# Patient Record
Sex: Male | Born: 1935 | Race: White | Hispanic: No | Smoking: Former smoker
Health system: Southern US, Community
[De-identification: ages and names within clinical notes are randomized; demographics above are authoritative.]

## PROBLEM LIST (undated history)

## (undated) DIAGNOSIS — E785 Hyperlipidemia, unspecified: Secondary | ICD-10-CM

## (undated) DIAGNOSIS — I10 Essential (primary) hypertension: Secondary | ICD-10-CM

## (undated) DIAGNOSIS — R5383 Other fatigue: Secondary | ICD-10-CM

## (undated) DIAGNOSIS — H409 Unspecified glaucoma: Secondary | ICD-10-CM

## (undated) DIAGNOSIS — IMO0001 Reserved for inherently not codable concepts without codable children: Secondary | ICD-10-CM

## (undated) DIAGNOSIS — E1165 Type 2 diabetes mellitus with hyperglycemia: Secondary | ICD-10-CM

## (undated) DIAGNOSIS — N4340 Spermatocele of epididymis, unspecified: Secondary | ICD-10-CM

## (undated) DIAGNOSIS — R5381 Other malaise: Secondary | ICD-10-CM

## (undated) DIAGNOSIS — J301 Allergic rhinitis due to pollen: Secondary | ICD-10-CM

## (undated) DIAGNOSIS — R209 Unspecified disturbances of skin sensation: Secondary | ICD-10-CM

## (undated) DIAGNOSIS — R319 Hematuria, unspecified: Secondary | ICD-10-CM

## (undated) DIAGNOSIS — H8110 Benign paroxysmal vertigo, unspecified ear: Secondary | ICD-10-CM

## (undated) DIAGNOSIS — R109 Unspecified abdominal pain: Secondary | ICD-10-CM

## (undated) DIAGNOSIS — R972 Elevated prostate specific antigen [PSA]: Secondary | ICD-10-CM

## (undated) DIAGNOSIS — M25562 Pain in left knee: Secondary | ICD-10-CM

## (undated) DIAGNOSIS — D7589 Other specified diseases of blood and blood-forming organs: Secondary | ICD-10-CM

## (undated) DIAGNOSIS — N529 Male erectile dysfunction, unspecified: Secondary | ICD-10-CM

## (undated) DIAGNOSIS — N400 Enlarged prostate without lower urinary tract symptoms: Secondary | ICD-10-CM

## (undated) HISTORY — DX: Male erectile dysfunction, unspecified: N52.9

## (undated) HISTORY — DX: Essential (primary) hypertension: I10

## (undated) HISTORY — DX: Pain in left knee: M25.562

## (undated) HISTORY — DX: Unspecified disturbances of skin sensation: R20.9

## (undated) HISTORY — DX: Elevated prostate specific antigen (PSA): R97.20

## (undated) HISTORY — DX: Benign paroxysmal vertigo, unspecified ear: H81.10

## (undated) HISTORY — DX: Other malaise: R53.81

## (undated) HISTORY — DX: Reserved for inherently not codable concepts without codable children: IMO0001

## (undated) HISTORY — PX: TONSILLECTOMY: SHX5217

## (undated) HISTORY — DX: Allergic rhinitis due to pollen: J30.1

## (undated) HISTORY — DX: Other specified diseases of blood and blood-forming organs: D75.89

## (undated) HISTORY — DX: Unspecified glaucoma: H40.9

## (undated) HISTORY — DX: Unspecified abdominal pain: R10.9

## (undated) HISTORY — DX: Hematuria, unspecified: R31.9

## (undated) HISTORY — DX: Other malaise: R53.83

## (undated) HISTORY — DX: Spermatocele of epididymis, unspecified: N43.40

## (undated) HISTORY — DX: Benign prostatic hyperplasia without lower urinary tract symptoms: N40.0

## (undated) HISTORY — DX: Hyperlipidemia, unspecified: E78.5

## (undated) HISTORY — DX: Type 2 diabetes mellitus with hyperglycemia: E11.65

---

## 1993-12-20 HISTORY — PX: PROSTATE BIOPSY: SHX241

## 2002-01-03 ENCOUNTER — Ambulatory Visit (HOSPITAL_COMMUNITY): Admission: RE | Admit: 2002-01-03 | Discharge: 2002-01-03 | Payer: Self-pay | Admitting: Gastroenterology

## 2002-01-03 HISTORY — PX: COLONOSCOPY: SHX174

## 2002-01-03 LAB — HM COLONOSCOPY: HM COLON: NORMAL

## 2006-12-20 HISTORY — PX: CATARACT EXTRACTION EXTRACAPSULAR: SHX1305

## 2012-01-03 DIAGNOSIS — I10 Essential (primary) hypertension: Secondary | ICD-10-CM | POA: Diagnosis not present

## 2012-01-03 DIAGNOSIS — E785 Hyperlipidemia, unspecified: Secondary | ICD-10-CM | POA: Diagnosis not present

## 2012-01-03 DIAGNOSIS — R5381 Other malaise: Secondary | ICD-10-CM | POA: Diagnosis not present

## 2012-01-11 DIAGNOSIS — H409 Unspecified glaucoma: Secondary | ICD-10-CM | POA: Diagnosis not present

## 2012-01-11 DIAGNOSIS — H401290 Low-tension glaucoma, unspecified eye, stage unspecified: Secondary | ICD-10-CM | POA: Diagnosis not present

## 2012-01-11 DIAGNOSIS — H01009 Unspecified blepharitis unspecified eye, unspecified eyelid: Secondary | ICD-10-CM | POA: Diagnosis not present

## 2012-07-10 DIAGNOSIS — E785 Hyperlipidemia, unspecified: Secondary | ICD-10-CM | POA: Diagnosis not present

## 2012-07-18 DIAGNOSIS — Z961 Presence of intraocular lens: Secondary | ICD-10-CM | POA: Diagnosis not present

## 2012-07-18 DIAGNOSIS — H40019 Open angle with borderline findings, low risk, unspecified eye: Secondary | ICD-10-CM | POA: Diagnosis not present

## 2012-07-18 DIAGNOSIS — E785 Hyperlipidemia, unspecified: Secondary | ICD-10-CM | POA: Diagnosis not present

## 2012-07-18 DIAGNOSIS — I10 Essential (primary) hypertension: Secondary | ICD-10-CM | POA: Diagnosis not present

## 2012-07-18 DIAGNOSIS — E119 Type 2 diabetes mellitus without complications: Secondary | ICD-10-CM | POA: Diagnosis not present

## 2012-08-15 DIAGNOSIS — H60399 Other infective otitis externa, unspecified ear: Secondary | ICD-10-CM | POA: Diagnosis not present

## 2012-08-15 DIAGNOSIS — H6120 Impacted cerumen, unspecified ear: Secondary | ICD-10-CM | POA: Diagnosis not present

## 2012-10-13 DIAGNOSIS — Z23 Encounter for immunization: Secondary | ICD-10-CM | POA: Diagnosis not present

## 2013-01-01 DIAGNOSIS — I10 Essential (primary) hypertension: Secondary | ICD-10-CM | POA: Diagnosis not present

## 2013-01-01 DIAGNOSIS — E785 Hyperlipidemia, unspecified: Secondary | ICD-10-CM | POA: Diagnosis not present

## 2013-01-09 DIAGNOSIS — E785 Hyperlipidemia, unspecified: Secondary | ICD-10-CM | POA: Diagnosis not present

## 2013-01-09 DIAGNOSIS — N400 Enlarged prostate without lower urinary tract symptoms: Secondary | ICD-10-CM | POA: Diagnosis not present

## 2013-01-09 DIAGNOSIS — I10 Essential (primary) hypertension: Secondary | ICD-10-CM | POA: Diagnosis not present

## 2013-01-30 DIAGNOSIS — Z961 Presence of intraocular lens: Secondary | ICD-10-CM | POA: Diagnosis not present

## 2013-01-30 DIAGNOSIS — H40019 Open angle with borderline findings, low risk, unspecified eye: Secondary | ICD-10-CM | POA: Diagnosis not present

## 2013-01-30 DIAGNOSIS — H4011X0 Primary open-angle glaucoma, stage unspecified: Secondary | ICD-10-CM | POA: Diagnosis not present

## 2013-01-30 DIAGNOSIS — E119 Type 2 diabetes mellitus without complications: Secondary | ICD-10-CM | POA: Diagnosis not present

## 2013-03-30 DIAGNOSIS — H4011X0 Primary open-angle glaucoma, stage unspecified: Secondary | ICD-10-CM | POA: Diagnosis not present

## 2013-03-30 DIAGNOSIS — H40019 Open angle with borderline findings, low risk, unspecified eye: Secondary | ICD-10-CM | POA: Diagnosis not present

## 2013-04-02 DIAGNOSIS — I10 Essential (primary) hypertension: Secondary | ICD-10-CM | POA: Diagnosis not present

## 2013-04-10 ENCOUNTER — Encounter: Payer: Self-pay | Admitting: Internal Medicine

## 2013-04-10 ENCOUNTER — Other Ambulatory Visit: Payer: Self-pay

## 2013-04-10 ENCOUNTER — Non-Acute Institutional Stay: Payer: Medicare Other | Admitting: Internal Medicine

## 2013-04-10 VITALS — BP 124/62 | HR 80 | Ht 66.0 in | Wt 164.0 lb

## 2013-04-10 DIAGNOSIS — H8110 Benign paroxysmal vertigo, unspecified ear: Secondary | ICD-10-CM | POA: Diagnosis not present

## 2013-04-10 DIAGNOSIS — N400 Enlarged prostate without lower urinary tract symptoms: Secondary | ICD-10-CM

## 2013-04-10 DIAGNOSIS — E785 Hyperlipidemia, unspecified: Secondary | ICD-10-CM

## 2013-04-10 DIAGNOSIS — I10 Essential (primary) hypertension: Secondary | ICD-10-CM | POA: Diagnosis not present

## 2013-04-10 DIAGNOSIS — N529 Male erectile dysfunction, unspecified: Secondary | ICD-10-CM

## 2013-04-10 DIAGNOSIS — E119 Type 2 diabetes mellitus without complications: Secondary | ICD-10-CM | POA: Insufficient documentation

## 2013-04-10 MED ORDER — SAXAGLIPTIN HCL 5 MG PO TABS: ORAL_TABLET | ORAL | Status: DC

## 2013-04-10 NOTE — Progress Notes (Signed)
  Subjective:    Patient ID: George [name], male    DOB: [birth year], 79 y.o.   MRN: [id number]  HPI Patient says he forgot to add the Onglyza to his Actos. He is feeling well. He denies any polydipsia or polyuria.  Type II or unspecified type diabetes mellitus without mention of complication, uncontrolled  Control is worse than in the past. There've been no significant dietary changes according to the patient. Other and unspecified hyperlipidemia  Under good control on present medication Benign paroxysmal positional vertigo  Resolved Unspecified essential hypertension  Under good control Hypertrophy of prostate without urinary obstruction and other lower urinary tract symptoms (LUTS)  Denies polyps with starting or stopping urination. No significant nocturia. No dysuria Impotence of organic origin  Unchanged    Review of Systems  Constitutional: Negative for fever, chills, diaphoresis, activity change, appetite change, fatigue and unexpected weight change.  HENT: Positive for congestion and rhinorrhea. Negative for hearing loss, ear pain, nosebleeds, facial swelling, sneezing, drooling, mouth sores, neck pain, neck stiffness, dental problem, sinus pressure, tinnitus and ear discharge.   Eyes:       Prescription lenses, otherwise normal.  Respiratory: Positive for apnea. Negative for cough, choking, chest tightness, shortness of breath, wheezing and stridor.   Cardiovascular: Negative for chest pain, palpitations and leg swelling.  Gastrointestinal: Negative for nausea, abdominal pain, diarrhea, constipation, blood in stool and abdominal distention.  Endocrine:       History diabetes mellitus on oral medications.  Genitourinary: Negative.   Musculoskeletal: Negative.   Skin: Negative.   Neurological: Negative for dizziness, tremors, seizures, speech difficulty, weakness, light-headedness, numbness and headaches.  Hematological: Negative.   Psychiatric/Behavioral: Negative.        Objective:   Physical Exam   Laboratory reports 09/17/2010 HA1C 6.4 PSA 5.31  BMP Glucose 135 Bun 21 Creatinine 0.9   01/22/2011 CBC: WBC 6.2, RBC 4.19, Hemoglobin 13.7 BMP: GLucose 120, BUN 22, Creatinine 0.74 Lipid: Cholesterol 124, Triglycerides 90, HDL 45, VLDL 18,LDL 61 TSH: 2.773 HA1C: 7.0 PSA: 7.62 06/07/2011  CMP: glucose 123, BUN 21, Creatinine 0.71 Lipid: Cholesterol 135, Triglycerides 91, HDL 46, LDL 71 TSH: 1.999 N8G: 7.3 PSA: 3.74 U/A: normal  09/27/2011 BMP: glucose 151, BUN 22, Creatinine 0.78 HgbA1c 7.1 01/03/2012 CMP: glucose 142, BUN 21, Creatinine 0.75 Lipid: cholesterol 121, triglycerides 82, HDL 48, LDL 57 TSH 2.355 HgbA1c 7.1 Microalbumin-urine 1.04 01/01/2013 CMP: Sodium 142, Potassium 4.4, glucose 136, BUN 0.22, Creatinine 0.75 Lipid: cholesterol 144, triglyceride 95, HDL 46, LDL 79  HgbA1c 7.5 Microalbumin 1.20  01/09/13 EKG: rate 71. NSR. 04/02/13 BMP: Normal except glucose 125  Hemoglobin A1c 8.1    Assessment & Plan:  Type II or unspecified type diabetes mellitus without mention of complication, uncontrolled  Resume Onglyza. Continue Actos. Other and unspecified hyperlipidemia  No change in medication Benign paroxysmal positional vertigo  Resolved Unspecified essential hypertension  Well-controlled on current medication Hypertrophy of prostate without urinary obstruction and other lower urinary tract symptoms (LUTS)  No significant symptoms at this time Impotence of organic origin  Chronic issue. No new medications.  Return in 3 months: Office visit, BMP, A1c

## 2013-04-24 DIAGNOSIS — H6120 Impacted cerumen, unspecified ear: Secondary | ICD-10-CM | POA: Diagnosis not present

## 2013-07-10 ENCOUNTER — Encounter: Payer: Self-pay | Admitting: Internal Medicine

## 2013-07-10 ENCOUNTER — Non-Acute Institutional Stay: Payer: Medicare Other | Admitting: Internal Medicine

## 2013-07-10 VITALS — BP 114/58 | HR 72 | Ht 66.0 in | Wt 161.0 lb

## 2013-07-10 DIAGNOSIS — I10 Essential (primary) hypertension: Secondary | ICD-10-CM

## 2013-07-10 DIAGNOSIS — H8110 Benign paroxysmal vertigo, unspecified ear: Secondary | ICD-10-CM | POA: Diagnosis not present

## 2013-07-10 DIAGNOSIS — E785 Hyperlipidemia, unspecified: Secondary | ICD-10-CM

## 2013-07-10 NOTE — Progress Notes (Signed)
Subjective:    Patient ID: George [name], male    DOB: 1924-08-14, 79 y.o.   MRN: [id number]  HPI  Unspecified essential hypertension: controlled  Type II or unspecified type diabetes mellitus without mention of complication, uncontrolled: improved control. A1c fell from 8.1 to 7.1. Toleeratiing meds.  Benign paroxysmal positional vertigo: Last attack about march 2014  Other and unspecified hyperlipidemia: needs recheck prior to next visit    Current Outpatient Prescriptions on File Prior to Visit  Medication Sig Dispense Refill  . aspirin 81 MG tablet Take 81 mg by mouth daily.      . pioglitazone (ACTOS) 45 MG tablet Take 45 mg by mouth daily. To control blood sugar      . saxagliptin HCl (ONGLYZA) 5 MG TABS tablet Take one tablet daily to control diabetes  30 tablet  5  . tadalafil (CIALIS) 20 MG tablet Take 20 mg by mouth. Take one prior to intercouse            Review of Systems  Constitutional: Negative for fever, chills, diaphoresis, activity change, appetite change, fatigue and unexpected weight change.  HENT: Positive for congestion and rhinorrhea. Negative for hearing loss, ear pain, nosebleeds, facial swelling, sneezing, drooling, mouth sores, neck pain, neck stiffness, dental problem, sinus pressure, tinnitus and ear discharge.   Eyes:       Prescription lenses, otherwise normal.  Respiratory: Positive for apnea. Negative for cough, choking, chest tightness, shortness of breath, wheezing and stridor.   Cardiovascular: Negative for chest pain, palpitations and leg swelling.  Gastrointestinal: Negative for nausea, abdominal pain, diarrhea, constipation, blood in stool and abdominal distention.  Endocrine:       History diabetes mellitus on oral medications.  Genitourinary: Negative.   Musculoskeletal: Negative.   Skin: Negative.   Neurological: Negative for dizziness, tremors, seizures, speech difficulty, weakness, light-headedness, numbness and headaches.   Hematological: Negative.   Psychiatric/Behavioral: Negative.        Objective:BP 114/58  Pulse 72  Ht 5\' 6"  (1.676 m)  Wt 161 lb (73.029 kg)  BMI 26 kg/m2    Physical Exam  Constitutional: He is oriented to person, place, and time. He appears well-developed and well-nourished.  HENT:  Head: Normocephalic and atraumatic.  Right Ear: External ear normal.  Left Ear: External ear normal.  Nose: Nose normal.  Eyes:  Corrective lenses. Bilateral lens implants.   Neck: Normal range of motion. Neck supple. No JVD present. No tracheal deviation present. No thyromegaly present.  Cardiovascular: Normal rate, regular rhythm, normal heart sounds and intact distal pulses.  Exam reveals no gallop and no friction rub.   No murmur heard. Pulmonary/Chest: Effort normal and breath sounds normal. No respiratory distress. He has no wheezes. He has no rales. He exhibits no tenderness.  Abdominal: Soft. Bowel sounds are normal. He exhibits no distension and no mass. There is no tenderness.  Musculoskeletal: Normal range of motion. He exhibits no edema and no tenderness.  Lymphadenopathy:    He has no cervical adenopathy.  Neurological: He is alert and oriented to person, place, and time. No cranial nerve deficit. Coordination normal.  Skin: No rash noted. No erythema. No pallor.  Psychiatric: He has a normal mood and affect. His behavior is normal. Judgment and thought content normal.     LAB REVIEW 01/01/2013 CMP: Sodium 142, Potassium 4.4, glucose 136, BUN 0.22, Creatinine 0.75 Lipid: cholesterol 144, triglyceride 95, HDL 46, LDL 79   HgbA1c 7.5 Microalbumin 1.20   01/09/13 EKG: rate  71. NSR. 04/02/13 BMP: Normal except glucose 125             Hemoglobin A1c 8.1 07/02/13 BMP: Glu 127,   A1c 7.1     Assessment & Plan:  Unspecified essential hypertension: controlled  Type II or unspecified type diabetes mellitus without mention of complication, uncontrolled; imporoved  Benign paroxysmal  positional vertigo: no recent attacks  Other and unspecified hyperlipidemia: recheck

## 2013-07-10 NOTE — Patient Instructions (Signed)
Continue current medications. 

## 2013-07-20 ENCOUNTER — Encounter: Payer: Self-pay | Admitting: Internal Medicine

## 2013-08-27 DIAGNOSIS — H6120 Impacted cerumen, unspecified ear: Secondary | ICD-10-CM | POA: Diagnosis not present

## 2013-09-20 DIAGNOSIS — Z23 Encounter for immunization: Secondary | ICD-10-CM | POA: Diagnosis not present

## 2013-10-03 ENCOUNTER — Other Ambulatory Visit: Payer: Self-pay | Admitting: Internal Medicine

## 2013-11-20 DIAGNOSIS — H6120 Impacted cerumen, unspecified ear: Secondary | ICD-10-CM | POA: Diagnosis not present

## 2013-12-26 DIAGNOSIS — H608X9 Other otitis externa, unspecified ear: Secondary | ICD-10-CM | POA: Diagnosis not present

## 2013-12-26 DIAGNOSIS — H6120 Impacted cerumen, unspecified ear: Secondary | ICD-10-CM | POA: Diagnosis not present

## 2014-01-14 DIAGNOSIS — E785 Hyperlipidemia, unspecified: Secondary | ICD-10-CM | POA: Diagnosis not present

## 2014-01-14 DIAGNOSIS — I10 Essential (primary) hypertension: Secondary | ICD-10-CM | POA: Diagnosis not present

## 2014-01-14 DIAGNOSIS — E119 Type 2 diabetes mellitus without complications: Secondary | ICD-10-CM | POA: Diagnosis not present

## 2014-01-14 LAB — HEPATIC FUNCTION PANEL
ALK PHOS: 75 U/L (ref 25–125)
ALT: 10 U/L (ref 10–40)
AST: 15 U/L (ref 14–40)
BILIRUBIN, TOTAL: 1 mg/dL

## 2014-01-14 LAB — BASIC METABOLIC PANEL
BUN: 27 mg/dL — AB (ref 4–21)
CREATININE: 0.7 mg/dL (ref 0.6–1.3)
GLUCOSE: 153 mg/dL
Potassium: 3.9 mmol/L (ref 3.4–5.3)
Sodium: 139 mmol/L (ref 137–147)

## 2014-01-14 LAB — LIPID PANEL
Cholesterol: 128 mg/dL (ref 0–200)
HDL: 49 mg/dL (ref 35–70)
LDL CALC: 58 mg/dL
Triglycerides: 194 mg/dL — AB (ref 40–160)

## 2014-01-22 ENCOUNTER — Encounter: Payer: Self-pay | Admitting: Internal Medicine

## 2014-01-22 ENCOUNTER — Non-Acute Institutional Stay: Payer: Medicare Other | Admitting: Internal Medicine

## 2014-01-22 VITALS — BP 118/66 | HR 80 | Ht 64.0 in | Wt 160.0 lb

## 2014-01-22 DIAGNOSIS — E785 Hyperlipidemia, unspecified: Secondary | ICD-10-CM

## 2014-01-22 DIAGNOSIS — E1165 Type 2 diabetes mellitus with hyperglycemia: Secondary | ICD-10-CM

## 2014-01-22 DIAGNOSIS — I10 Essential (primary) hypertension: Secondary | ICD-10-CM

## 2014-01-22 DIAGNOSIS — N529 Male erectile dysfunction, unspecified: Secondary | ICD-10-CM

## 2014-01-22 DIAGNOSIS — H9190 Unspecified hearing loss, unspecified ear: Secondary | ICD-10-CM | POA: Diagnosis not present

## 2014-01-22 DIAGNOSIS — IMO0001 Reserved for inherently not codable concepts without codable children: Secondary | ICD-10-CM | POA: Diagnosis not present

## 2014-01-22 DIAGNOSIS — N400 Enlarged prostate without lower urinary tract symptoms: Secondary | ICD-10-CM

## 2014-01-22 MED ORDER — SAXAGLIPTIN HCL 5 MG PO TABS: ORAL_TABLET | ORAL | Status: DC

## 2014-01-22 NOTE — Progress Notes (Signed)
Patient ID: George [name], male   DOB: June 14, 1924, 80 y.o.   MRN: [id number]    Nursing Home Location:  [place] of Service: Clinic (12)  PCP: Estill Dooms, MD  Code Status: LIVING WILL  Allergies  Allergen Reactions  . Metformin And Related   . Sulfa Antibiotics   . Tetanus Toxoids   . Typhoid Vaccines     Chief Complaint  Patient presents with  . Medical Managment of Chronic Issues    Comprehensive Exam: blood pressure, blood sugar, cholesterol     HPI:  Unspecified essential hypertension: controlled  Type II or unspecified type diabetes mellitus without mention of complication, uncontrolled: poor control  Deaf: bilateral loss of hearing  Other and unspecified hyperlipidemia:controlled  Impotence of organic origin: improved. No longer needs to use Viagra.  Hypertrophy of prostate without urinary obstruction and other lower urinary tract symptoms (LUTS): asymptomatic      Past Medical History  Diagnosis Date  . Type II or unspecified type diabetes mellitus without mention of complication, uncontrolled   . Other and unspecified hyperlipidemia   . Unspecified glaucoma   . Benign paroxysmal positional vertigo   . Unspecified essential hypertension   . Allergic rhinitis due to pollen   . Hematuria, unspecified   . Hypertrophy of prostate without urinary obstruction and other lower urinary tract symptoms (LUTS)   . Impotence of organic origin   . Spermatocele     bilateral  . Other malaise and fatigue   . Disturbance of skin sensation     left great toe  . Abdominal pain, other specified site   . Elevated prostate specific antigen (PSA)     Past Surgical History  Procedure Laterality Date  . Tonsillectomy    . Cataract extraction extracapsular  2008    bilateraly Dr. Katy Fitch  . Colonoscopy  01/03/2002    normal Dr. Earlean Shawl  . Prostate biopsy  1995    due to elevated PSA normal    CONSULTANTS Ophth: [facility] GI: [facility] Urol:  [facility] Derm: [place] ENT: [place]  PAST PROCEDURES 1995 Prostate biopsy due to elevated PSA: normal result 1996 Flex sigmoidoscopy: nl 01/03/02 Colonoscopy ([facility]): normal  Social History: History   Social History  . Marital Status: Single    Spouse Name: N/A    Number of Children: N/A  . Years of Education: N/A   Occupational History  . retired, self employed    Social History Main Topics  . Smoking status: Former Smoker    Quit date: 04/09/1948  . Smokeless tobacco: Never Used  . Alcohol Use: No  . Drug Use: No  . Sexual Activity: Yes   Other Topics Concern  . None   Social History Narrative   [facility] at [facility]    Married    Living Will    Family History Family Status  Relation Status Death Age  . Mother Deceased 67    unknown  . Father Deceased 43  . Brother Deceased   . Daughter Alive   . Son Deceased 48-   Family History  Problem Relation Age of Onset  . Cancer Father     lung  . Cancer Brother     adrenal gland  . Cancer Son     liver     Medications: Patient's Medications  New Prescriptions   No medications on file  Previous Medications   ASPIRIN 81 MG TABLET    Take 81 mg by mouth  daily.   PIOGLITAZONE (ACTOS) 45 MG TABLET    TAKE 1 TABLET BY MOUTH EVERY DAY TO CONTROL BLOOD SUGAR   SAXAGLIPTIN HCL (ONGLYZA) 5 MG TABS TABLET    Take one tablet daily to control diabetes   TADALAFIL (CIALIS) 20 MG TABLET    Take 20 mg by mouth. Take one prior to intercouse  Modified Medications   No medications on file  Discontinued Medications   No medications on file    Immunization History  Administered Date(s) Administered  . Influenza Whole 09/19/2012, 09/20/2013  . Pneumococcal Polysaccharide-23 10/24/2001     Review of Systems  Constitutional: Negative for fever, chills, diaphoresis, activity change, appetite change, fatigue and unexpected weight change.  HENT: Positive for congestion and rhinorrhea. Negative for dental problem,  drooling, ear discharge, ear pain, facial swelling, hearing loss, mouth sores, nosebleeds, sinus pressure, sneezing and tinnitus.   Eyes:       Prescription lenses, otherwise normal.  Respiratory: Positive for apnea. Negative for cough, choking, chest tightness, shortness of breath, wheezing and stridor.   Cardiovascular: Negative for chest pain, palpitations and leg swelling.  Gastrointestinal: Negative for nausea, abdominal pain, diarrhea, constipation, blood in stool and abdominal distention.  Endocrine:       History diabetes mellitus on oral medications.  Genitourinary: Negative.   Musculoskeletal: Negative.  Negative for neck pain and neck stiffness.  Skin: Negative.   Neurological: Negative for dizziness, tremors, seizures, speech difficulty, weakness, light-headedness, numbness and headaches.  Hematological: Negative.   Psychiatric/Behavioral: Negative.       Filed Vitals:   01/22/14 1044  BP: 118/66  Pulse: 80  Height: 5\' 4"  (1.626 m)  Weight: 160 lb (72.576 kg)   Physical Exam  Constitutional: He is oriented to person, place, and time. He appears well-developed and well-nourished. No distress.  HENT:  Right Ear: External ear normal.  Left Ear: External ear normal.  Nose: Nose normal.  Eyes: Conjunctivae and EOM are normal. Pupils are equal, round, and reactive to light.  Corrective lens and bilateral implants.  Neck: No JVD present. No tracheal deviation present. No thyromegaly present.  Cardiovascular: Normal rate, regular rhythm, normal heart sounds and intact distal pulses.  Exam reveals no gallop and no friction rub.   No murmur heard. Respiratory: No respiratory distress. He has no wheezes. He has no rales. He exhibits no tenderness.  GI: He exhibits no distension and no mass. There is no tenderness.  Genitourinary: Rectum normal, prostate normal and penis normal. Guaiac negative stool.  Musculoskeletal: Normal range of motion. He exhibits no edema and no  tenderness.  Lymphadenopathy:    He has no cervical adenopathy.  Neurological: He is alert and oriented to person, place, and time. He has normal reflexes. No cranial nerve deficit.  Skin: Skin is warm and dry. No rash noted. No erythema. No pallor.  Psychiatric: He has a normal mood and affect. His behavior is normal. Judgment and thought content normal.       Labs reviewed: Nursing Home on 01/22/2014  Component Date Value Range Status  . HM Colonoscopy 01/03/2002 normal Dr. Earlean Shawl   Final  . Glucose 01/14/2014 153   Final  . BUN 01/14/2014 27* 4 - 21 mg/dL Final  . Creatinine 01/14/2014 0.7  0.6 - 1.3 mg/dL Final  . Potassium 01/14/2014 3.9  3.4 - 5.3 mmol/L Final  . Sodium 01/14/2014 139  137 - 147 mmol/L Final  . Triglycerides 01/14/2014 194* 40 - 160 mg/dL Final  . Cholesterol  01/14/2014 128  0 - 200 mg/dL Final  . HDL 01/14/2014 49  35 - 70 mg/dL Final  . LDL Cholesterol 01/14/2014 58   Final  . Alkaline Phosphatase 01/14/2014 75  25 - 125 U/L Final  . ALT 01/14/2014 10  10 - 40 U/L Final  . AST 01/14/2014 15  14 - 40 U/L Final  . Bilirubin, Total 01/14/2014 1.0   Final      Assessment/Plan 1. Unspecified essential hypertension controlled  2. Type II or unspecified type diabetes mellitus without mention of complication, uncontrolled Poor control. Resume Onglyza. Continue Actos. - saxagliptin HCl (ONGLYZA) 5 MG TABS tablet; Take one tablet daily to control diabetes  Dispense: 90 tablet; Refill: 3  3. Deaf Continue hearing aids  4. Other and unspecified hyperlipidemia controlled  5. Impotence of organic origin improved  6. Hypertrophy of prostate without urinary obstruction and other lower urinary tract symptoms (LUTS) asymptomatic

## 2014-02-13 ENCOUNTER — Encounter: Payer: Self-pay | Admitting: Internal Medicine

## 2014-05-20 DIAGNOSIS — IMO0001 Reserved for inherently not codable concepts without codable children: Secondary | ICD-10-CM | POA: Diagnosis not present

## 2014-05-20 LAB — BASIC METABOLIC PANEL
BUN: 19 mg/dL (ref 4–21)
Creatinine: 0.7 mg/dL (ref 0.6–1.3)
Glucose: 136 mg/dL
Potassium: 4.2 mmol/L (ref 3.4–5.3)
SODIUM: 138 mmol/L (ref 137–147)

## 2014-05-20 LAB — HEMOGLOBIN A1C: Hgb A1c MFr Bld: 7.4 % — AB (ref 4.0–6.0)

## 2014-05-28 ENCOUNTER — Encounter: Payer: Self-pay | Admitting: Internal Medicine

## 2014-05-28 ENCOUNTER — Non-Acute Institutional Stay: Payer: Medicare Other | Admitting: Internal Medicine

## 2014-05-28 VITALS — BP 124/64 | HR 76 | Wt 159.0 lb

## 2014-05-28 DIAGNOSIS — E1165 Type 2 diabetes mellitus with hyperglycemia: Principal | ICD-10-CM

## 2014-05-28 DIAGNOSIS — H8110 Benign paroxysmal vertigo, unspecified ear: Secondary | ICD-10-CM | POA: Diagnosis not present

## 2014-05-28 DIAGNOSIS — I10 Essential (primary) hypertension: Secondary | ICD-10-CM

## 2014-05-28 DIAGNOSIS — IMO0001 Reserved for inherently not codable concepts without codable children: Secondary | ICD-10-CM | POA: Diagnosis not present

## 2014-05-28 DIAGNOSIS — M25569 Pain in unspecified knee: Secondary | ICD-10-CM

## 2014-05-28 DIAGNOSIS — M25562 Pain in left knee: Secondary | ICD-10-CM

## 2014-05-28 DIAGNOSIS — E785 Hyperlipidemia, unspecified: Secondary | ICD-10-CM

## 2014-05-28 DIAGNOSIS — N529 Male erectile dysfunction, unspecified: Secondary | ICD-10-CM

## 2014-05-28 HISTORY — DX: Pain in left knee: M25.562

## 2014-05-28 MED ORDER — NAPROXEN SODIUM 220 MG PO TABS: ORAL_TABLET | ORAL | Status: DC

## 2014-05-28 MED ORDER — SITAGLIPTIN PHOSPHATE 50 MG PO TABS: ORAL_TABLET | ORAL | Status: DC

## 2014-05-28 MED ORDER — TADALAFIL 20 MG PO TABS: ORAL_TABLET | ORAL | Status: DC

## 2014-05-28 NOTE — Progress Notes (Signed)
Patient ID: George [name], male   DOB: 01-12-1924, 80 y.o.   MRN: [id number]    Location:  [facility]   Place of Service: Clinic (12)    Allergies  Allergen Reactions  . Metformin And Related   . Sulfa Antibiotics   . Tetanus Toxoids   . Typhoid Vaccines     Chief Complaint  Patient presents with  . Medical Management of Chronic Issues    blood pressure, blood sugar. c/o feels light headed, feels blood sugar unstable, doesn't check BS at home afraid to. Been out of Onglyza for a month.  . Medication Refill    would like Rx for Cialis 20mg  again.    HPI:  Type II or unspecified type diabetes mellitus without mention of complication, uncontrolled: not using Onglyza as directed. Possible trouble getting approval from insurance complay  Other and unspecified hyperlipidemia: controlled  Unspecified essential hypertension: controlled  Benign paroxysmal positional vertigo: some lightheaded dizziness. No true vertigo. No nausea, change in vision , or other neurologic disturbance.  Left knee pain noted about 3 months ago. Sometimes hurts at night. When the right knee rests on it there is discomfort that wakes him up. Has not tried any OTC pain relievers.  Medications: Patient's Medications  New Prescriptions   No medications on file  Previous Medications   ASPIRIN 81 MG TABLET    Take 81 mg by mouth daily.   PIOGLITAZONE (ACTOS) 45 MG TABLET    TAKE 1 TABLET BY MOUTH EVERY DAY TO CONTROL BLOOD SUGAR   SAXAGLIPTIN HCL (ONGLYZA) 5 MG TABS TABLET    Take one tablet daily to control diabetes  Modified Medications   No medications on file  Discontinued Medications   No medications on file     Review of Systems  Constitutional: Negative for fever, chills, diaphoresis, activity change, appetite change, fatigue and unexpected weight change.  HENT: Negative for congestion, dental problem, drooling, ear discharge, ear pain, facial swelling, hearing loss, mouth sores,  nosebleeds, rhinorrhea, sinus pressure, sneezing and tinnitus.   Eyes:       Prescription lenses, otherwise normal.  Respiratory: Negative for apnea, cough, choking, chest tightness, shortness of breath, wheezing and stridor.   Cardiovascular: Negative for chest pain, palpitations and leg swelling.  Gastrointestinal: Negative for nausea, abdominal pain, diarrhea, constipation, blood in stool and abdominal distention.  Endocrine:       History diabetes mellitus on oral medications.  Genitourinary: Negative.   Musculoskeletal: Negative.  Negative for neck pain and neck stiffness.  Skin: Negative.   Neurological: Negative for dizziness, tremors, seizures, speech difficulty, weakness, light-headedness, numbness and headaches.  Hematological: Negative.   Psychiatric/Behavioral: Negative.     Filed Vitals:   05/28/14 0845  BP: 124/64  Pulse: 76  Weight: 159 lb (72.122 kg)   Body mass index is 27.28 kg/(m^2).  Physical Exam  Constitutional: He is oriented to person, place, and time. He appears well-developed and well-nourished.  HENT:  Head: Normocephalic and atraumatic.  Right Ear: External ear normal.  Left Ear: External ear normal.  Nose: Nose normal.  Eyes:  Corrective lenses. Bilateral lens implants.   Neck: Normal range of motion. Neck supple. No JVD present. No tracheal deviation present. No thyromegaly present.  Cardiovascular: Normal rate, regular rhythm, normal heart sounds and intact distal pulses.  Exam reveals no gallop and no friction rub.   No murmur heard. Pulmonary/Chest: Effort normal and breath sounds normal. No respiratory distress. He has no wheezes. He has  no rales. He exhibits no tenderness.  Abdominal: Soft. Bowel sounds are normal. He exhibits no distension and no mass. There is no tenderness.  Musculoskeletal: Normal range of motion. He exhibits no edema and no tenderness.  Lymphadenopathy:    He has no cervical adenopathy.  Neurological: He is alert and  oriented to person, place, and time. No cranial nerve deficit. Coordination normal.  Skin: No rash noted. No erythema. No pallor.  Psychiatric: He has a normal mood and affect. His behavior is normal. Judgment and thought content normal.     Labs reviewed: Nursing Home on 05/28/2014  Component Date Value Ref Range Status  . Glucose 05/20/2014 136   Final  . BUN 05/20/2014 19  4 - 21 mg/dL Final  . Creatinine 05/20/2014 0.7  0.6 - 1.3 mg/dL Final  . Potassium 05/20/2014 4.2  3.4 - 5.3 mmol/L Final  . Sodium 05/20/2014 138  137 - 147 mmol/L Final  . Hemoglobin A1C 05/20/2014 7.4* 4.0 - 6.0 % Final      Assessment/Plan  1. Type II or unspecified type diabetes mellitus without mention of complication, uncontrolled Continue Actos. Add- sitaGLIPtin (JANUVIA) 50 MG tablet; One daily to control diabetes  Dispense: 90 tablet; Refill: 3  2. Other and unspecified hyperlipidemia controlled  3. Unspecified essential hypertension controlled  4. Benign paroxysmal positional vertigo No true vertigo. Lightheaded feeling.  5. Impotence of organic origin - tadalafil (CIALIS) 20 MG tablet; One tablet prior to intercourse  Dispense: 6 tablet; Refill: 5  6. Left knee pain - naproxen sodium (ALEVE) 220 MG tablet; One tablet up to 3 times daily for knee pains  Dispense: 100 tablet; Refill: 0

## 2014-06-04 ENCOUNTER — Encounter: Payer: Self-pay | Admitting: Internal Medicine

## 2014-07-17 ENCOUNTER — Encounter: Payer: Self-pay | Admitting: Internal Medicine

## 2014-07-17 DIAGNOSIS — H4011X0 Primary open-angle glaucoma, stage unspecified: Secondary | ICD-10-CM | POA: Diagnosis not present

## 2014-07-17 DIAGNOSIS — Z961 Presence of intraocular lens: Secondary | ICD-10-CM | POA: Diagnosis not present

## 2014-07-17 DIAGNOSIS — E119 Type 2 diabetes mellitus without complications: Secondary | ICD-10-CM | POA: Diagnosis not present

## 2014-07-17 DIAGNOSIS — H40019 Open angle with borderline findings, low risk, unspecified eye: Secondary | ICD-10-CM | POA: Diagnosis not present

## 2014-07-17 LAB — HM DIABETES EYE EXAM

## 2014-09-30 DIAGNOSIS — I10 Essential (primary) hypertension: Secondary | ICD-10-CM | POA: Diagnosis not present

## 2014-09-30 DIAGNOSIS — E785 Hyperlipidemia, unspecified: Secondary | ICD-10-CM | POA: Diagnosis not present

## 2014-09-30 DIAGNOSIS — E119 Type 2 diabetes mellitus without complications: Secondary | ICD-10-CM | POA: Diagnosis not present

## 2014-09-30 LAB — BASIC METABOLIC PANEL
BUN: 20 mg/dL (ref 4–21)
CREATININE: 0.7 mg/dL (ref 0.6–1.3)
Glucose: 172 mg/dL
Potassium: 4.3 mmol/L (ref 3.4–5.3)
Sodium: 140 mmol/L (ref 137–147)

## 2014-09-30 LAB — LIPID PANEL
Cholesterol: 123 mg/dL (ref 0–200)
HDL: 47 mg/dL (ref 35–70)
LDL CALC: 59 mg/dL
LDL/HDL RATIO: 2.6
TRIGLYCERIDES: 87 mg/dL (ref 40–160)

## 2014-09-30 LAB — HEMOGLOBIN A1C: HEMOGLOBIN A1C: 7.4 % — AB (ref 4.0–6.0)

## 2014-10-05 DIAGNOSIS — Z23 Encounter for immunization: Secondary | ICD-10-CM | POA: Diagnosis not present

## 2014-10-08 ENCOUNTER — Non-Acute Institutional Stay: Payer: Medicare Other | Admitting: Internal Medicine

## 2014-10-08 ENCOUNTER — Encounter: Payer: Self-pay | Admitting: Internal Medicine

## 2014-10-08 VITALS — BP 128/68 | HR 80 | Temp 97.7°F | Wt 161.0 lb

## 2014-10-08 DIAGNOSIS — I10 Essential (primary) hypertension: Secondary | ICD-10-CM

## 2014-10-08 DIAGNOSIS — E119 Type 2 diabetes mellitus without complications: Secondary | ICD-10-CM | POA: Diagnosis not present

## 2014-10-08 MED ORDER — SITAGLIPTIN PHOSPHATE 100 MG PO TABS: ORAL_TABLET | ORAL | Status: DC

## 2014-10-08 NOTE — Progress Notes (Signed)
Patient ID: George [name], male   DOB: 1924/08/06, 80 y.o.   MRN: [id number]    [place] of Service: Clinic (12)    Allergies  Allergen Reactions  . Metformin And Related   . Sulfa Antibiotics   . Tetanus Toxoids   . Typhoid Vaccines     Chief Complaint  Patient presents with  . Medical Management of Chronic Issues    blood sugar, blood pressure, cholesterol.  Does not check blood sugars at home.    HPI:  Type 2 diabetes mellitus without complication: glucose running high. Asymptomatic.  HTN: Controlled  Sometimes has a lightheaded feeling. "Dizzy". Not vertigenous.    Medications: Patient's Medications  New Prescriptions   No medications on file  Previous Medications   ASPIRIN 81 MG TABLET    Take 81 mg by mouth daily.   PIOGLITAZONE (ACTOS) 45 MG TABLET    TAKE 1 TABLET BY MOUTH EVERY DAY TO CONTROL BLOOD SUGAR   SITAGLIPTIN (JANUVIA) 50 MG TABLET    One daily to control diabetes   TADALAFIL (CIALIS) 20 MG TABLET    One tablet prior to intercourse  Modified Medications   No medications on file  Discontinued Medications   NAPROXEN SODIUM (ALEVE) 220 MG TABLET    One tablet up to 3 times daily for knee pains     Review of Systems  Constitutional: Negative for fever, chills, diaphoresis, activity change, appetite change, fatigue and unexpected weight change.  HENT: Negative for congestion, dental problem, drooling, ear discharge, ear pain, facial swelling, hearing loss, mouth sores, nosebleeds, rhinorrhea, sinus pressure, sneezing and tinnitus.   Eyes:       Prescription lenses, otherwise normal.  Respiratory: Negative for apnea, cough, choking, chest tightness, shortness of breath, wheezing and stridor.   Cardiovascular: Negative for chest pain, palpitations and leg swelling.  Gastrointestinal: Negative for nausea, abdominal pain, diarrhea, constipation, blood in stool and abdominal distention.  Endocrine:       History  diabetes mellitus on oral medications.  Genitourinary: Negative.   Musculoskeletal: Negative.  Negative for neck pain and neck stiffness.  Skin: Negative.   Neurological: Negative for dizziness, tremors, seizures, speech difficulty, weakness, light-headedness, numbness and headaches.  Hematological: Negative.   Psychiatric/Behavioral: Negative.     Filed Vitals:   10/08/14 0840  BP: 128/68  Pulse: 80  Temp: 97.7 F (36.5 C)  TempSrc: Oral  Weight: 161 lb (73.029 kg)   Body mass index is 27.62 kg/(m^2).  Physical Exam  Constitutional: He is oriented to person, place, and time. He appears well-developed and well-nourished.  HENT:  Head: Normocephalic and atraumatic.  Right Ear: External ear normal.  Left Ear: External ear normal.  Nose: Nose normal.  Eyes:  Corrective lenses. Bilateral lens implants.   Neck: Normal range of motion. Neck supple. No JVD present. No tracheal deviation present. No thyromegaly present.  Cardiovascular: Normal rate, regular rhythm, normal heart sounds and intact distal pulses.  Exam reveals no gallop and no friction rub.   No murmur heard. Pulmonary/Chest: Effort normal and breath sounds normal. No respiratory distress. He has no wheezes. He has no rales. He exhibits no tenderness.  Abdominal: Soft. Bowel sounds are normal. He exhibits no distension and no mass. There is no tenderness.  Musculoskeletal: Normal range of motion. He exhibits no edema and no tenderness.  Lymphadenopathy:    He has no cervical adenopathy.  Neurological: He is alert and oriented to person, place, and  time. No cranial nerve deficit. Coordination normal.  Skin: No rash noted. No erythema. No pallor.  Psychiatric: He has a normal mood and affect. His behavior is normal. Judgment and thought content normal.     Labs reviewed: Nursing Home on 10/08/2014  Component Date Value Ref Range Status  . Glucose 09/30/2014 172   Final  . BUN 09/30/2014 20  4 - 21 mg/dL Final  .  Creatinine 09/30/2014 0.7  0.6 - 1.3 mg/dL Final  . Potassium 09/30/2014 4.3  3.4 - 5.3 mmol/L Final  . Sodium 09/30/2014 140  137 - 147 mmol/L Final  . LDl/HDL Ratio 09/30/2014 2.6   Final  . Triglycerides 09/30/2014 87  40 - 160 mg/dL Final  . Cholesterol 09/30/2014 123  0 - 200 mg/dL Final  . HDL 09/30/2014 47  35 - 70 mg/dL Final  . LDL Cholesterol 09/30/2014 59   Final  . Hemoglobin A1C 09/30/2014 7.4* 4.0 - 6.0 % Final     Assessment/Plan  1. Type 2 diabetes mellitus without complication - sitaGLIPtin (JANUVIA) 100 MG tablet; One each morning to control diabetes  Dispense: 90 tablet; Refill: 3  2. Essential hypertension controlled

## 2014-10-18 ENCOUNTER — Encounter: Payer: Self-pay | Admitting: Internal Medicine

## 2014-11-07 ENCOUNTER — Other Ambulatory Visit: Payer: Self-pay | Admitting: Nurse Practitioner

## 2014-11-08 DIAGNOSIS — Z85828 Personal history of other malignant neoplasm of skin: Secondary | ICD-10-CM | POA: Diagnosis not present

## 2014-11-08 DIAGNOSIS — D485 Neoplasm of uncertain behavior of skin: Secondary | ICD-10-CM | POA: Diagnosis not present

## 2014-11-08 DIAGNOSIS — L820 Inflamed seborrheic keratosis: Secondary | ICD-10-CM | POA: Diagnosis not present

## 2014-12-30 DIAGNOSIS — H40013 Open angle with borderline findings, low risk, bilateral: Secondary | ICD-10-CM | POA: Diagnosis not present

## 2014-12-30 DIAGNOSIS — E119 Type 2 diabetes mellitus without complications: Secondary | ICD-10-CM | POA: Diagnosis not present

## 2014-12-30 DIAGNOSIS — Z961 Presence of intraocular lens: Secondary | ICD-10-CM | POA: Diagnosis not present

## 2014-12-30 LAB — HM DIABETES EYE EXAM

## 2015-01-20 DIAGNOSIS — E785 Hyperlipidemia, unspecified: Secondary | ICD-10-CM | POA: Diagnosis not present

## 2015-01-20 DIAGNOSIS — E119 Type 2 diabetes mellitus without complications: Secondary | ICD-10-CM | POA: Diagnosis not present

## 2015-01-20 LAB — BASIC METABOLIC PANEL
BUN: 23 mg/dL — AB (ref 4–21)
Creatinine: 0.8 mg/dL (ref 0.6–1.3)
Glucose: 118 mg/dL
POTASSIUM: 4.2 mmol/L (ref 3.4–5.3)
SODIUM: 141 mmol/L (ref 137–147)

## 2015-01-20 LAB — LIPID PANEL
Cholesterol: 137 mg/dL (ref 0–200)
HDL: 50 mg/dL (ref 35–70)
LDL CALC: 71 mg/dL
LDl/HDL Ratio: 2.7
Triglycerides: 80 mg/dL (ref 40–160)

## 2015-01-23 ENCOUNTER — Encounter: Payer: Self-pay | Admitting: *Deleted

## 2015-01-28 ENCOUNTER — Non-Acute Institutional Stay: Payer: Medicare Other | Admitting: Internal Medicine

## 2015-01-28 ENCOUNTER — Encounter: Payer: Self-pay | Admitting: Internal Medicine

## 2015-01-28 VITALS — BP 124/60 | HR 72 | Temp 97.7°F | Wt 160.0 lb

## 2015-01-28 DIAGNOSIS — E785 Hyperlipidemia, unspecified: Secondary | ICD-10-CM

## 2015-01-28 DIAGNOSIS — E119 Type 2 diabetes mellitus without complications: Secondary | ICD-10-CM

## 2015-01-28 DIAGNOSIS — M25562 Pain in left knee: Secondary | ICD-10-CM | POA: Diagnosis not present

## 2015-01-28 DIAGNOSIS — I10 Essential (primary) hypertension: Secondary | ICD-10-CM | POA: Diagnosis not present

## 2015-01-28 DIAGNOSIS — N400 Enlarged prostate without lower urinary tract symptoms: Secondary | ICD-10-CM

## 2015-01-28 NOTE — Progress Notes (Signed)
[facility]    [facility] ([facility] OFFICE   Allergies  Allergen Reactions  . Metformin And Related   . Sulfa Antibiotics   . Tetanus Toxoids   . Typhoid Vaccines     Chief Complaint  Patient presents with  . Medical Management of Chronic Issues    blood pressure, blood sugar    HPI:  Type 2 diabetes mellitus without complication: controlled  Hyperlipidemia: controlled  Essential hypertension: controlled  BPH (benign prostatic hyperplasia): Denies dysuria or frequency  Left knee pain: Mild and not disabling    Medications: Patient's Medications  New Prescriptions   No medications on file  Previous Medications   ASPIRIN 81 MG TABLET    Take 81 mg by mouth daily.   LUTEIN-ZEAXANTHIN 25-5 MG CAPS    Take by mouth. Take one tablet daily, eye  vitamin   PIOGLITAZONE (ACTOS) 45 MG TABLET    TAKE 1 TABLET BY MOUTH EVERY DAY TO CONTROL BLOOD SUGAR   SELENIUM 200 MCG TABS    Take by mouth. Take one tablet daily   SITAGLIPTIN (JANUVIA) 100 MG TABLET    One each morning to control diabetes   TADALAFIL (CIALIS) 20 MG TABLET    One tablet prior to intercourse  Modified Medications   No medications on file  Discontinued Medications   No medications on file     Review of Systems  Constitutional: Negative for fever, chills, diaphoresis, activity change, appetite change, fatigue and unexpected weight change.  HENT: Negative for congestion, dental problem, drooling, ear discharge, ear pain, facial swelling, hearing loss, mouth sores, nosebleeds, rhinorrhea, sinus pressure, sneezing and tinnitus.   Eyes:       Prescription lenses, otherwise normal.  Respiratory: Negative for apnea, cough, choking, chest tightness, shortness of breath, wheezing and stridor.   Cardiovascular: Negative for chest pain, palpitations and leg swelling.  Gastrointestinal: Negative for nausea, abdominal pain, diarrhea, constipation, blood in stool and abdominal  distention.  Endocrine:       History diabetes mellitus on oral medications.  Genitourinary: Negative.   Musculoskeletal: Negative.  Negative for neck pain and neck stiffness.  Skin: Negative.   Neurological: Negative for dizziness, tremors, seizures, speech difficulty, weakness, light-headedness, numbness and headaches.  Hematological: Negative.   Psychiatric/Behavioral: Negative.     Filed Vitals:   01/28/15 0900  BP: 124/60  Pulse: 72  Temp: 97.7 F (36.5 C)  TempSrc: Oral  Weight: 160 lb (72.576 kg)   Body mass index is 27.45 kg/(m^2).  Physical Exam  Constitutional: He is oriented to person, place, and time. He appears well-developed and well-nourished.  HENT:  Head: Normocephalic and atraumatic.  Right Ear: External ear normal.  Left Ear: External ear normal.  Nose: Nose normal.  Eyes:  Corrective lenses. Bilateral lens implants.  Neck: Normal range of motion. Neck supple. No JVD present. No tracheal deviation present. No thyromegaly present.  Cardiovascular: Normal rate, regular rhythm, normal heart sounds and intact distal pulses.  Exam reveals no gallop and no friction rub.   No murmur heard. Pulmonary/Chest: Effort normal and breath sounds normal. No respiratory distress. He has no wheezes. He has no rales. He exhibits no tenderness.  Abdominal: Soft. Bowel sounds are normal. He exhibits no distension and no mass. There is no tenderness.  Musculoskeletal: Normal range of motion. He exhibits no edema or tenderness.  No swelling, focal tenderness, or joint effusion at the left knee. No popliteal cyst.  Lymphadenopathy:  He has no cervical adenopathy.  Neurological: He is alert and oriented to person, place, and time. No cranial nerve deficit. Coordination normal.  Skin: No rash noted. No erythema. No pallor.  Psychiatric: He has a normal mood and affect. His behavior is normal. Judgment and thought content normal.     Labs reviewed: Nursing Home on 01/28/2015    Component Date Value Ref Range Status  . Glucose 01/20/2015 118   Final  . BUN 01/20/2015 23* 4 - 21 mg/dL Final  . Creatinine 01/20/2015 0.8  0.6 - 1.3 mg/dL Final  . Potassium 01/20/2015 4.2  3.4 - 5.3 mmol/L Final  . Sodium 01/20/2015 141  137 - 147 mmol/L Final  . LDl/HDL Ratio 01/20/2015 2.7   Final  . Triglycerides 01/20/2015 80  40 - 160 mg/dL Final  . Cholesterol 01/20/2015 137  0 - 200 mg/dL Final  . HDL 01/20/2015 50  35 - 70 mg/dL Final  . LDL Cholesterol 01/20/2015 71   Final  Abstract on 01/23/2015  Component Date Value Ref Range Status  . HM Diabetic Eye Exam 12/30/2014 No Retinopathy  No Retinopathy Final     Assessment/Plan  1. Type 2 diabetes mellitus without complication -P5W, BMP, URINE MICROALBUMIN  2. Hyperlipidemia -LIPIDS  3. Essential hypertension BMP  4. BPH (benign prostatic hyperplasia) stable  5. Left knee pain Unchanged.

## 2015-03-11 ENCOUNTER — Encounter: Payer: Self-pay | Admitting: Internal Medicine

## 2015-03-28 DIAGNOSIS — L570 Actinic keratosis: Secondary | ICD-10-CM | POA: Diagnosis not present

## 2015-03-28 DIAGNOSIS — Z85828 Personal history of other malignant neoplasm of skin: Secondary | ICD-10-CM | POA: Diagnosis not present

## 2015-03-28 DIAGNOSIS — L821 Other seborrheic keratosis: Secondary | ICD-10-CM | POA: Diagnosis not present

## 2015-07-01 ENCOUNTER — Non-Acute Institutional Stay: Payer: Medicare Other | Admitting: Internal Medicine

## 2015-07-01 ENCOUNTER — Encounter: Payer: Self-pay | Admitting: Internal Medicine

## 2015-07-01 VITALS — BP 120/62 | HR 66 | Temp 97.5°F | Wt 162.0 lb

## 2015-07-01 DIAGNOSIS — M25561 Pain in right knee: Secondary | ICD-10-CM | POA: Diagnosis not present

## 2015-07-01 MED ORDER — CELECOXIB 200 MG PO CAPS: ORAL_CAPSULE | ORAL | Status: DC

## 2015-07-01 NOTE — Progress Notes (Signed)
Patient ID: George [name], male   DOB: 1924/05/17, 81 y.o.   MRN: [id number]    [facility]     Place of Service: Clinic (12)     Allergies  Allergen Reactions  . Metformin And Related   . Sulfa Antibiotics   . Tetanus Toxoids   . Typhoid Vaccines     Chief Complaint  Patient presents with  . Knee Pain    right knee for one week, no injury. Worse at night, wakes him up. Hasn't try anything for the pain    HPI:  Right knee pain. No fall or other injury. No swelling.. Denies crepitance or knee catching or giving way as he walks. Hurts when in bed. Slightly limping as he walks.  Medications: Patient's Medications  New Prescriptions   No medications on file  Previous Medications   ASPIRIN 81 MG TABLET    Take 81 mg by mouth daily.   LUTEIN-ZEAXANTHIN 25-5 MG CAPS    Take by mouth. Take one tablet daily, eye  vitamin   PIOGLITAZONE (ACTOS) 45 MG TABLET    TAKE 1 TABLET BY MOUTH EVERY DAY TO CONTROL BLOOD SUGAR   SELENIUM 200 MCG TABS    Take by mouth. Take one tablet daily   SITAGLIPTIN (JANUVIA) 100 MG TABLET    One each morning to control diabetes   TADALAFIL (CIALIS) 20 MG TABLET    One tablet prior to intercourse  Modified Medications   No medications on file  Discontinued Medications   No medications on file     Review of Systems  Constitutional: Negative for fever, chills, diaphoresis, activity change, appetite change, fatigue and unexpected weight change.  HENT: Negative for congestion, dental problem, drooling, ear discharge, ear pain, facial swelling, hearing loss, mouth sores, nosebleeds, rhinorrhea, sinus pressure, sneezing and tinnitus.   Eyes:       Prescription lenses, otherwise normal.  Respiratory: Negative for apnea, cough, choking, chest tightness, shortness of breath, wheezing and stridor.   Cardiovascular: Negative for chest pain, palpitations and leg swelling.  Gastrointestinal: Negative for nausea, abdominal pain, diarrhea,  constipation, blood in stool and abdominal distention.  Endocrine:       History diabetes mellitus on oral medications.  Genitourinary: Negative.   Musculoskeletal: Negative for neck pain and neck stiffness.       Pain right knee medially. No swelling of the joint.  Skin: Negative.   Neurological: Negative for dizziness, tremors, seizures, speech difficulty, weakness, light-headedness, numbness and headaches.  Hematological: Negative.   Psychiatric/Behavioral: Negative.     Filed Vitals:   07/01/15 0850  BP: 120/62  Pulse: 66  Temp: 97.5 F (36.4 C)  TempSrc: Oral  Weight: 162 lb (73.483 kg)  SpO2: 97%   Body mass index is 27.79 kg/(m^2).  Physical Exam  Constitutional: He is oriented to person, place, and time. He appears well-developed and well-nourished.  HENT:  Head: Normocephalic and atraumatic.  Right Ear: External ear normal.  Left Ear: External ear normal.  Nose: Nose normal.  Eyes:  Corrective lenses. Bilateral lens implants.  Neck: Normal range of motion. Neck supple. No JVD present. No tracheal deviation present. No thyromegaly present.  Cardiovascular: Normal rate, regular rhythm, normal heart sounds and intact distal pulses.  Exam reveals no gallop and no friction rub.   No murmur heard. Pulmonary/Chest: Effort normal and breath sounds normal. No respiratory distress. He has no wheezes. He has no rales. He exhibits no tenderness.  Abdominal: Soft. Bowel sounds are  normal. He exhibits no distension and no mass. There is no tenderness.  Musculoskeletal: Normal range of motion. He exhibits no edema or tenderness.  Tender medially on the right knee. No swelling, popliteal cyst, or laxity when I stress the joint.  Lymphadenopathy:    He has no cervical adenopathy.  Neurological: He is alert and oriented to person, place, and time. No cranial nerve deficit. Coordination normal.  Skin: No rash noted. No erythema. No pallor.  Psychiatric: He has a normal mood and  affect. His behavior is normal. Judgment and thought content normal.     Labs reviewed: No visits with results within 3 Month(s) from this visit. Latest known visit with results is:  Nursing Home on 01/28/2015  Component Date Value Ref Range Status  . Glucose 01/20/2015 118   Final  . BUN 01/20/2015 23* 4 - 21 mg/dL Final  . Creatinine 01/20/2015 0.8  0.6 - 1.3 mg/dL Final  . Potassium 01/20/2015 4.2  3.4 - 5.3 mmol/L Final  . Sodium 01/20/2015 141  137 - 147 mmol/L Final  . LDl/HDL Ratio 01/20/2015 2.7   Final  . Triglycerides 01/20/2015 80  40 - 160 mg/dL Final  . Cholesterol 01/20/2015 137  0 - 200 mg/dL Final  . HDL 01/20/2015 50  35 - 70 mg/dL Final  . LDL Cholesterol 01/20/2015 71   Final     Assessment/Plan  1. Right knee pain - celecoxib (CELEBREX) 200 MG capsule; One daily to help knee pain  Dispense: 30 capsule; Refill: 3

## 2015-07-21 DIAGNOSIS — E785 Hyperlipidemia, unspecified: Secondary | ICD-10-CM | POA: Diagnosis not present

## 2015-07-21 DIAGNOSIS — I10 Essential (primary) hypertension: Secondary | ICD-10-CM | POA: Diagnosis not present

## 2015-07-21 DIAGNOSIS — E119 Type 2 diabetes mellitus without complications: Secondary | ICD-10-CM | POA: Diagnosis not present

## 2015-07-21 LAB — BASIC METABOLIC PANEL
BUN: 18 mg/dL (ref 4–21)
Creatinine: 0.8 mg/dL (ref <=1.3)
GLUCOSE: 133 mg/dL
POTASSIUM: 4.1 mmol/L (ref 3.4–5.3)
Sodium: 141 mmol/L (ref 137–147)

## 2015-07-21 LAB — HEMOGLOBIN A1C: Hgb A1c MFr Bld: 7.2 % — AB (ref 4.0–6.0)

## 2015-07-21 LAB — LIPID PANEL
Cholesterol: 131 mg/dL (ref 0–200)
HDL: 49 mg/dL (ref 35–70)
LDL Cholesterol: 62 mg/dL
LDl/HDL Ratio: 2.7
TRIGLYCERIDES: 98 mg/dL (ref 40–160)

## 2015-07-22 ENCOUNTER — Non-Acute Institutional Stay: Payer: Medicare Other | Admitting: Internal Medicine

## 2015-07-22 ENCOUNTER — Encounter: Payer: Self-pay | Admitting: Internal Medicine

## 2015-07-22 VITALS — BP 110/62 | HR 76 | Temp 97.7°F | Resp 20 | Ht 64.0 in | Wt 162.0 lb

## 2015-07-22 DIAGNOSIS — M25561 Pain in right knee: Secondary | ICD-10-CM

## 2015-07-22 DIAGNOSIS — E119 Type 2 diabetes mellitus without complications: Secondary | ICD-10-CM | POA: Diagnosis not present

## 2015-07-22 DIAGNOSIS — E785 Hyperlipidemia, unspecified: Secondary | ICD-10-CM | POA: Diagnosis not present

## 2015-07-22 DIAGNOSIS — I10 Essential (primary) hypertension: Secondary | ICD-10-CM

## 2015-07-22 NOTE — Progress Notes (Signed)
Patient ID: George [name], male   DOB: [birth year], 81 y.o.   MRN: [id number]    [facility]     Place of Service: Clinic (12)     Allergies  Allergen Reactions  . Metformin And Related   . Sulfa Antibiotics   . Tetanus Toxoids   . Typhoid Vaccines     Chief Complaint  Patient presents with  . Medical Management of Chronic Issues    HPI:  Type 2 diabetes mellitus without complication: Hemoglobin A1c stable at 7.2. No episodes of hypoglycemia or significant hyperglycemia.  Hyperlipidemia - adequately controlled  Right knee pain - improved  Essential hypertension - controlled    Medications: Patient's Medications  New Prescriptions   No medications on file  Previous Medications   ASPIRIN 81 MG TABLET    Take 81 mg by mouth daily.   CELECOXIB (CELEBREX) 200 MG CAPSULE    One daily to help knee pain   LUTEIN-ZEAXANTHIN 25-5 MG CAPS    Take by mouth. Take one tablet daily, eye  vitamin   PIOGLITAZONE (ACTOS) 45 MG TABLET    TAKE 1 TABLET BY MOUTH EVERY DAY TO CONTROL BLOOD SUGAR   SELENIUM 200 MCG TABS    Take by mouth. Take one tablet daily   SITAGLIPTIN (JANUVIA) 100 MG TABLET    One each morning to control diabetes   TADALAFIL (CIALIS) 20 MG TABLET    One tablet prior to intercourse  Modified Medications   No medications on file  Discontinued Medications   No medications on file     Review of Systems  Constitutional: Negative for fever, chills, diaphoresis, activity change, appetite change, fatigue and unexpected weight change.  HENT: Negative for congestion, dental problem, drooling, ear discharge, ear pain, facial swelling, hearing loss, mouth sores, nosebleeds, rhinorrhea, sinus pressure, sneezing and tinnitus.   Eyes:       Prescription lenses, otherwise normal.  Respiratory: Negative for apnea, cough, choking, chest tightness, shortness of breath, wheezing and stridor.   Cardiovascular: Negative for chest pain, palpitations and leg swelling.    Gastrointestinal: Negative for nausea, abdominal pain, diarrhea, constipation, blood in stool and abdominal distention.  Endocrine:       History diabetes mellitus on oral medications.  Genitourinary: Negative.   Musculoskeletal: Negative for neck pain and neck stiffness.  Skin: Negative.   Neurological: Negative for dizziness, tremors, seizures, speech difficulty, weakness, light-headedness, numbness and headaches.  Hematological: Negative.   Psychiatric/Behavioral: The patient is hyperactive.     Filed Vitals:   07/22/15 0845  BP: 110/62  Pulse: 76  Temp: 97.7 F (36.5 C)  TempSrc: Oral  Resp: 20  Height: 5\' 4"  (1.626 m)  Weight: 162 lb (73.483 kg)  SpO2: 95%   Body mass index is 27.79 kg/(m^2).  Physical Exam  Constitutional: He is oriented to person, place, and time. He appears well-developed and well-nourished.  HENT:  Head: Normocephalic and atraumatic.  Right Ear: External ear normal.  Left Ear: External ear normal.  Nose: Nose normal.  Eyes:  Corrective lenses. Bilateral lens implants.  Neck: Normal range of motion. Neck supple. No JVD present. No tracheal deviation present. No thyromegaly present.  Cardiovascular: Normal rate, regular rhythm, normal heart sounds and intact distal pulses.  Exam reveals no gallop and no friction rub.   No murmur heard. Pulmonary/Chest: Effort normal and breath sounds normal. No respiratory distress. He has no wheezes. He has no rales. He exhibits no tenderness.  Abdominal: Soft. Bowel sounds  are normal. He exhibits no distension and no mass. There is no tenderness.  Musculoskeletal: Normal range of motion. He exhibits no edema or tenderness.  Lymphadenopathy:    He has no cervical adenopathy.  Neurological: He is alert and oriented to person, place, and time. No cranial nerve deficit. Coordination normal.  Skin: No rash noted. No erythema. No pallor.  Psychiatric: He has a normal mood and affect. His behavior is normal. Judgment  and thought content normal.     Labs reviewed: Nursing Home on 07/22/2015  Component Date Value Ref Range Status  . Glucose 07/21/2015 133   Final  . BUN 07/21/2015 18  4 - 21 mg/dL Final  . Creatinine 07/21/2015 0.8  .6 - 1.3 mg/dL Final  . Potassium 07/21/2015 4.1  3.4 - 5.3 mmol/L Final  . Sodium 07/21/2015 141  137 - 147 mmol/L Final  . LDl/HDL Ratio 07/21/2015 2.7   Final  . Triglycerides 07/21/2015 98  40 - 160 mg/dL Final  . Cholesterol 07/21/2015 131  0 - 200 mg/dL Final  . HDL 07/21/2015 49  35 - 70 mg/dL Final  . LDL Cholesterol 07/21/2015 62   Final  . Hgb A1c MFr Bld 07/21/2015 7.2* 4.0 - 6.0 % Final     Assessment/Plan  1. Type 2 diabetes mellitus without complication Continue current medication  2. Hyperlipidemia Continue to monitor  3. Right knee pain Improved, pain-free today  4. Essential hypertension Controlled. Continue current medication.

## 2015-07-29 ENCOUNTER — Encounter: Payer: Self-pay | Admitting: Internal Medicine

## 2015-08-08 NOTE — Progress Notes (Signed)
This encounter was created in error - please disregard.

## 2015-09-18 DIAGNOSIS — Z23 Encounter for immunization: Secondary | ICD-10-CM | POA: Diagnosis not present

## 2015-11-19 ENCOUNTER — Other Ambulatory Visit: Payer: Self-pay | Admitting: Internal Medicine

## 2016-01-01 ENCOUNTER — Encounter: Payer: Self-pay | Admitting: *Deleted

## 2016-01-01 DIAGNOSIS — H02831 Dermatochalasis of right upper eyelid: Secondary | ICD-10-CM | POA: Diagnosis not present

## 2016-01-01 DIAGNOSIS — E119 Type 2 diabetes mellitus without complications: Secondary | ICD-10-CM | POA: Diagnosis not present

## 2016-01-01 DIAGNOSIS — H02834 Dermatochalasis of left upper eyelid: Secondary | ICD-10-CM | POA: Diagnosis not present

## 2016-01-01 DIAGNOSIS — H40012 Open angle with borderline findings, low risk, left eye: Secondary | ICD-10-CM | POA: Diagnosis not present

## 2016-01-01 DIAGNOSIS — H401111 Primary open-angle glaucoma, right eye, mild stage: Secondary | ICD-10-CM | POA: Diagnosis not present

## 2016-01-01 DIAGNOSIS — Z961 Presence of intraocular lens: Secondary | ICD-10-CM | POA: Diagnosis not present

## 2016-01-01 DIAGNOSIS — H26493 Other secondary cataract, bilateral: Secondary | ICD-10-CM | POA: Diagnosis not present

## 2016-01-01 LAB — HM DIABETES EYE EXAM

## 2016-01-22 DIAGNOSIS — E785 Hyperlipidemia, unspecified: Secondary | ICD-10-CM | POA: Diagnosis not present

## 2016-01-22 DIAGNOSIS — I10 Essential (primary) hypertension: Secondary | ICD-10-CM | POA: Diagnosis not present

## 2016-01-22 LAB — LIPID PANEL
Cholesterol: 128 mg/dL (ref 0–200)
HDL: 49 mg/dL (ref 35–70)
LDL Cholesterol: 62 mg/dL
TRIGLYCERIDES: 86 mg/dL (ref 40–160)

## 2016-01-22 LAB — HEPATIC FUNCTION PANEL
ALK PHOS: 63 U/L (ref 25–125)
ALT: 14 U/L (ref 10–40)
AST: 20 U/L (ref 14–40)
BILIRUBIN, TOTAL: 0.7 mg/dL

## 2016-01-22 LAB — BASIC METABOLIC PANEL
BUN: 22 mg/dL — AB (ref 4–21)
Creatinine: 0.8 mg/dL (ref 0.6–1.3)
Glucose: 130 mg/dL
Potassium: 3.8 mmol/L (ref 3.4–5.3)
Sodium: 143 mmol/L (ref 137–147)

## 2016-01-23 ENCOUNTER — Other Ambulatory Visit: Payer: Self-pay

## 2016-01-27 ENCOUNTER — Non-Acute Institutional Stay: Payer: Medicare Other | Admitting: Internal Medicine

## 2016-01-27 VITALS — BP 122/60 | HR 70 | Temp 98.1°F | Resp 20 | Ht 64.0 in | Wt 165.0 lb

## 2016-01-27 DIAGNOSIS — Z794 Long term (current) use of insulin: Secondary | ICD-10-CM

## 2016-01-27 DIAGNOSIS — I10 Essential (primary) hypertension: Secondary | ICD-10-CM

## 2016-01-27 DIAGNOSIS — E785 Hyperlipidemia, unspecified: Secondary | ICD-10-CM

## 2016-01-27 DIAGNOSIS — E119 Type 2 diabetes mellitus without complications: Secondary | ICD-10-CM | POA: Diagnosis not present

## 2016-01-27 MED ORDER — SITAGLIPTIN PHOSPHATE 100 MG PO TABS: ORAL_TABLET | ORAL | Status: DC

## 2016-01-27 NOTE — Progress Notes (Signed)
Patient ID: George [name], male   DOB: 02/17/1924, 82 y.o.   MRN: [id number]    [facility]     Place of Service: Clinic (12)     Allergies  Allergen Reactions  . Metformin And Related   . Sulfa Antibiotics   . Tetanus Toxoids   . Typhoid Vaccines     Chief Complaint  Patient presents with  . Medical Management of Chronic Issues    6 mos f/u     HPI:  Type 2 diabetes mellitus without complication, with long-term current use of insulin ([place]) - Plan: sitaGLIPtin (JANUVIA) 100 MG tablet  Essential hypertension  Hyperlipidemia    Medications: Patient's Medications  New Prescriptions   No medications on file  Previous Medications   ASPIRIN 81 MG TABLET    Take 81 mg by mouth daily.   LUTEIN-ZEAXANTHIN 25-5 MG CAPS    Take by mouth. Take one tablet daily, eye  vitamin   PIOGLITAZONE (ACTOS) 45 MG TABLET    TAKE 1 TABLET BY MOUTH EVERY DAY TO CONTROL BLOOD SUGAR   SELENIUM 200 MCG TABS    Take by mouth. Take one tablet daily   SITAGLIPTIN (JANUVIA) 100 MG TABLET    One each morning to control diabetes  Modified Medications   No medications on file  Discontinued Medications   CELECOXIB (CELEBREX) 200 MG CAPSULE    One daily to help knee pain   TADALAFIL (CIALIS) 20 MG TABLET    One tablet prior to intercourse     Review of Systems  Constitutional: Negative for fever, chills, diaphoresis, activity change, appetite change, fatigue and unexpected weight change.  HENT: Negative for congestion, dental problem, drooling, ear discharge, ear pain, facial swelling, hearing loss, mouth sores, nosebleeds, rhinorrhea, sinus pressure, sneezing and tinnitus.   Eyes:       Prescription lenses, otherwise normal.  Respiratory: Negative for apnea, cough, choking, chest tightness, shortness of breath, wheezing and stridor.   Cardiovascular: Negative for chest pain, palpitations and leg swelling.  Gastrointestinal: Negative for nausea, abdominal pain, diarrhea,  constipation, blood in stool and abdominal distention.  Endocrine:       History diabetes mellitus on oral medications.  Genitourinary: Negative.   Musculoskeletal: Negative for neck pain and neck stiffness.  Skin: Negative.   Neurological: Negative for dizziness, tremors, seizures, speech difficulty, weakness, light-headedness, numbness and headaches.  Hematological: Negative.   Psychiatric/Behavioral: The patient is hyperactive.     Filed Vitals:   01/27/16 0847  BP: 122/60  Pulse: 70  Temp: 98.1 F (36.7 C)  TempSrc: Oral  Resp: 20  Height: '5\' 4"'  (1.626 m)  Weight: 165 lb (74.844 kg)  SpO2: 97%   Wt Readings from Last 3 Encounters:  01/27/16 165 lb (74.844 kg)  07/22/15 162 lb (73.483 kg)  07/01/15 162 lb (73.483 kg)    Body mass index is 28.31 kg/(m^2).  Physical Exam  Constitutional: He is oriented to person, place, and time. He appears well-developed and well-nourished.  HENT:  Head: Normocephalic and atraumatic.  Right Ear: External ear normal.  Left Ear: External ear normal.  Nose: Nose normal.  Eyes:  Corrective lenses. Bilateral lens implants.  Neck: Normal range of motion. Neck supple. No JVD present. No tracheal deviation present. No thyromegaly present.  Cardiovascular: Normal rate, regular rhythm, normal heart sounds and intact distal pulses.  Exam reveals no gallop and no friction rub.   No murmur heard. Pulmonary/Chest: Effort normal and breath sounds normal. No respiratory  distress. He has no wheezes. He has no rales. He exhibits no tenderness.  Abdominal: Soft. Bowel sounds are normal. He exhibits no distension and no mass. There is no tenderness.  Musculoskeletal: Normal range of motion. He exhibits no edema or tenderness.  Lymphadenopathy:    He has no cervical adenopathy.  Neurological: He is alert and oriented to person, place, and time. No cranial nerve deficit. Coordination normal.  Intact vibratory and monofilament testing  Skin: No rash  noted. No erythema. No pallor.  Psychiatric: He has a normal mood and affect. His behavior is normal. Judgment and thought content normal.     Labs reviewed: Lab Summary Latest Ref Rng 01/22/2016 07/21/2015 01/20/2015 09/30/2014  Hemoglobin - (None) (None) (None) (None)  Hematocrit - (None) (None) (None) (None)  White count - (None) (None) (None) (None)  Platelet count - (None) (None) (None) (None)  Sodium 137 - 147 mmol/L 143 141 141 140  Potassium 3.4 - 5.3 mmol/L 3.8 4.1 4.2 4.3  Calcium - (None) (None) (None) (None)  Phosphorus - (None) (None) (None) (None)  Creatinine 0.6 - 1.3 mg/dL 0.8 0.8 0.8 0.7  AST 14 - 40 U/L 20 (None) (None) (None)  Alk Phos 25 - 125 U/L 63 (None) (None) (None)  Bilirubin - (None) (None) (None) (None)  Glucose - 130 133 118 172  Cholesterol 0 - 200 mg/dL 128 131 137 123  HDL cholesterol 35 - 70 mg/dL 49 49 50 47  Triglycerides 40 - 160 mg/dL 86 98 80 87  LDL Direct - (None) (None) (None) (None)  LDL Calc - 62 62 71 59  Total protein - (None) (None) (None) (None)  Albumin - (None) (None) (None) (None)   No results found for: TSH Lab Results  Component Value Date   BUN 22* 01/22/2016   BUN 18 07/21/2015   BUN 23* 01/20/2015   Lab Results  Component Value Date   CREATININE 0.8 01/22/2016   CREATININE 0.8 07/21/2015   CREATININE 0.8 01/20/2015   Lab Results  Component Value Date   HGBA1C 7.2* 07/21/2015   HGBA1C 7.4* 09/30/2014   HGBA1C 7.4* 05/20/2014    Assessment/Plan  1. Type 2 diabetes mellitus without complication, with long-term current use of insulin ([facility]CC) - sitaGLIPtin (JANUVIA) 100 MG tablet; One each morning to control diabetes  Dispense: 90 tablet; Refill: 3 -A1c, CMP  2. Essential hypertension -CMP  3. Hyperlipidemia -lipids

## 2016-07-22 ENCOUNTER — Encounter: Payer: Self-pay | Admitting: Internal Medicine

## 2016-07-22 DIAGNOSIS — E785 Hyperlipidemia, unspecified: Secondary | ICD-10-CM | POA: Diagnosis not present

## 2016-07-22 DIAGNOSIS — I10 Essential (primary) hypertension: Secondary | ICD-10-CM | POA: Diagnosis not present

## 2016-07-22 DIAGNOSIS — E119 Type 2 diabetes mellitus without complications: Secondary | ICD-10-CM | POA: Diagnosis not present

## 2016-07-22 LAB — MICROALBUMIN, URINE: Microalb, Ur: 0.6

## 2016-07-22 LAB — HEPATIC FUNCTION PANEL
ALT: 9 U/L — AB (ref 10–40)
AST: 15 U/L (ref 14–40)
Alkaline Phosphatase: 72 U/L (ref 25–125)
BILIRUBIN, TOTAL: 0.8 mg/dL

## 2016-07-22 LAB — LIPID PANEL
Cholesterol: 127 mg/dL (ref 0–200)
HDL: 55 mg/dL (ref 35–70)
LDL Cholesterol: 53 mg/dL
Triglycerides: 93 mg/dL (ref 40–160)

## 2016-07-22 LAB — BASIC METABOLIC PANEL
BUN: 15 mg/dL (ref 4–21)
CREATININE: 0.8 mg/dL (ref 0.6–1.3)
GLUCOSE: 143 mg/dL
POTASSIUM: 4.2 mmol/L (ref 3.4–5.3)
SODIUM: 142 mmol/L (ref 137–147)

## 2016-07-22 LAB — HEMOGLOBIN A1C: HEMOGLOBIN A1C: 7.5

## 2016-07-23 ENCOUNTER — Other Ambulatory Visit: Payer: Self-pay

## 2016-07-27 ENCOUNTER — Encounter: Payer: Self-pay | Admitting: Internal Medicine

## 2016-07-27 ENCOUNTER — Non-Acute Institutional Stay: Payer: Medicare Other | Admitting: Internal Medicine

## 2016-07-27 VITALS — BP 138/68 | HR 73 | Temp 97.9°F | Ht 64.0 in | Wt 159.0 lb

## 2016-07-27 DIAGNOSIS — E119 Type 2 diabetes mellitus without complications: Secondary | ICD-10-CM

## 2016-07-27 DIAGNOSIS — E785 Hyperlipidemia, unspecified: Secondary | ICD-10-CM

## 2016-07-27 DIAGNOSIS — I10 Essential (primary) hypertension: Secondary | ICD-10-CM | POA: Diagnosis not present

## 2016-07-27 NOTE — Progress Notes (Signed)
Facility      Place of Service: Clinic (12)     Allergies  Allergen Reactions  . Metformin And Related   . Sulfa Antibiotics   . Tetanus Toxoids   . Typhoid Vaccines     Chief Complaint  Patient presents with  . Medical Management of Chronic Issues    6 month medication management blood sugar, blood pressure, cholesterol  . Back Pain    for couple of weeks, right mid-back when he lays down.  . Dizziness    after he eats    HPI:  Nocturnal pain in the right iliac crest area. Not able t feel anything or demonstarte localized tenderness.  Dizzy feelings pass quickly.  Eating more bread with tomato sandwiches. Weight dow 6#.  BP and lipids controlled  Medications: Patient's Medications  New Prescriptions   No medications on file  Previous Medications   ASPIRIN 81 MG TABLET    Take 81 mg by mouth daily.   PIOGLITAZONE (ACTOS) 45 MG TABLET    TAKE 1 TABLET BY MOUTH EVERY DAY TO CONTROL BLOOD SUGAR   SELENIUM 200 MCG TABS    Take by mouth. Take one tablet daily   SITAGLIPTIN (JANUVIA) 100 MG TABLET    One each morning to control diabetes  Modified Medications   No medications on file  Discontinued Medications   LUTEIN-ZEAXANTHIN 25-5 MG CAPS    Take by mouth. Take one tablet daily, eye  vitamin     Review of Systems  Constitutional: Negative for activity change, appetite change, chills, diaphoresis, fatigue, fever and unexpected weight change.  HENT: Negative for congestion, dental problem, drooling, ear discharge, ear pain, facial swelling, hearing loss, mouth sores, nosebleeds, rhinorrhea, sinus pressure, sneezing and tinnitus.   Eyes:       Prescription lenses, otherwise normal.  Respiratory: Negative for apnea, cough, choking, chest tightness, shortness of breath, wheezing and stridor.   Cardiovascular: Negative for chest pain, palpitations and leg swelling.  Gastrointestinal: Negative for abdominal distention, abdominal pain, blood in stool, constipation,  diarrhea and nausea.  Endocrine:       History diabetes mellitus on oral medications.  Genitourinary: Negative.  Negative for enuresis, frequency, hematuria and urgency.  Musculoskeletal: Negative for neck pain and neck stiffness.       Pain in the right iliac crest area at night.  Skin: Negative.   Neurological: Negative for dizziness, tremors, seizures, speech difficulty, weakness, light-headedness, numbness and headaches.  Hematological: Negative.   Psychiatric/Behavioral: The patient is hyperactive.     Vitals:   07/27/16 0840  BP: 138/68  Pulse: 73  Temp: 97.9 F (36.6 C)  TempSrc: Oral  SpO2: 98%  Weight: 159 lb (72.1 kg)  Height: '5\' 4"'$  (1.626 m)   Wt Readings from Last 3 Encounters:  07/27/16 159 lb (72.1 kg)  01/27/16 165 lb (74.8 kg)  07/22/15 162 lb (73.5 kg)    Body mass index is 27.29 kg/m.  Physical Exam  Constitutional: He is oriented to person, place, and time. He appears well-developed and well-nourished.  HENT:  Head: Normocephalic and atraumatic.  Right Ear: External ear normal.  Left Ear: External ear normal.  Nose: Nose normal.  Eyes:  Corrective lenses. Bilateral lens implants.  Neck: Normal range of motion. Neck supple. No JVD present. No tracheal deviation present. No thyromegaly present.  Cardiovascular: Normal rate, regular rhythm, normal heart sounds and intact distal pulses.  Exam reveals no gallop and no friction rub.   No murmur  heard. Pulmonary/Chest: Effort normal and breath sounds normal. No respiratory distress. He has no wheezes. He has no rales. He exhibits no tenderness.  Abdominal: Soft. Bowel sounds are normal. He exhibits no distension and no mass. There is no tenderness.  Musculoskeletal: Normal range of motion. He exhibits no edema or tenderness.  Unable to demonstrate any focal tenderness in the right iliac crest or lower back  Lymphadenopathy:    He has no cervical adenopathy.  Neurological: He is alert and oriented to  person, place, and time. No cranial nerve deficit. Coordination normal.  Intact vibratory and monofilament testing  Skin: No rash noted. No erythema. No pallor.  Psychiatric: He has a normal mood and affect. His behavior is normal. Judgment and thought content normal.     Labs reviewed: Lab Summary Latest Ref Rng & Units 07/22/2016 01/22/2016 07/21/2015 01/20/2015  Hemoglobin - (None) (None) (None) (None)  Hematocrit - (None) (None) (None) (None)  White count - (None) (None) (None) (None)  Platelet count - (None) (None) (None) (None)  Sodium 137 - 147 mmol/L 142 143 141 141  Potassium 3.4 - 5.3 mmol/L 4.2 3.8 4.1 4.2  Calcium - (None) (None) (None) (None)  Phosphorus - (None) (None) (None) (None)  Creatinine 0.6 - 1.3 mg/dL 0.8 0.8 0.8 0.8  AST 14 - 40 U/L 15 20 (None) (None)  Alk Phos 25 - 125 U/L 72 63 (None) (None)  Bilirubin - (None) (None) (None) (None)  Glucose mg/dL 143 130 133 118  Cholesterol 0 - 200 mg/dL 127 128 131 137  HDL cholesterol 35 - 70 mg/dL 55 49 49 50  Triglycerides 40 - 160 mg/dL 93 86 98 80  LDL Direct - (None) (None) (None) (None)  LDL Calc mg/dL 53 62 62 71  Total protein - (None) (None) (None) (None)  Albumin - (None) (None) (None) (None)  Some recent data might be hidden   No results found for: TSH Lab Results  Component Value Date   BUN 15 07/22/2016   BUN 22 (A) 01/22/2016   BUN 18 07/21/2015   Lab Results  Component Value Date   CREATININE 0.8 07/22/2016   CREATININE 0.8 01/22/2016   CREATININE 0.8 07/21/2015   Lab Results  Component Value Date   HGBA1C 7.5 07/22/2016   HGBA1C 7.2 (A) 07/21/2015   HGBA1C 7.4 (A) 09/30/2014       Assessment/Plan  1. Type 2 diabetes mellitus without complication, without long-term current use of insulin ([facility]CC) Emphasized dietary compliance. Continue current meds. A1c, future  2. Essential hypertension Controlled CMP, future  3. Hyperlipidemia controlled

## 2016-08-29 ENCOUNTER — Encounter (HOSPITAL_COMMUNITY): Payer: Self-pay | Admitting: Emergency Medicine

## 2016-08-29 ENCOUNTER — Emergency Department (HOSPITAL_COMMUNITY)
Admission: EM | Admit: 2016-08-29 | Discharge: 2016-08-29 | Disposition: A | Discharge status: Home / Self Care | Payer: Medicare Other | Attending: Emergency Medicine | Admitting: Emergency Medicine

## 2016-08-29 DIAGNOSIS — E119 Type 2 diabetes mellitus without complications: Secondary | ICD-10-CM | POA: Insufficient documentation

## 2016-08-29 DIAGNOSIS — I10 Essential (primary) hypertension: Secondary | ICD-10-CM | POA: Insufficient documentation

## 2016-08-29 DIAGNOSIS — R310 Gross hematuria: Secondary | ICD-10-CM | POA: Diagnosis not present

## 2016-08-29 DIAGNOSIS — Z7982 Long term (current) use of aspirin: Secondary | ICD-10-CM | POA: Insufficient documentation

## 2016-08-29 DIAGNOSIS — R319 Hematuria, unspecified: Secondary | ICD-10-CM

## 2016-08-29 DIAGNOSIS — Z87891 Personal history of nicotine dependence: Secondary | ICD-10-CM | POA: Diagnosis not present

## 2016-08-29 DIAGNOSIS — Z7984 Long term (current) use of oral hypoglycemic drugs: Secondary | ICD-10-CM | POA: Insufficient documentation

## 2016-08-29 LAB — URINE MICROSCOPIC-ADD ON
SQUAMOUS EPITHELIAL / LPF: NONE SEEN
WBC, UA: NONE SEEN WBC/hpf (ref 0–5)

## 2016-08-29 LAB — CBC WITH DIFFERENTIAL/PLATELET
BASOS ABS: 0 10*3/uL (ref 0.0–0.1)
BASOS PCT: 1 %
Eosinophils Absolute: 0.1 10*3/uL (ref 0.0–0.7)
Eosinophils Relative: 1 %
HEMATOCRIT: 39.5 % (ref 39.0–52.0)
HEMOGLOBIN: 13.3 g/dL (ref 13.0–17.0)
Lymphocytes Relative: 22 %
Lymphs Abs: 1.3 10*3/uL (ref 0.7–4.0)
MCH: 33.4 pg (ref 26.0–34.0)
MCHC: 33.7 g/dL (ref 30.0–36.0)
MCV: 99.2 fL (ref 78.0–100.0)
MONOS PCT: 12 %
Monocytes Absolute: 0.7 10*3/uL (ref 0.1–1.0)
NEUTROS ABS: 3.8 10*3/uL (ref 1.7–7.7)
NEUTROS PCT: 64 %
PLATELETS: 281 10*3/uL (ref 150–400)
RBC: 3.98 MIL/uL — ABNORMAL LOW (ref 4.22–5.81)
RDW: 17 % — AB (ref 11.5–15.5)
WBC: 5.8 10*3/uL (ref 4.0–10.5)

## 2016-08-29 LAB — BASIC METABOLIC PANEL
ANION GAP: 6 (ref 5–15)
BUN: 22 mg/dL — ABNORMAL HIGH (ref 6–20)
CALCIUM: 8.7 mg/dL — AB (ref 8.9–10.3)
CHLORIDE: 104 mmol/L (ref 101–111)
CO2: 28 mmol/L (ref 22–32)
Creatinine, Ser: 0.72 mg/dL (ref 0.61–1.24)
GFR calc non Af Amer: >60 mL/min (ref >60)
Glucose, Bld: 208 mg/dL — ABNORMAL HIGH (ref 65–99)
Potassium: 3.6 mmol/L (ref 3.5–5.1)
Sodium: 138 mmol/L (ref 135–145)

## 2016-08-29 LAB — URINALYSIS, ROUTINE W REFLEX MICROSCOPIC
BILIRUBIN URINE: NEGATIVE
GLUCOSE, UA: NEGATIVE mg/dL
Ketones, ur: NEGATIVE mg/dL
Leukocytes, UA: NEGATIVE
Nitrite: NEGATIVE
PH: 6 (ref 5.0–8.0)
Protein, ur: NEGATIVE mg/dL
SPECIFIC GRAVITY, URINE: 1.013 (ref 1.005–1.030)

## 2016-08-29 LAB — POC OCCULT BLOOD, ED: FECAL OCCULT BLD: NEGATIVE

## 2016-08-29 NOTE — ED Notes (Signed)
Pt stated this morning he noticed a small amount of blood in his urine. No pain when he urinates but has been having some pain in his back on the right side

## 2016-08-29 NOTE — ED Triage Notes (Signed)
Patient states that when he urinated this morning he had bright red blood in urine. Patient denies any pain or trouble with voiding.

## 2016-08-29 NOTE — ED Notes (Signed)
RN starting IV 

## 2016-08-29 NOTE — ED Provider Notes (Signed)
[facility] DEPT Provider Note   CSN: [id number] Arrival date & time: 08/29/16  0740     History   Chief Complaint Chief Complaint  Patient presents with  . Hematuria    HPI George [name] is a 82 y.o. male presents with single episode of blood in his urine after having a BM today.  He denies a history of gross hematuria, although it is listed in his chart. He does not think it came from bottom. He may have had a fleeting sensation of minimal r Flank pain. But he denies abdominal pain, nausea, vomiting, fevers, chills malaise. Patient denies a smoking hx and chewed tobacco in HS.  HPI  Past Medical History:  Diagnosis Date  . Abdominal pain, other specified site   . Allergic rhinitis due to pollen   . Benign paroxysmal positional vertigo   . Disturbance of skin sensation    left great toe  . Elevated prostate specific antigen (PSA)   . Hematuria, unspecified   . Hypertrophy of prostate without urinary obstruction and other lower urinary tract symptoms (LUTS)   . Impotence of organic origin   . Left knee pain 05/28/2014  . Other and unspecified hyperlipidemia   . Other malaise and fatigue   . Spermatocele    bilateral  . Type II or unspecified type diabetes mellitus without mention of complication, uncontrolled   . Unspecified essential hypertension   . Unspecified glaucoma     Patient Active Problem List   Diagnosis Date Noted  . Right knee pain 07/01/2015  . Left knee pain 05/28/2014  . Deaf 01/22/2014  . DM type 2 (diabetes mellitus, type 2) ([place])   . Hyperlipidemia   . Benign paroxysmal positional vertigo   . Essential hypertension   . BPH (benign prostatic hyperplasia)   . Impotence of organic origin     Past Surgical History:  Procedure Laterality Date  . CATARACT EXTRACTION EXTRACAPSULAR  2008   bilateraly Dr. Katy Fitch  . COLONOSCOPY  01/03/2002   normal Dr. Earlean Shawl  . PROSTATE BIOPSY  1995   due to elevated PSA normal  . TONSILLECTOMY          Home Medications    Prior to Admission medications   Medication Sig Start Date End Date Taking? Authorizing Provider  aspirin 81 MG tablet Take 81 mg by mouth daily.    Historical Provider, MD  pioglitazone (ACTOS) 45 MG tablet TAKE 1 TABLET BY MOUTH EVERY DAY TO CONTROL BLOOD SUGAR 11/19/15   Estill Dooms, MD  Selenium 200 MCG TABS Take by mouth. Take one tablet daily    Historical Provider, MD  sitaGLIPtin (JANUVIA) 100 MG tablet One each morning to control diabetes 01/27/16   Estill Dooms, MD    Family History Family History  Problem Relation Age of Onset  . Cancer Father     lung  . Cancer Brother     adrenal gland  . Cancer Son     liver    Social History Social History  Substance Use Topics  . Smoking status: Former Smoker    Quit date: 04/09/1948  . Smokeless tobacco: Never Used  . Alcohol use No     Allergies   Metformin and related; Sulfa antibiotics; Tetanus toxoids; and Typhoid vaccines   Review of Systems Review of Systems Ten systems reviewed and are negative for acute change, except as noted in the HPI.    Physical Exam Updated Vital Signs BP 131/60 (BP Location: Left Arm)  Pulse 74   Temp 98 F (36.7 C) (Oral)   Resp 15   Ht 5\' 5"  (1.651 m)   Wt 72.6 kg   SpO2 97%   BMI 26.63 kg/m   Physical Exam  Physical Exam  Nursing note and vitals reviewed. Constitutional: He appears well-developed and well-nourished. No distress.  HENT:  Head: Normocephalic and atraumatic.  Eyes: Conjunctivae normal are normal. No scleral icterus.  Neck: Normal range of motion. Neck supple.  Cardiovascular: Normal rate, regular rhythm and normal heart sounds.   Pulmonary/Chest: Effort normal and breath sounds normal. No respiratory distress.  Abdominal: Soft. There is no tenderness.  Musculoskeletal: He exhibits no edema.  Neurological: He is alert.  Skin: Skin is warm and dry. He is not diaphoretic.  GU: normal male anatomy, circumcised. No blood  at urethral meatus.  Digital Rectal Exam Psychiatric: His behavior is normal.    ED Treatments / Results  Labs (all labs ordered are listed, but only abnormal results are displayed) Labs Reviewed  URINALYSIS, ROUTINE W REFLEX MICROSCOPIC (NOT AT [facility])  BASIC METABOLIC PANEL  CBC WITH DIFFERENTIAL/PLATELET    EKG  EKG Interpretation None       Radiology No results found.  Procedures Procedures (including critical care time)  Medications Ordered in ED Medications - No data to display   Initial Impression / Assessment and Plan / ED Course  I have reviewed the triage vital signs and the nursing notes.  Pertinent labs & imaging results that were available during my care of the patient were reviewed by me and considered in my medical decision making (see chart for details).  Clinical Course   Patient with microscopic hematuria on his urinalysis. No evidence of bleeding from his rectum, negative fecal occult test. He has no urinary symptoms other than gross hematuria this morning. I discussed hematuria, but the patient and his need for follow-up with urology for further testing. Patient expresses his understanding and the need for follow-up. I discussed return precautions with the patient. Patient seen in shared visit with Dr. Gilford Raid.  Patient appears safe for discharge at this time.  Final Clinical Impressions(s) / ED Diagnoses   Final diagnoses:  None    New Prescriptions New Prescriptions   No medications on file     Margarita Mail, PA-C 08/29/16 0955    Isla Pence, MD 08/29/16 1228

## 2016-08-31 DIAGNOSIS — N402 Nodular prostate without lower urinary tract symptoms: Secondary | ICD-10-CM | POA: Diagnosis not present

## 2016-08-31 DIAGNOSIS — R310 Gross hematuria: Secondary | ICD-10-CM | POA: Diagnosis not present

## 2016-09-03 DIAGNOSIS — L570 Actinic keratosis: Secondary | ICD-10-CM | POA: Diagnosis not present

## 2016-09-03 DIAGNOSIS — D485 Neoplasm of uncertain behavior of skin: Secondary | ICD-10-CM | POA: Diagnosis not present

## 2016-09-03 DIAGNOSIS — D2239 Melanocytic nevi of other parts of face: Secondary | ICD-10-CM | POA: Diagnosis not present

## 2016-09-03 DIAGNOSIS — Z85828 Personal history of other malignant neoplasm of skin: Secondary | ICD-10-CM | POA: Diagnosis not present

## 2016-09-06 DIAGNOSIS — R310 Gross hematuria: Secondary | ICD-10-CM | POA: Diagnosis not present

## 2016-09-08 DIAGNOSIS — N401 Enlarged prostate with lower urinary tract symptoms: Secondary | ICD-10-CM | POA: Diagnosis not present

## 2016-09-08 DIAGNOSIS — R310 Gross hematuria: Secondary | ICD-10-CM | POA: Diagnosis not present

## 2016-09-08 DIAGNOSIS — R972 Elevated prostate specific antigen [PSA]: Secondary | ICD-10-CM | POA: Diagnosis not present

## 2016-09-08 DIAGNOSIS — R3912 Poor urinary stream: Secondary | ICD-10-CM | POA: Diagnosis not present

## 2016-09-21 ENCOUNTER — Non-Acute Institutional Stay: Payer: Medicare Other | Admitting: Internal Medicine

## 2016-09-21 ENCOUNTER — Encounter: Payer: Self-pay | Admitting: Internal Medicine

## 2016-09-21 ENCOUNTER — Other Ambulatory Visit: Payer: Self-pay

## 2016-09-21 DIAGNOSIS — R42 Dizziness and giddiness: Secondary | ICD-10-CM | POA: Diagnosis not present

## 2016-09-21 MED ORDER — GLUCOSE BLOOD VI STRP: ORAL_STRIP | 2 refills | Status: DC

## 2016-09-21 MED ORDER — MICROLET LANCETS MISC: 2 refills | Status: DC

## 2016-09-21 NOTE — Progress Notes (Signed)
Facility  [facility]    Place of Service: Clinic (12)     Allergies  Allergen Reactions  . Metformin And Related   . Sulfa Antibiotics   . Tetanus Toxoids   . Typhoid Vaccines     Chief Complaint  Patient presents with  . Acute Visit    09/18/16 became dizzy after carrying items from car to apartment, before lunch. Blood sugar was 177, per nurse.  Been feeling light headed lately.  Doesn't check blood sugar    HPI:  Patient describes a light headed or giddy feeling that is not a true vertigo with spinniing sensations. Denies associated symptoms such as in crease in symptoms with changes in position, headache, visual change, muscular weakness, falls, etc. No palpitations or chest discomfort. Denies SOB.  Symptoms are present throughout the day.   Problems of hematuria have completely resolved No dysuria.  Medications: Patient's Medications  New Prescriptions   No medications on file  Previous Medications   ASPIRIN 81 MG TABLET    Take 81 mg by mouth daily.   PIOGLITAZONE (ACTOS) 45 MG TABLET    TAKE 1 TABLET BY MOUTH EVERY DAY TO CONTROL BLOOD SUGAR   SELENIUM 200 MCG TABS    Take by mouth. Take one tablet daily   SITAGLIPTIN (JANUVIA) 100 MG TABLET    One each morning to control diabetes  Modified Medications   No medications on file  Discontinued Medications   No medications on file    Review of Systems  Constitutional: Negative for activity change, appetite change, chills, diaphoresis, fatigue, fever and unexpected weight change.  HENT: Negative for congestion, dental problem, drooling, ear discharge, ear pain, facial swelling, hearing loss, mouth sores, nosebleeds, rhinorrhea, sinus pressure, sneezing and tinnitus.   Eyes:       Prescription lenses, otherwise normal.  Respiratory: Negative for apnea, cough, choking, chest tightness, shortness of breath, wheezing and stridor.   Cardiovascular: Negative for chest pain, palpitations and leg swelling.  Gastrointestinal:  Negative for abdominal distention, abdominal pain, blood in stool, constipation, diarrhea and nausea.  Endocrine:       History diabetes mellitus on oral medications.  Genitourinary: Negative.  Negative for enuresis, frequency, hematuria and urgency.  Musculoskeletal: Negative for neck pain and neck stiffness.       Pain in the right iliac crest area at night.  Skin: Negative.   Neurological: Negative for dizziness, tremors, seizures, speech difficulty, weakness, light-headedness, numbness and headaches.       Light headed or giddy feelings  Hematological: Negative.   Psychiatric/Behavioral: The patient is hyperactive.     Vitals:   09/21/16 0913  BP: (!) 120/58  Pulse: 68  Temp: 97.7 F (36.5 C)  TempSrc: Oral  SpO2: 97%  Weight: 159 lb (72.1 kg)  Height: _0  (1.651 m)   Body mass index is 26.46 kg/m. Wt Readings from Last 3 Encounters:  09/21/16 159 lb (72.1 kg)  08/29/16 160 lb (72.6 kg)  07/27/16 159 lb (72.1 kg)      Physical Exam  Constitutional: He is oriented to person, place, and time. He appears well-developed and well-nourished.  HENT:  Head: Normocephalic and atraumatic.  Right Ear: External ear normal.  Left Ear: External ear normal.  Nose: Nose normal.  Eyes:  Corrective lenses. Bilateral lens implants.  Neck: Normal range of motion. Neck supple. No JVD present. No tracheal deviation present. No thyromegaly present.  Cardiovascular: Normal rate, regular rhythm, normal heart sounds and intact distal  pulses.  Exam reveals no gallop and no friction rub.   No murmur heard. Pulmonary/Chest: Effort normal and breath sounds normal. No respiratory distress. He has no wheezes. He has no rales. He exhibits no tenderness.  Abdominal: Soft. Bowel sounds are normal. He exhibits no distension and no mass. There is no tenderness.  Musculoskeletal: Normal range of motion. He exhibits no edema or tenderness.  Lymphadenopathy:    He has no cervical adenopathy.    Neurological: He is alert and oriented to person, place, and time. No cranial nerve deficit. Coordination normal.  Intact vibratory and monofilament testing  Skin: No rash noted. No erythema. No pallor.  Psychiatric: He has a normal mood and affect. His behavior is normal. Judgment and thought content normal.    Labs reviewed: Lab Summary Latest Ref Rng & Units 08/29/2016 07/22/2016 01/22/2016 07/21/2015  Hemoglobin 13.0 - 17.0 g/dL 13.3 (None) (None) (None)  Hematocrit 39.0 - 52.0 % 39.5 (None) (None) (None)  White count 4.0 - 10.5 K/uL 5.8 (None) (None) (None)  Platelet count 150 - 400 K/uL 281 (None) (None) (None)  Sodium 135 - 145 mmol/L 138 142 143 141  Potassium 3.5 - 5.1 mmol/L 3.6 4.2 3.8 4.1  Calcium 8.9 - 10.3 mg/dL 8.7(L) (None) (None) (None)  Phosphorus - (None) (None) (None) (None)  Creatinine 0.61 - 1.24 mg/dL 0.72 0.8 0.8 0.8  AST 14 - 40 U/L (None) 15 20 (None)  Alk Phos 25 - 125 U/L (None) 72 63 (None)  Bilirubin - (None) (None) (None) (None)  Glucose 65 - 99 mg/dL 208(H) 143 130 133  Cholesterol 0 - 200 mg/dL (None) 127 128 131  HDL cholesterol 35 - 70 mg/dL (None) 55 49 49  Triglycerides 40 - 160 mg/dL (None) 93 86 98  LDL Direct - (None) (None) (None) (None)  LDL Calc mg/dL (None) 53 62 62  Total protein - (None) (None) (None) (None)  Albumin - (None) (None) (None) (None)  Some recent data might be hidden   No results found for: TSH, T3TOTAL, T4TOTAL, THYROIDAB Lab Results  Component Value Date   BUN 22 (H) 08/29/2016   BUN 15 07/22/2016   BUN 22 (A) 01/22/2016   Lab Results  Component Value Date   HGBA1C 7.5 07/22/2016   HGBA1C 7.2 (A) 07/21/2015   HGBA1C 7.4 (A) 09/30/2014    Assessment/Plan  1. Light-headed feeling -CBC, CMP -check AM glucose daily -return in 2 weeks.

## 2016-09-21 NOTE — Telephone Encounter (Signed)
Patient came back to clinic need Rx for lancets and BS strips, exp 01/2013. To [facility], done.

## 2016-09-21 NOTE — Telephone Encounter (Signed)
Had to re-fax Rx's had ICD 9 code, had to re-fax with correct ICD 10  Code E11.9

## 2016-09-23 ENCOUNTER — Telehealth: Payer: Self-pay

## 2016-09-23 ENCOUNTER — Encounter: Payer: Self-pay | Admitting: Internal Medicine

## 2016-09-23 DIAGNOSIS — R42 Dizziness and giddiness: Secondary | ICD-10-CM | POA: Diagnosis not present

## 2016-09-23 DIAGNOSIS — E119 Type 2 diabetes mellitus without complications: Secondary | ICD-10-CM | POA: Diagnosis not present

## 2016-09-23 DIAGNOSIS — H8110 Benign paroxysmal vertigo, unspecified ear: Secondary | ICD-10-CM | POA: Diagnosis not present

## 2016-09-23 LAB — CBC AND DIFFERENTIAL
HCT: 41 % (ref 41–53)
HEMOGLOBIN: 13.4 g/dL — AB (ref 13.5–17.5)
Platelets: 289 10*3/uL (ref 150–399)
WBC: 5.4 10*3/mL

## 2016-09-23 LAB — BASIC METABOLIC PANEL
BUN: 21 mg/dL (ref 4–21)
CREATININE: 0.8 mg/dL (ref 0.6–1.3)
Glucose: 127 mg/dL
POTASSIUM: 4.1 mmol/L (ref 3.4–5.3)
SODIUM: 141 mmol/L (ref 137–147)

## 2016-09-23 LAB — HEPATIC FUNCTION PANEL
ALT: 7 U/L — AB (ref 10–40)
AST: 14 U/L (ref 14–40)
Alkaline Phosphatase: 66 U/L (ref 25–125)
Bilirubin, Total: 0.9 mg/dL

## 2016-09-23 NOTE — Telephone Encounter (Signed)
Called patient with his 09/23/16 labs, per Dr. Nyoka Cowden they look good, the glucose was down room 08/29/16 ER visit of 210 to 127. Patient was pleased with this. Will keep his appt with Dr. Nyoka Cowden 10/05/16 at 8:30.

## 2016-09-30 DIAGNOSIS — Z23 Encounter for immunization: Secondary | ICD-10-CM | POA: Diagnosis not present

## 2016-10-05 ENCOUNTER — Encounter: Payer: Self-pay | Admitting: Internal Medicine

## 2016-10-12 ENCOUNTER — Non-Acute Institutional Stay: Payer: Medicare Other | Admitting: Internal Medicine

## 2016-10-12 ENCOUNTER — Encounter: Payer: Self-pay | Admitting: Internal Medicine

## 2016-10-12 VITALS — BP 120/62 | HR 80 | Temp 97.8°F | Ht 65.0 in | Wt 156.0 lb

## 2016-10-12 DIAGNOSIS — E785 Hyperlipidemia, unspecified: Secondary | ICD-10-CM

## 2016-10-12 DIAGNOSIS — R202 Paresthesia of skin: Secondary | ICD-10-CM | POA: Diagnosis not present

## 2016-10-12 DIAGNOSIS — I10 Essential (primary) hypertension: Secondary | ICD-10-CM | POA: Diagnosis not present

## 2016-10-12 DIAGNOSIS — E119 Type 2 diabetes mellitus without complications: Secondary | ICD-10-CM | POA: Diagnosis not present

## 2016-10-12 DIAGNOSIS — D7589 Other specified diseases of blood and blood-forming organs: Secondary | ICD-10-CM | POA: Insufficient documentation

## 2016-10-12 NOTE — Progress Notes (Signed)
Facility  [facility]    Place of Service: Clinic (12)     Allergies  Allergen Reactions  . Metformin And Related   . Sulfa Antibiotics   . Tetanus Toxoids   . Typhoid Vaccines     Chief Complaint  Patient presents with  . Medical Management of Chronic Issues    2 week medication management diabetes, light-headed, review labs.  Copy of home BS.    HPI:   Type 2 diabetes mellitus without complication, without long-term current use of insulin ([place]) - continues with mild elevations in fasting glucose. A1c was 7.5 on 07/22/16.  Essential hypertension - controlled  Hyperlipidemia, unspecified hyperlipidemia type  Previous lightheaded feelings have completely resolved.  Patient has noted paresthesias of the right arm. Symptoms included tingling sensation of the entire right arm from the shoulder to the fingertips. It seems to be there most all the time. He has not noticed any particular weakness. He is not dropping glasses or silverware.  Recent lab work disclosed a mild macrocytosis MCV 100.5  Medications: Patient's Medications  New Prescriptions   No medications on file  Previous Medications   ASPIRIN 81 MG TABLET    Take 81 mg by mouth daily.   GLUCOSE BLOOD TEST STRIP    Use one strip to test blood sugar once a day before breakfast . Dx E11.9   MICROLET LANCETS MISC    Use one lancet each morning to test blood sugar. Dx. E11.9   PIOGLITAZONE (ACTOS) 45 MG TABLET    TAKE 1 TABLET BY MOUTH EVERY DAY TO CONTROL BLOOD SUGAR   SELENIUM 200 MCG TABS    Take by mouth. Take one tablet daily   SITAGLIPTIN (JANUVIA) 100 MG TABLET    One each morning to control diabetes  Modified Medications   No medications on file  Discontinued Medications   No medications on file     Review of Systems  Constitutional: Negative for activity change, appetite change, chills, diaphoresis, fatigue, fever and unexpected weight change.  HENT: Negative for congestion, dental problem, drooling, ear  discharge, ear pain, facial swelling, hearing loss, mouth sores, nosebleeds, rhinorrhea, sinus pressure, sneezing and tinnitus.   Eyes:       Prescription lenses, otherwise normal.  Respiratory: Negative for apnea, cough, choking, chest tightness, shortness of breath, wheezing and stridor.   Cardiovascular: Negative for chest pain, palpitations and leg swelling.  Gastrointestinal: Negative for abdominal distention, abdominal pain, blood in stool, constipation, diarrhea and nausea.  Endocrine:       History diabetes mellitus on oral medications.  Genitourinary: Negative.  Negative for enuresis, frequency, hematuria and urgency.  Musculoskeletal: Negative for neck pain and neck stiffness.       Pain in the right iliac crest area at night.  Skin: Negative.   Neurological: Negative for dizziness, tremors, seizures, speech difficulty, weakness, light-headedness, numbness and headaches.       Light headed or giddy feelings have resolved. Tingling paresthesias of the right arm from shoulder to fingers.  Hematological: Negative.   Psychiatric/Behavioral: The patient is hyperactive.     Vitals:   10/12/16 0856  BP: 120/62  Pulse: 80  Temp: 97.8 F (36.6 C)  TempSrc: Oral  SpO2: 96%  Weight: 156 lb (70.8 kg)  Height: '5\' 5"'  (1.651 m)   Wt Readings from Last 3 Encounters:  10/12/16 156 lb (70.8 kg)  09/21/16 159 lb (72.1 kg)  08/29/16 160 lb (72.6 kg)    Body mass index is  25.96 kg/m.  Physical Exam  Constitutional: He is oriented to person, place, and time. He appears well-developed and well-nourished.  HENT:  Head: Normocephalic and atraumatic.  Right Ear: External ear normal.  Left Ear: External ear normal.  Nose: Nose normal.  Eyes:  Corrective lenses. Bilateral lens implants.  Neck: Normal range of motion. Neck supple. No JVD present. No tracheal deviation present. No thyromegaly present.  Cardiovascular: Normal rate, regular rhythm, normal heart sounds and intact distal  pulses.  Exam reveals no gallop and no friction rub.   No murmur heard. Pulmonary/Chest: Effort normal and breath sounds normal. No respiratory distress. He has no wheezes. He has no rales. He exhibits no tenderness.  Abdominal: Soft. Bowel sounds are normal. He exhibits no distension and no mass. There is no tenderness.  Musculoskeletal: Normal range of motion. He exhibits no edema or tenderness.  Lymphadenopathy:    He has no cervical adenopathy.  Neurological: He is alert and oriented to person, place, and time. No cranial nerve deficit. Coordination normal.  Intact vibratory and monofilament testing. Right arm falls slightly when extended and eyes are closed.  Skin: No rash noted. No erythema. No pallor.  Psychiatric: He has a normal mood and affect. His behavior is normal. Judgment and thought content normal.     Labs reviewed: Lab Summary Latest Ref Rng & Units 09/23/2016 08/29/2016 07/22/2016 01/22/2016  Hemoglobin 13.5 - 17.5 g/dL 13.4(A) 13.3 (None) (None)  Hematocrit 41 - 53 % 41 39.5 (None) (None)  White count 10:3/mL 5.4 5.8 (None) (None)  Platelet count 150 - 399 K/L 289 281 (None) (None)  Sodium 137 - 147 mmol/L 141 138 142 143  Potassium 3.4 - 5.3 mmol/L 4.1 3.6 4.2 3.8  Calcium 8.9 - 10.3 mg/dL (None) 8.7(L) (None) (None)  Phosphorus - (None) (None) (None) (None)  Creatinine 0.6 - 1.3 mg/dL 0.8 0.72 0.8 0.8  AST 14 - 40 U/L 14 (None) 15 20  Alk Phos 25 - 125 U/L 66 (None) 72 63  Bilirubin - (None) (None) (None) (None)  Glucose mg/dL 127 208(H) 143 130  Cholesterol 0 - 200 mg/dL (None) (None) 127 128  HDL cholesterol 35 - 70 mg/dL (None) (None) 55 49  Triglycerides 40 - 160 mg/dL (None) (None) 93 86  LDL Direct - (None) (None) (None) (None)  LDL Calc mg/dL (None) (None) 53 62  Total protein - (None) (None) (None) (None)  Albumin - (None) (None) (None) (None)  Some recent data might be hidden   No results found for: TSH Lab Results  Component Value Date   BUN 21  09/23/2016   BUN 22 (H) 08/29/2016   BUN 15 07/22/2016   Lab Results  Component Value Date   CREATININE 0.8 09/23/2016   CREATININE 0.72 08/29/2016   CREATININE 0.8 07/22/2016   Lab Results  Component Value Date   HGBA1C 7.5 07/22/2016   HGBA1C 7.2 (A) 07/21/2015   HGBA1C 7.4 (A) 09/30/2014       Assessment/Plan  1. Type 2 diabetes mellitus without complication, without long-term current use of insulin ([facility]) -fair control for man aged 82. - A1c, CMP, microalbumin  2. Essential hypertension controlled  3. Hyperlipidemia, unspecified hyperlipidemia type -stable  4. Paresthesia of the right arm -Discussed possible etiologies. He does not feel a need to do MRI of the neck or PNCV yet. - B12, CMP  5. Macrocytosis -B12

## 2016-10-13 ENCOUNTER — Other Ambulatory Visit: Payer: Self-pay

## 2016-10-13 DIAGNOSIS — R202 Paresthesia of skin: Secondary | ICD-10-CM

## 2016-10-13 DIAGNOSIS — E1100 Type 2 diabetes mellitus with hyperosmolarity without nonketotic hyperglycemic-hyperosmolar coma (NKHHC): Secondary | ICD-10-CM

## 2016-11-05 DIAGNOSIS — M19211 Secondary osteoarthritis, right shoulder: Secondary | ICD-10-CM | POA: Diagnosis not present

## 2016-12-03 DIAGNOSIS — L720 Epidermal cyst: Secondary | ICD-10-CM | POA: Diagnosis not present

## 2016-12-03 DIAGNOSIS — D1801 Hemangioma of skin and subcutaneous tissue: Secondary | ICD-10-CM | POA: Diagnosis not present

## 2016-12-03 DIAGNOSIS — L821 Other seborrheic keratosis: Secondary | ICD-10-CM | POA: Diagnosis not present

## 2016-12-03 DIAGNOSIS — Z85828 Personal history of other malignant neoplasm of skin: Secondary | ICD-10-CM | POA: Diagnosis not present

## 2016-12-03 DIAGNOSIS — D2262 Melanocytic nevi of left upper limb, including shoulder: Secondary | ICD-10-CM | POA: Diagnosis not present

## 2017-01-28 ENCOUNTER — Other Ambulatory Visit: Payer: Self-pay

## 2017-01-28 DIAGNOSIS — E1100 Type 2 diabetes mellitus with hyperosmolarity without nonketotic hyperglycemic-hyperosmolar coma (NKHHC): Secondary | ICD-10-CM

## 2017-01-28 DIAGNOSIS — R202 Paresthesia of skin: Secondary | ICD-10-CM

## 2017-02-01 ENCOUNTER — Other Ambulatory Visit: Payer: Medicare Other

## 2017-02-03 ENCOUNTER — Other Ambulatory Visit: Payer: Medicare Other

## 2017-02-03 DIAGNOSIS — R202 Paresthesia of skin: Secondary | ICD-10-CM | POA: Diagnosis not present

## 2017-02-03 DIAGNOSIS — E1100 Type 2 diabetes mellitus with hyperosmolarity without nonketotic hyperglycemic-hyperosmolar coma (NKHHC): Secondary | ICD-10-CM | POA: Diagnosis not present

## 2017-02-03 LAB — COMPLETE METABOLIC PANEL WITH GFR
ALBUMIN: 3.9 g/dL (ref 3.6–5.1)
ALK PHOS: 69 U/L (ref 40–115)
ALT: 7 U/L — ABNORMAL LOW (ref 9–46)
AST: 15 U/L (ref 10–35)
BILIRUBIN TOTAL: 0.9 mg/dL (ref 0.2–1.2)
BUN: 19 mg/dL (ref 7–25)
CALCIUM: 8.9 mg/dL (ref 8.6–10.3)
CO2: 26 mmol/L (ref 20–31)
Chloride: 107 mmol/L (ref 98–110)
Creat: 0.73 mg/dL (ref 0.70–1.11)
GFR, EST NON AFRICAN AMERICAN: 82 mL/min (ref >=60)
Glucose, Bld: 125 mg/dL — ABNORMAL HIGH (ref 65–99)
POTASSIUM: 4.1 mmol/L (ref 3.5–5.3)
SODIUM: 142 mmol/L (ref 135–146)
Total Protein: 6.2 g/dL (ref 6.1–8.1)

## 2017-02-03 LAB — VITAMIN B12: Vitamin B-12: 289 pg/mL (ref 200–1100)

## 2017-02-03 LAB — MICROALBUMIN / CREATININE URINE RATIO: CREATININE, URINE: 52 mg/dL (ref 20–370)

## 2017-02-04 LAB — HEMOGLOBIN A1C
HEMOGLOBIN A1C: 7.2 % — AB (ref <5.7)
MEAN PLASMA GLUCOSE: 160 mg/dL

## 2017-02-08 ENCOUNTER — Encounter: Payer: Self-pay | Admitting: Internal Medicine

## 2017-02-08 ENCOUNTER — Non-Acute Institutional Stay: Payer: Medicare Other | Admitting: Internal Medicine

## 2017-02-08 VITALS — BP 112/64 | HR 73 | Temp 97.5°F | Resp 18 | Ht 65.0 in | Wt 156.8 lb

## 2017-02-08 DIAGNOSIS — E1100 Type 2 diabetes mellitus with hyperosmolarity without nonketotic hyperglycemic-hyperosmolar coma (NKHHC): Secondary | ICD-10-CM | POA: Diagnosis not present

## 2017-02-08 DIAGNOSIS — E785 Hyperlipidemia, unspecified: Secondary | ICD-10-CM | POA: Diagnosis not present

## 2017-02-08 DIAGNOSIS — I10 Essential (primary) hypertension: Secondary | ICD-10-CM

## 2017-02-08 NOTE — Progress Notes (Signed)
Facility  [facility]    Place of Service: Clinic (12)     Allergies  Allergen Reactions  . Metformin And Related   . Sulfa Antibiotics   . Tetanus Toxoids   . Typhoid Vaccines     Chief Complaint  Patient presents with  . Medical Management of Chronic Issues    HPI:  Last seen 10/12/16 for routine management of medical issues. Returns for 6 mo follow up.  Type 2 diabetes mellitus with hyperosmolarity without coma, without long-term current use of insulin ([place]) - controlled on current meds.  Essential hypertension  - controlled  Hyperlipidemia, unspecified hyperlipidemia type - controlled    Medications: Patient's Medications  New Prescriptions   No medications on file  Previous Medications   ASPIRIN 81 MG TABLET    Take 81 mg by mouth daily.   GLUCOSE BLOOD TEST STRIP    Use one strip to test blood sugar once a day before breakfast . Dx E11.9   MICROLET LANCETS MISC    Use one lancet each morning to test blood sugar. Dx. E11.9   PIOGLITAZONE (ACTOS) 45 MG TABLET    TAKE 1 TABLET BY MOUTH EVERY DAY TO CONTROL BLOOD SUGAR   SELENIUM 200 MCG TABS    Take by mouth. Take one tablet daily   SITAGLIPTIN (JANUVIA) 100 MG TABLET    One each morning to control diabetes  Modified Medications   No medications on file  Discontinued Medications   No medications on file     Review of Systems  Constitutional: Negative for activity change, appetite change, chills, diaphoresis, fatigue, fever and unexpected weight change.  HENT: Negative for congestion, dental problem, drooling, ear discharge, ear pain, facial swelling, hearing loss, mouth sores, nosebleeds, rhinorrhea, sinus pressure, sneezing and tinnitus.   Eyes:       Prescription lenses, otherwise normal.  Respiratory: Negative for apnea, cough, choking, chest tightness, shortness of breath, wheezing and stridor.   Cardiovascular: Negative for chest pain, palpitations and leg swelling.  Gastrointestinal: Negative for  abdominal distention, abdominal pain, blood in stool, constipation, diarrhea and nausea.  Endocrine:       History diabetes mellitus on oral medications.  Genitourinary: Negative.  Negative for enuresis, frequency, hematuria and urgency.  Musculoskeletal: Negative for neck pain and neck stiffness.       Pain in the right iliac crest area at night.  Skin: Negative.   Neurological: Negative for dizziness, tremors, seizures, speech difficulty, weakness, light-headedness, numbness and headaches.       Light headed or giddy feelings have resolved. Tingling paresthesias of the right arm from shoulder to fingers.  Hematological: Negative.   Psychiatric/Behavioral: The patient is hyperactive.     Vitals:   02/08/17 0828  BP: 112/64  Pulse: 73  Resp: 18  Temp: 97.5 F (36.4 C)  SpO2: 96%  Weight: 156 lb 12.8 oz (71.1 kg)  Height: '5\' 5"'$  (1.651 m)   Wt Readings from Last 3 Encounters:  02/08/17 156 lb 12.8 oz (71.1 kg)  10/12/16 156 lb (70.8 kg)  09/21/16 159 lb (72.1 kg)    Body mass index is 26.09 kg/m.  Physical Exam  Constitutional: He is oriented to person, place, and time. He appears well-developed and well-nourished.  HENT:  Head: Normocephalic and atraumatic.  Right Ear: External ear normal.  Left Ear: External ear normal.  Nose: Nose normal.  Eyes:  Corrective lenses. Bilateral lens implants.  Neck: Normal range of motion. Neck supple. No JVD present.  No tracheal deviation present. No thyromegaly present.  Cardiovascular: Normal rate, regular rhythm, normal heart sounds and intact distal pulses.  Exam reveals no gallop and no friction rub.   No murmur heard. Pulmonary/Chest: Effort normal and breath sounds normal. No respiratory distress. He has no wheezes. He has no rales. He exhibits no tenderness.  Abdominal: Soft. Bowel sounds are normal. He exhibits no distension and no mass. There is no tenderness.  Musculoskeletal: Normal range of motion. He exhibits no edema or  tenderness.  Lymphadenopathy:    He has no cervical adenopathy.  Neurological: He is alert and oriented to person, place, and time. No cranial nerve deficit. Coordination normal.  Intact vibratory and monofilament testing. Right arm falls slightly when extended and eyes are closed.  Skin: No rash noted. No erythema. No pallor.  Psychiatric: He has a normal mood and affect. His behavior is normal. Judgment and thought content normal.     Labs reviewed: Lab Summary Latest Ref Rng & Units 02/03/2017 09/23/2016 08/29/2016 07/22/2016  Hemoglobin 13.5 - 17.5 g/dL (None) 13.4(A) 13.3 (None)  Hematocrit 41 - 53 % (None) 41 39.5 (None)  White count 10:3/mL (None) 5.4 5.8 (None)  Platelet count 150 - 399 K/L (None) 289 281 (None)  Sodium 135 - 146 mmol/L 142 141 138 142  Potassium 3.5 - 5.3 mmol/L 4.1 4.1 3.6 4.2  Calcium 8.6 - 10.3 mg/dL 8.9 (None) 8.7(L) (None)  Phosphorus - (None) (None) (None) (None)  Creatinine 0.70 - 1.11 mg/dL 0.73 0.8 0.72 0.8  AST 10 - 35 U/L 15 14 (None) 15  Alk Phos 40 - 115 U/L 69 66 (None) 72  Bilirubin 0.2 - 1.2 mg/dL 0.9 (None) (None) (None)  Glucose 65 - 99 mg/dL 125(H) 127 208(H) 143  Cholesterol 0 - 200 mg/dL (None) (None) (None) 127  HDL cholesterol 35 - 70 mg/dL (None) (None) (None) 55  Triglycerides 40 - 160 mg/dL (None) (None) (None) 93  LDL Direct - (None) (None) (None) (None)  LDL Calc mg/dL (None) (None) (None) 53  Total protein 6.1 - 8.1 g/dL 6.2 (None) (None) (None)  Albumin 3.6 - 5.1 g/dL 3.9 (None) (None) (None)  Some recent data might be hidden   No results found for: TSH Lab Results  Component Value Date   BUN 19 02/03/2017   BUN 21 09/23/2016   BUN 22 (H) 08/29/2016   Lab Results  Component Value Date   CREATININE 0.73 02/03/2017   CREATININE 0.8 09/23/2016   CREATININE 0.72 08/29/2016   Lab Results  Component Value Date   HGBA1C 7.2 (H) 02/03/2017   HGBA1C 7.5 07/22/2016   HGBA1C 7.2 (A) 07/21/2015        Assessment/Plan  1. Type 2 diabetes mellitus with hyperosmolarity without coma, without long-term current use of insulin ([facility]CC) - Hemoglobin A1c; Future - Comprehensive metabolic panel; Future - Microalbumin, urine; Future  2. Essential hypertension - Comprehensive metabolic panel; Future  3. Hyperlipidemia, unspecified hyperlipidemia type - Lipid panel; Future

## 2017-02-13 ENCOUNTER — Other Ambulatory Visit: Payer: Self-pay | Admitting: Internal Medicine

## 2017-02-25 DIAGNOSIS — E119 Type 2 diabetes mellitus without complications: Secondary | ICD-10-CM | POA: Diagnosis not present

## 2017-02-25 DIAGNOSIS — H401131 Primary open-angle glaucoma, bilateral, mild stage: Secondary | ICD-10-CM | POA: Diagnosis not present

## 2017-02-25 DIAGNOSIS — Z961 Presence of intraocular lens: Secondary | ICD-10-CM | POA: Diagnosis not present

## 2017-02-25 DIAGNOSIS — H26493 Other secondary cataract, bilateral: Secondary | ICD-10-CM | POA: Diagnosis not present

## 2017-03-22 ENCOUNTER — Other Ambulatory Visit: Payer: Self-pay | Admitting: Internal Medicine

## 2017-03-22 DIAGNOSIS — E119 Type 2 diabetes mellitus without complications: Secondary | ICD-10-CM

## 2017-03-22 DIAGNOSIS — Z794 Long term (current) use of insulin: Principal | ICD-10-CM

## 2017-03-23 DIAGNOSIS — C4441 Basal cell carcinoma of skin of scalp and neck: Secondary | ICD-10-CM | POA: Diagnosis not present

## 2017-03-23 DIAGNOSIS — Z85828 Personal history of other malignant neoplasm of skin: Secondary | ICD-10-CM | POA: Diagnosis not present

## 2017-03-23 DIAGNOSIS — C44319 Basal cell carcinoma of skin of other parts of face: Secondary | ICD-10-CM | POA: Diagnosis not present

## 2017-03-23 DIAGNOSIS — L570 Actinic keratosis: Secondary | ICD-10-CM | POA: Diagnosis not present

## 2017-03-23 DIAGNOSIS — D485 Neoplasm of uncertain behavior of skin: Secondary | ICD-10-CM | POA: Diagnosis not present

## 2017-05-03 ENCOUNTER — Encounter: Payer: Self-pay | Admitting: Internal Medicine

## 2017-05-23 ENCOUNTER — Encounter: Payer: Self-pay | Admitting: Internal Medicine

## 2017-06-01 ENCOUNTER — Encounter: Payer: Self-pay | Admitting: Internal Medicine

## 2017-06-08 ENCOUNTER — Encounter: Payer: Self-pay | Admitting: Internal Medicine

## 2017-06-08 ENCOUNTER — Non-Acute Institutional Stay: Payer: Medicare Other | Admitting: Internal Medicine

## 2017-06-08 VITALS — BP 118/60 | HR 70 | Temp 98.0°F | Resp 16 | Ht 65.0 in | Wt 156.2 lb

## 2017-06-08 DIAGNOSIS — J209 Acute bronchitis, unspecified: Secondary | ICD-10-CM

## 2017-06-08 MED ORDER — DEXTROMETHORPHAN-GUAIFENESIN 5-100 MG/5ML PO LIQD: 10.0000 mL | Freq: Two times a day (BID) | ORAL | 0 refills | Status: DC

## 2017-06-08 MED ORDER — AZITHROMYCIN 250 MG PO TABS: ORAL_TABLET | ORAL | 0 refills | Status: AC

## 2017-06-08 NOTE — Progress Notes (Signed)
Goals of Care:  Advanced Directives 02/08/2017  Does Patient Have a Medical Advance Directive? Yes  Type of Paramedic of [place];Living will  Does patient want to make changes to medical advance directive? No - Patient declined  Copy of [place] in Chart? Yes  Would patient like information on creating a medical advance directive? -     Chief Complaint  Patient presents with  . Medical Management of Chronic Issues    chest congestion x 2 weeks between him and his wife, no shortness of breath, wheezing is present when he lays down. Took some mucinex for 2 days with no relief     HPI: Patient is a 83 y.o. male seen today for an acute visit for cough x 2 weeks. It started dry and now he coughs up yellow-green phlegm. Denies blood in it. He feels congested in his chest and has found himself to be wheezing.   Past Medical History:  Diagnosis Date  . Abdominal pain, other specified site   . Allergic rhinitis due to pollen   . Benign paroxysmal positional vertigo   . Disturbance of skin sensation    left great toe  . Elevated prostate specific antigen (PSA)   . Hematuria, unspecified   . Hypertrophy of prostate without urinary obstruction and other lower urinary tract symptoms (LUTS)   . Impotence of organic origin   . Left knee pain 05/28/2014  . Macrocytosis   . Other and unspecified hyperlipidemia   . Other malaise and fatigue   . Spermatocele    bilateral  . Type II or unspecified type diabetes mellitus without mention of complication, uncontrolled   . Unspecified essential hypertension   . Unspecified glaucoma(365.9)     Past Surgical History:  Procedure Laterality Date  . CATARACT EXTRACTION EXTRACAPSULAR  2008   bilateraly Dr. Katy Fitch  . COLONOSCOPY  01/03/2002   normal Dr. Earlean Shawl  . PROSTATE BIOPSY  1995   due to elevated PSA normal  . TONSILLECTOMY      Allergies  Allergen Reactions  . Metformin And Related   .  Sulfa Antibiotics   . Tetanus Toxoids   . Typhoid Vaccines     Allergies as of 06/08/2017      Reactions   Metformin And Related    Sulfa Antibiotics    Tetanus Toxoids    Typhoid Vaccines       Medication List       Accurate as of 06/08/17 11:02 AM. Always use your most recent med list.          aspirin 81 MG tablet Take 81 mg by mouth daily.   glucose blood test strip Use one strip to test blood sugar once a day before breakfast . Dx E11.9   JANUVIA 100 MG tablet Generic drug:  sitaGLIPtin TAKE 1 TABLET BY MOUTH EVERY MORNING TO CONTROL DIABETES   MICROLET LANCETS Misc Use one lancet each morning to test blood sugar. Dx. E11.9   pioglitazone 45 MG tablet Commonly known as:  ACTOS TAKE 1 TABLET BY MOUTH EVERY DAY TO CONTROL BLOOD SUGAR   Selenium 200 MCG Tabs Take by mouth. Take one tablet daily       Review of Systems:  Review of Systems  Constitutional: Negative for appetite change, chills, diaphoresis, fatigue and fever.  HENT: Positive for congestion, sore throat and voice change. Negative for ear pain, mouth sores, postnasal drip, sinus pain, sinus pressure and trouble swallowing.  Positive for chest congestion, denies sinus congestion. Raspy voice.   Respiratory: Positive for cough and wheezing. Negative for chest tightness and shortness of breath.   Cardiovascular: Negative for chest pain and palpitations.  Gastrointestinal: Negative for abdominal pain, nausea and vomiting.  Skin: Negative for rash.  Neurological: Negative for dizziness and weakness.    Health Maintenance  Topic Date Due  . TETANUS/TDAP  10/07/1943  . PNA vac Low Risk Adult (2 of 2 - PCV13) 10/24/2002  . OPHTHALMOLOGY EXAM  12/31/2016  . INFLUENZA VACCINE  07/20/2017  . FOOT EXAM  07/27/2017  . HEMOGLOBIN A1C  08/03/2017  . URINE MICROALBUMIN  02/03/2018    Physical Exam: Vitals:   06/08/17 1022  BP: 118/60  Pulse: 70  Resp: 16  Temp: 98 F (36.7 C)  TempSrc: Oral   SpO2: 98%  Weight: 156 lb 3.2 oz (70.9 kg)  Height: 5\' 5"  (1.651 m)   Body mass index is 25.99 kg/m. Physical Exam  Constitutional: He is oriented to person, place, and time. He appears well-developed and well-nourished. No distress.  HENT:  Head: Normocephalic and atraumatic.  Right Ear: External ear normal.  Left Ear: External ear normal.  Mouth/Throat: Oropharynx is clear and moist.  Minimal cerumen both ears, oropharyngeal erythema present, no sinus tenderness  Eyes: Conjunctivae are normal. Pupils are equal, round, and reactive to light.  Neck: Normal range of motion. Neck supple.  Cardiovascular: Normal rate and regular rhythm.   Pulmonary/Chest: Effort normal. No respiratory distress. He has wheezes. He exhibits no tenderness.  Decreased air entry to lung bases, occasional rhonchi present  Abdominal: Soft. Bowel sounds are normal.  Musculoskeletal: Normal range of motion.  Lymphadenopathy:    He has no cervical adenopathy.  Neurological: He is alert and oriented to person, place, and time.  Skin: Skin is warm and dry. He is not diaphoretic.  Psychiatric: He has a normal mood and affect.    Labs reviewed: Basic Metabolic Panel:  Recent Labs  08/29/16 0840 09/23/16 02/03/17 0745  NA 138 141 142  K 3.6 4.1 4.1  CL 104  --  107  CO2 28  --  26  GLUCOSE 208*  --  125*  BUN 22* 21 19  CREATININE 0.72 0.8 0.73  CALCIUM 8.7*  --  8.9   Liver Function Tests:  Recent Labs  07/22/16 09/23/16 02/03/17 0745  AST 15 14 15   ALT 9* 7* 7*  ALKPHOS 72 66 69  BILITOT  --   --  0.9  PROT  --   --  6.2  ALBUMIN  --   --  3.9   No results for input(s): LIPASE, AMYLASE in the last 8760 hours. No results for input(s): AMMONIA in the last 8760 hours. CBC:  Recent Labs  08/29/16 0840 09/23/16  WBC 5.8 5.4  NEUTROABS 3.8  --   HGB 13.3 13.4*  HCT 39.5 41  MCV 99.2  --   PLT 281 289   Lipid Panel:  Recent Labs  07/22/16  CHOL 127  HDL 55  LDLCALC 53  TRIG 93     Lab Results  Component Value Date   HGBA1C 7.2 (H) 02/03/2017    Procedures since last visit: No results found.   Assessment/Plan  Acute bronchitis Start azithromycin 5 days course with mucinex dm bid x 1 week and monitor. afebrile at present. If symptoms worsen, will get chest xray to evaluate further. Patient agrees with care plan.    Labs/tests ordered:  None  Next appt:  07/21/2017  Blanchie Serve, MD Internal Medicine [facility] [address] [place], [place] [address] Cell Phone (Monday-Friday 8 am - 5 pm): [phone] On Call: [phone] and follow prompts after 5 pm and on weekends Office Phone: [phone] Office Fax: [phone]

## 2017-06-08 NOTE — Addendum Note (Signed)
Addended byBlanchie Serve on: 06/08/2017 01:16 PM   Modules accepted: Level of Service

## 2017-06-23 ENCOUNTER — Telehealth: Payer: Self-pay

## 2017-06-23 DIAGNOSIS — R059 Cough, unspecified: Secondary | ICD-10-CM

## 2017-06-23 DIAGNOSIS — R05 Cough: Secondary | ICD-10-CM

## 2017-06-23 NOTE — Telephone Encounter (Signed)
Patient was told to call with the status of Zpak, patient states Zpak did not help him at all, symptoms are exactly the same, no relief   Please review and advise

## 2017-06-23 NOTE — Telephone Encounter (Signed)
Spoke with George [name] and he has agreed to do the chest xray. I offered for mobile xray to come to him but he stated that he was well enough to travel to [place] imaging referral order placed. Patient already has appointment scheduled with Dr. Bubba Camp for Wedneday July 11 at 9 am.

## 2017-06-23 NOTE — Telephone Encounter (Signed)
Please have a chest xray ordered PA/lateral view to evaluate further for cough. Also make appointment for patient to see myself or Dinah for next week. Thanks.

## 2017-06-24 ENCOUNTER — Ambulatory Visit
Admission: RE | Admit: 2017-06-24 | Discharge: 2017-06-24 | Disposition: A | Discharge status: Home / Self Care | Payer: Medicare Other | Source: Ambulatory Visit | Attending: Internal Medicine | Admitting: Internal Medicine

## 2017-06-24 DIAGNOSIS — R05 Cough: Secondary | ICD-10-CM | POA: Diagnosis not present

## 2017-06-24 DIAGNOSIS — R059 Cough, unspecified: Secondary | ICD-10-CM

## 2017-06-29 ENCOUNTER — Encounter: Payer: Self-pay | Admitting: Internal Medicine

## 2017-06-29 ENCOUNTER — Non-Acute Institutional Stay: Payer: Medicare Other | Admitting: Internal Medicine

## 2017-06-29 VITALS — BP 110/60 | HR 67 | Temp 97.6°F | Resp 18 | Ht 65.0 in | Wt 156.2 lb

## 2017-06-29 DIAGNOSIS — E785 Hyperlipidemia, unspecified: Secondary | ICD-10-CM | POA: Diagnosis not present

## 2017-06-29 DIAGNOSIS — E119 Type 2 diabetes mellitus without complications: Secondary | ICD-10-CM | POA: Diagnosis not present

## 2017-06-29 DIAGNOSIS — R062 Wheezing: Secondary | ICD-10-CM | POA: Diagnosis not present

## 2017-06-29 NOTE — Progress Notes (Signed)
Goals of Care:  Advanced Directives 02/08/2017  Does Patient Have a Medical Advance Directive? Yes  Type of Paramedic of [place];Living will  Does patient want to make changes to medical advance directive? No - Patient declined  Copy of [place] in Chart? Yes  Would patient like information on creating a medical advance directive? -     Chief Complaint  Patient presents with  . Medical Management of Chronic Issues    Follow up on chest xray.   . Medication Management    Doesnt need any refills at this time    HPI: Patient is a 83 y.o. male seen today for follow up on cough. He was treated for acute bronchitis 2 weeks back with z pack and has felt improvement. Cough has resolved. no runny nose or sore throat. Appetite is good. He occasionally finds himself to be wheezing while lying down.  Past Medical History:  Diagnosis Date  . Abdominal pain, other specified site   . Allergic rhinitis due to pollen   . Benign paroxysmal positional vertigo   . Disturbance of skin sensation    left great toe  . Elevated prostate specific antigen (PSA)   . Hematuria, unspecified   . Hypertrophy of prostate without urinary obstruction and other lower urinary tract symptoms (LUTS)   . Impotence of organic origin   . Left knee pain 05/28/2014  . Macrocytosis   . Other and unspecified hyperlipidemia   . Other malaise and fatigue   . Spermatocele    bilateral  . Type II or unspecified type diabetes mellitus without mention of complication, uncontrolled   . Unspecified essential hypertension   . Unspecified glaucoma(365.9)     Past Surgical History:  Procedure Laterality Date  . CATARACT EXTRACTION EXTRACAPSULAR  2008   bilateraly Dr. Katy Fitch  . COLONOSCOPY  01/03/2002   normal Dr. Earlean Shawl  . PROSTATE BIOPSY  1995   due to elevated PSA normal  . TONSILLECTOMY      Allergies  Allergen Reactions  . Metformin And Related   . Sulfa  Antibiotics   . Tetanus Toxoids   . Typhoid Vaccines     Allergies as of 06/29/2017      Reactions   Metformin And Related    Sulfa Antibiotics    Tetanus Toxoids    Typhoid Vaccines       Medication List       Accurate as of 06/29/17  9:12 AM. Always use your most recent med list.          aspirin 81 MG tablet Take 81 mg by mouth daily.   glucose blood test strip Use one strip to test blood sugar once a day before breakfast . Dx E11.9   JANUVIA 100 MG tablet Generic drug:  sitaGLIPtin TAKE 1 TABLET BY MOUTH EVERY MORNING TO CONTROL DIABETES   MICROLET LANCETS Misc Use one lancet each morning to test blood sugar. Dx. E11.9   pioglitazone 45 MG tablet Commonly known as:  ACTOS TAKE 1 TABLET BY MOUTH EVERY DAY TO CONTROL BLOOD SUGAR   Selenium 200 MCG Tabs Take by mouth. Take one tablet daily       Review of Systems:  Review of Systems  Constitutional: Negative for appetite change, chills, diaphoresis, fatigue and fever.  HENT: Negative for congestion, ear pain, mouth sores, postnasal drip, sinus pain, sinus pressure, sore throat, trouble swallowing and voice change.   Respiratory: Positive for wheezing. Negative  for cough, chest tightness and shortness of breath.   Cardiovascular: Negative for chest pain and palpitations.  Gastrointestinal: Negative for nausea and vomiting.  Skin: Negative for rash.  Neurological: Negative for dizziness and weakness.    Health Maintenance  Topic Date Due  . OPHTHALMOLOGY EXAM  12/20/2017 (Originally 12/31/2016)  . TETANUS/TDAP  12/20/2017 (Originally 10/07/1943)  . PNA vac Low Risk Adult (2 of 2 - PCV13) 12/20/2017 (Originally 10/24/2002)  . INFLUENZA VACCINE  07/20/2017  . FOOT EXAM  07/27/2017  . HEMOGLOBIN A1C  08/03/2017  . URINE MICROALBUMIN  02/03/2018    Physical Exam: Vitals:   06/29/17 0853  BP: 110/60  Pulse: 67  Resp: 18  Temp: 97.6 F (36.4 C)  TempSrc: Oral  SpO2: 97%  Weight: 156 lb 3.2 oz (70.9 kg)   Height: 5\' 5"  (1.651 m)   Body mass index is 25.99 kg/m. Physical Exam  Constitutional: He is oriented to person, place, and time. He appears well-developed and well-nourished. No distress.  HENT:  Head: Normocephalic and atraumatic.  Mouth/Throat: Oropharynx is clear and moist.  Eyes: Conjunctivae are normal. Pupils are equal, round, and reactive to light.  Neck: Normal range of motion. Neck supple.  Cardiovascular: Normal rate and regular rhythm.   Pulmonary/Chest: Effort normal and breath sounds normal. No respiratory distress. He has no wheezes. He exhibits no tenderness.  Abdominal: Soft. Bowel sounds are normal.  Musculoskeletal: Normal range of motion.  Lymphadenopathy:    He has no cervical adenopathy.  Neurological: He is alert and oriented to person, place, and time.  Skin: Skin is warm and dry. He is not diaphoretic.  Psychiatric: He has a normal mood and affect.    Labs reviewed: Basic Metabolic Panel:  Recent Labs  08/29/16 0840 09/23/16 02/03/17 0745  NA 138 141 142  K 3.6 4.1 4.1  CL 104  --  107  CO2 28  --  26  GLUCOSE 208*  --  125*  BUN 22* 21 19  CREATININE 0.72 0.8 0.73  CALCIUM 8.7*  --  8.9   Liver Function Tests:  Recent Labs  07/22/16 09/23/16 02/03/17 0745  AST 15 14 15   ALT 9* 7* 7*  ALKPHOS 72 66 69  BILITOT  --   --  0.9  PROT  --   --  6.2  ALBUMIN  --   --  3.9   No results for input(s): LIPASE, AMYLASE in the last 8760 hours. No results for input(s): AMMONIA in the last 8760 hours. CBC:  Recent Labs  08/29/16 0840 09/23/16  WBC 5.8 5.4  NEUTROABS 3.8  --   HGB 13.3 13.4*  HCT 39.5 41  MCV 99.2  --   PLT 281 289   Lipid Panel:  Recent Labs  07/22/16  CHOL 127  HDL 55  LDLCALC 53  TRIG 93   Lab Results  Component Value Date   HGBA1C 7.2 (H) 02/03/2017    Procedures since last visit: Dg Chest 2 View  Result Date: 06/24/2017 CLINICAL DATA:  Productive cough for 1 month, fever, congestion, type II diabetes  mellitus, essential hypertension, former smoker EXAM: CHEST  2 VIEW COMPARISON:  None FINDINGS: Upper normal heart size. Tortuous aorta with atherosclerotic calcification. Mediastinal contours and pulmonary vascularity normal. Lungs clear. No pleural effusion or pneumothorax. Diffuse osseous demineralization with scattered endplate spur formation thoracic spine. IMPRESSION: No acute abnormalities. Aortic Atherosclerosis (ICD10-I70.0). Electronically Signed   By: Lavonia Dana M.D.   On: 06/24/2017 09:57  Assessment/Plan  wheezing Resolving with clinical improvement of his acute bronchitis. Completed antibiotic. Occasional wheezing reported. Will monitor clinically for now.   HLD Will check lipid panel prior to next visit. Has hx of DM  DM Does not check blood sugar at home. No recent a1c. Has upcoming RV, will get a1c checked prior to this. uptodate with eye exam. Check urine microalbumin as well.    Labs/tests ordered:  A1c, lipid panel   Next appt:  4 weeks for RV  [facility], MD Internal Medicine [facility] [address] [place], [place] [address] Cell Phone (Monday-Friday 8 am - 5 pm): [phone] On Call: [phone] and follow prompts after 5 pm and on weekends Office Phone: [phone] Office Fax: [phone]

## 2017-07-07 ENCOUNTER — Encounter: Payer: Self-pay | Admitting: Family

## 2017-07-07 ENCOUNTER — Non-Acute Institutional Stay: Payer: Medicare Other | Admitting: Family

## 2017-07-07 ENCOUNTER — Telehealth: Payer: Self-pay

## 2017-07-07 VITALS — BP 124/68 | HR 76 | Temp 97.9°F | Resp 18 | Ht 65.0 in | Wt 156.0 lb

## 2017-07-07 DIAGNOSIS — E1100 Type 2 diabetes mellitus with hyperosmolarity without nonketotic hyperglycemic-hyperosmolar coma (NKHHC): Secondary | ICD-10-CM

## 2017-07-07 DIAGNOSIS — R42 Dizziness and giddiness: Secondary | ICD-10-CM

## 2017-07-07 NOTE — Telephone Encounter (Signed)
appt with Dinah today at 2:00.

## 2017-07-07 NOTE — Progress Notes (Signed)
Location:  [place] of Service:  Clinic (12) Provider: Pinky Ravan FNP-C  Blanchie Serve, MD  Patient Care Team: Blanchie Serve, MD as PCP - General (Internal Medicine) Clent Jacks, MD as Consulting Physician (Ophthalmology) Lindwood Coke, MD as Consulting Physician (Dermatology) Thornell Sartorius, MD as Consulting Physician (Otolaryngology) Richmond Campbell, MD as Consulting Physician (Gastroenterology) Melina Modena, [facility]  Extended Emergency Contact Information Primary Emergency Contact: Priebe,Peggy Address: [id number]          [place] [address] [place] Phone: [phone] Relation: Spouse  Goals of care: Advanced Directive information Advanced Directives 07/07/2017  Does Patient Have a Medical Advance Directive? Yes  Type of Paramedic of [place];Living will  Does patient want to make changes to medical advance directive? -  Copy of [place] in Chart? Yes  Would patient like information on creating a medical advance directive? -     Chief Complaint  Patient presents with  . Acute Visit    got up at 5:00 am was dizzy, head spinng, felt weak. Nurse was called at 134/78,, 88, 20, 96.8, O2 98%, BS 150. Patient feels light headed now. Has not checked BS for some time. Goes by A1c     HPI:  Pt is a 83 y.o. male seen today at [facility] for an acute visit for evaluation of dizziness.He is seen in the clinic with wife present.He has a significant medical history of HTN,dizziness among other conditions.He states woke up this morning at 5:00 Am felt dizzy, head spinning and weak. He called Nurse B/P checked was 134/78 and blood sugar 150. He states has improved since the morning episode but he continues to feel dizzy whenever he stands. He was able to walk from his apartment to the clinic without any difficulties.He denies any fever,chills, cough, headache, visual changes,  ringing of ears,chest pain or shortness of breath.   Past Medical History:  Diagnosis Date  . Abdominal pain, other specified site   . Allergic rhinitis due to pollen   . Benign paroxysmal positional vertigo   . Disturbance of skin sensation    left great toe  . Elevated prostate specific antigen (PSA)   . Hematuria, unspecified   . Hypertrophy of prostate without urinary obstruction and other lower urinary tract symptoms (LUTS)   . Impotence of organic origin   . Left knee pain 05/28/2014  . Macrocytosis   . Other and unspecified hyperlipidemia   . Other malaise and fatigue   . Spermatocele    bilateral  . Type II or unspecified type diabetes mellitus without mention of complication, uncontrolled   . Unspecified essential hypertension   . Unspecified glaucoma(365.9)    Past Surgical History:  Procedure Laterality Date  . CATARACT EXTRACTION EXTRACAPSULAR  2008   bilateraly Dr. Katy Fitch  . COLONOSCOPY  01/03/2002   normal Dr. Earlean Shawl  . PROSTATE BIOPSY  1995   due to elevated PSA normal  . TONSILLECTOMY      Allergies  Allergen Reactions  . Metformin And Related   . Sulfa Antibiotics   . Tetanus Toxoids   . Typhoid Vaccines     Allergies as of 07/07/2017      Reactions   Metformin And Related    Sulfa Antibiotics    Tetanus Toxoids    Typhoid Vaccines       Medication List       Accurate as of 07/07/17  4:52  PM. Always use your most recent med list.          aspirin 81 MG tablet Take 81 mg by mouth daily.   glucose blood test strip Use one strip to test blood sugar once a day before breakfast . Dx E11.9   JANUVIA 100 MG tablet Generic drug:  sitaGLIPtin TAKE 1 TABLET BY MOUTH EVERY MORNING TO CONTROL DIABETES   MICROLET LANCETS Misc Use one lancet each morning to test blood sugar. Dx. E11.9   pioglitazone 45 MG tablet Commonly known as:  ACTOS TAKE 1 TABLET BY MOUTH EVERY DAY TO CONTROL BLOOD SUGAR   Selenium 200 MCG Tabs Take by mouth. Take one  tablet daily       Review of Systems  Constitutional: Negative for activity change, appetite change, chills, fatigue and fever.  HENT: Negative for congestion, ear pain, rhinorrhea, sinus pain, sinus pressure, sneezing and sore throat.   Eyes: Negative for pain, discharge, redness, itching and visual disturbance.  Respiratory: Negative for cough, chest tightness, shortness of breath and wheezing.   Cardiovascular: Negative for chest pain, palpitations and leg swelling.  Gastrointestinal: Negative for abdominal distention, abdominal pain, constipation, diarrhea, nausea and vomiting.  Endocrine: Negative.   Genitourinary: Negative for dysuria, frequency and urgency.  Skin: Negative for color change, pallor and rash.  Neurological: Positive for dizziness. Negative for seizures, syncope, weakness, numbness and headaches.  Psychiatric/Behavioral: Negative for agitation, confusion, hallucinations and sleep disturbance. The patient is not nervous/anxious.     Immunization History  Administered Date(s) Administered  . Influenza Whole 09/19/2012, 09/20/2013  . Influenza-Unspecified 10/03/2014, 09/18/2015, 09/30/2016  . Pneumococcal Polysaccharide-23 10/24/2001   Pertinent  Health Maintenance Due  Topic Date Due  . OPHTHALMOLOGY EXAM  12/20/2017 (Originally 12/31/2016)  . PNA vac Low Risk Adult (2 of 2 - PCV13) 12/20/2017 (Originally 10/24/2002)  . INFLUENZA VACCINE  07/20/2017  . FOOT EXAM  07/27/2017  . HEMOGLOBIN A1C  08/03/2017  . URINE MICROALBUMIN  02/03/2018   Fall Risk  07/27/2016 01/27/2016 07/22/2015 01/28/2015 01/22/2014  Falls in the past year? No No No No No    Vitals:   07/07/17 1359  BP: 124/68  Pulse: 76  Resp: 18  Temp: 97.9 F (36.6 C)  TempSrc: Oral  SpO2: 98%  Weight: 156 lb (70.8 kg)  Height: 5\' 5"  (1.651 m)   Body mass index is 25.96 kg/m. Physical Exam  Constitutional: He is oriented to person, place, and time. He appears well-developed and well-nourished. No  distress.  HENT:  Head: Normocephalic.  Right Ear: External ear normal.  Left Ear: External ear normal.  Mouth/Throat: Oropharynx is clear and moist. No oropharyngeal exudate.  Eyes: Pupils are equal, round, and reactive to light. Conjunctivae and EOM are normal. Right eye exhibits no discharge. Left eye exhibits no discharge. No scleral icterus.  Neck: Normal range of motion. No JVD present. No thyromegaly present.  Cardiovascular: Normal rate, regular rhythm, normal heart sounds and intact distal pulses.  Exam reveals no gallop and no friction rub.   No murmur heard. Pulmonary/Chest: Effort normal and breath sounds normal. No respiratory distress. He has no wheezes. He has no rales.  Abdominal: Soft. Bowel sounds are normal. He exhibits no distension. There is no tenderness. There is no rebound and no guarding.  Musculoskeletal: Normal range of motion. He exhibits no edema, tenderness or deformity.  Lymphadenopathy:    He has no cervical adenopathy.  Neurological: He is oriented to person, place, and time. No cranial nerve deficit.  Coordination normal.  Skin: Skin is warm and dry. No rash noted. No erythema. No pallor.  Psychiatric: He has a normal mood and affect.   Labs reviewed:  Recent Labs  08/29/16 0840 09/23/16 02/03/17 0745  NA 138 141 142  K 3.6 4.1 4.1  CL 104  --  107  CO2 28  --  26  GLUCOSE 208*  --  125*  BUN 22* 21 19  CREATININE 0.72 0.8 0.73  CALCIUM 8.7*  --  8.9    Recent Labs  07/22/16 09/23/16 02/03/17 0745  AST 15 14 15   ALT 9* 7* 7*  ALKPHOS 72 66 69  BILITOT  --   --  0.9  PROT  --   --  6.2  ALBUMIN  --   --  3.9    Recent Labs  08/29/16 0840 09/23/16  WBC 5.8 5.4  NEUTROABS 3.8  --   HGB 13.3 13.4*  HCT 39.5 41  MCV 99.2  --   PLT 281 289   No results found for: TSH Lab Results  Component Value Date   HGBA1C 7.2 (H) 02/03/2017   Lab Results  Component Value Date   CHOL 127 07/22/2016   HDL 55 07/22/2016   LDLCALC 53  07/22/2016   TRIG 93 07/22/2016    Significant Diagnostic Results in last 30 days:  Dg Chest 2 View  Result Date: 06/24/2017 CLINICAL DATA:  Productive cough for 1 month, fever, congestion, type II diabetes mellitus, essential hypertension, former smoker EXAM: CHEST  2 VIEW COMPARISON:  None FINDINGS: Upper normal heart size. Tortuous aorta with atherosclerotic calcification. Mediastinal contours and pulmonary vascularity normal. Lungs clear. No pleural effusion or pneumothorax. Diffuse osseous demineralization with scattered endplate spur formation thoracic spine. IMPRESSION: No acute abnormalities. Aortic Atherosclerosis (ICD10-I70.0). Electronically Signed   By: Lavonia Dana M.D.   On: 06/24/2017 09:57   Assessment/Plan   Dizziness Afebrile.No hypoglycemia. Exam findings negative.Will check CBC/diff, CMP and Hgb A1C.Fall and safety precautions discussed with patient. Instructed to get evaluation in the ER if sypmtoms worsen.   Family/ staff Communication: Reviewed plan of care with patient,wife and facility Nurse.   Labs/tests ordered: CBC/diff, CMP and Hgb A1C 07/08/2017.   Sandrea Hughs, NP

## 2017-07-07 NOTE — Telephone Encounter (Signed)
Patient with dizziness and near syncope episodes this morning and would like to be seen  I contacted Dinah, NP and she will see patient today

## 2017-07-08 ENCOUNTER — Other Ambulatory Visit: Payer: Medicare Other

## 2017-07-08 DIAGNOSIS — E1100 Type 2 diabetes mellitus with hyperosmolarity without nonketotic hyperglycemic-hyperosmolar coma (NKHHC): Secondary | ICD-10-CM

## 2017-07-08 DIAGNOSIS — R42 Dizziness and giddiness: Secondary | ICD-10-CM

## 2017-07-08 LAB — COMPLETE METABOLIC PANEL WITH GFR
ALK PHOS: 69 U/L (ref 40–115)
ALT: 9 U/L (ref 9–46)
AST: 15 U/L (ref 10–35)
Albumin: 3.9 g/dL (ref 3.6–5.1)
BILIRUBIN TOTAL: 1 mg/dL (ref 0.2–1.2)
BUN: 19 mg/dL (ref 7–25)
CO2: 26 mmol/L (ref 20–31)
Calcium: 8.6 mg/dL (ref 8.6–10.3)
Chloride: 105 mmol/L (ref 98–110)
Creat: 0.83 mg/dL (ref 0.70–1.11)
GFR, EST NON AFRICAN AMERICAN: 77 mL/min (ref >=60)
GLUCOSE: 142 mg/dL — AB (ref 65–99)
POTASSIUM: 4.2 mmol/L (ref 3.5–5.3)
SODIUM: 139 mmol/L (ref 135–146)
TOTAL PROTEIN: 6.1 g/dL (ref 6.1–8.1)

## 2017-07-08 LAB — CBC WITH DIFFERENTIAL/PLATELET
BASOS ABS: 55 {cells}/uL (ref 0–200)
Basophils Relative: 1 %
EOS ABS: 110 {cells}/uL (ref 15–500)
EOS PCT: 2 %
HCT: 40.2 % (ref 38.5–50.0)
Hemoglobin: 13 g/dL — ABNORMAL LOW (ref 13.2–17.1)
Lymphocytes Relative: 26 %
Lymphs Abs: 1430 cells/uL (ref 850–3900)
MCH: 32.6 pg (ref 27.0–33.0)
MCHC: 32.3 g/dL (ref 32.0–36.0)
MCV: 100.8 fL — AB (ref 80.0–100.0)
MONOS PCT: 13 %
MPV: 9 fL (ref 7.5–12.5)
Monocytes Absolute: 715 cells/uL (ref 200–950)
NEUTROS PCT: 58 %
Neutro Abs: 3190 cells/uL (ref 1500–7800)
PLATELETS: 311 10*3/uL (ref 140–400)
RBC: 3.99 MIL/uL — ABNORMAL LOW (ref 4.20–5.80)
RDW: 17.7 % — ABNORMAL HIGH (ref 11.0–15.0)
WBC: 5.5 10*3/uL (ref 3.8–10.8)

## 2017-07-09 LAB — HEMOGLOBIN A1C
Hgb A1c MFr Bld: 7.3 % — ABNORMAL HIGH (ref <5.7)
MEAN PLASMA GLUCOSE: 163 mg/dL

## 2017-07-11 ENCOUNTER — Other Ambulatory Visit: Payer: Self-pay

## 2017-07-25 DIAGNOSIS — E785 Hyperlipidemia, unspecified: Secondary | ICD-10-CM | POA: Diagnosis not present

## 2017-07-25 DIAGNOSIS — E119 Type 2 diabetes mellitus without complications: Secondary | ICD-10-CM | POA: Diagnosis not present

## 2017-07-25 LAB — LIPID PANEL
CHOL/HDL RATIO: 2.1 ratio (ref <5.0)
CHOLESTEROL: 126 mg/dL (ref <200)
HDL: 59 mg/dL (ref >40)
LDL CALC: 51 mg/dL (ref <100)
Triglycerides: 78 mg/dL (ref <150)
VLDL: 16 mg/dL (ref <30)

## 2017-07-25 LAB — CBC WITH DIFFERENTIAL/PLATELET
BASOS PCT: 0 %
Basophils Absolute: 0 cells/uL (ref 0–200)
EOS ABS: 134 {cells}/uL (ref 15–500)
EOS PCT: 2 %
HCT: 40.5 % (ref 38.5–50.0)
HEMOGLOBIN: 13.5 g/dL (ref 13.2–17.1)
LYMPHS PCT: 25 %
Lymphs Abs: 1675 cells/uL (ref 850–3900)
MCH: 33.3 pg — ABNORMAL HIGH (ref 27.0–33.0)
MCHC: 33.3 g/dL (ref 32.0–36.0)
MCV: 100 fL (ref 80.0–100.0)
MPV: 8.8 fL (ref 7.5–12.5)
Monocytes Absolute: 737 cells/uL (ref 200–950)
Monocytes Relative: 11 %
NEUTROS ABS: 4154 {cells}/uL (ref 1500–7800)
Neutrophils Relative %: 62 %
PLATELETS: 292 10*3/uL (ref 140–400)
RBC: 4.05 MIL/uL — ABNORMAL LOW (ref 4.20–5.80)
RDW: 17.7 % — ABNORMAL HIGH (ref 11.0–15.0)
WBC: 6.7 10*3/uL (ref 3.8–10.8)

## 2017-07-25 LAB — HEMOGLOBIN A1C
HEMOGLOBIN A1C: 7.1 % — AB (ref <5.7)
Mean Plasma Glucose: 157 mg/dL

## 2017-07-25 LAB — COMPLETE METABOLIC PANEL WITH GFR
ALT: 8 U/L — AB (ref 9–46)
AST: 15 U/L (ref 10–35)
Albumin: 4.1 g/dL (ref 3.6–5.1)
Alkaline Phosphatase: 70 U/L (ref 40–115)
BUN: 20 mg/dL (ref 7–25)
CHLORIDE: 104 mmol/L (ref 98–110)
CO2: 27 mmol/L (ref 20–32)
CREATININE: 0.76 mg/dL (ref 0.70–1.11)
Calcium: 8.9 mg/dL (ref 8.6–10.3)
GFR, Est African American: >89 mL/min (ref >=60)
GFR, Est Non African American: 80 mL/min (ref >=60)
GLUCOSE: 113 mg/dL — AB (ref 65–99)
POTASSIUM: 4 mmol/L (ref 3.5–5.3)
SODIUM: 141 mmol/L (ref 135–146)
Total Bilirubin: 0.8 mg/dL (ref 0.2–1.2)
Total Protein: 6.4 g/dL (ref 6.1–8.1)

## 2017-07-26 ENCOUNTER — Encounter: Payer: Medicare Other | Admitting: Internal Medicine

## 2017-07-26 LAB — MICROALBUMIN / CREATININE URINE RATIO
CREATININE, URINE: 120 mg/dL (ref 20–370)
Microalb Creat Ratio: 4 mcg/mg creat (ref <30)
Microalb, Ur: 0.5 mg/dL

## 2017-07-27 ENCOUNTER — Encounter: Payer: Medicare Other | Admitting: Internal Medicine

## 2017-07-28 ENCOUNTER — Other Ambulatory Visit: Payer: Medicare Other

## 2017-07-28 ENCOUNTER — Encounter: Payer: Medicare Other | Admitting: Internal Medicine

## 2017-08-03 ENCOUNTER — Non-Acute Institutional Stay: Payer: Medicare Other | Admitting: Internal Medicine

## 2017-08-03 ENCOUNTER — Encounter: Payer: Self-pay | Admitting: Internal Medicine

## 2017-08-03 VITALS — BP 118/64 | HR 74 | Temp 97.5°F | Resp 18 | Ht 65.0 in | Wt 155.0 lb

## 2017-08-03 DIAGNOSIS — R350 Frequency of micturition: Secondary | ICD-10-CM | POA: Diagnosis not present

## 2017-08-03 DIAGNOSIS — E782 Mixed hyperlipidemia: Secondary | ICD-10-CM | POA: Diagnosis not present

## 2017-08-03 DIAGNOSIS — E118 Type 2 diabetes mellitus with unspecified complications: Secondary | ICD-10-CM

## 2017-08-03 DIAGNOSIS — N401 Enlarged prostate with lower urinary tract symptoms: Secondary | ICD-10-CM

## 2017-08-03 DIAGNOSIS — I10 Essential (primary) hypertension: Secondary | ICD-10-CM | POA: Diagnosis not present

## 2017-08-03 DIAGNOSIS — H8113 Benign paroxysmal vertigo, bilateral: Secondary | ICD-10-CM

## 2017-08-03 NOTE — Progress Notes (Signed)
[facility]   Goals of Care: full code  Advanced Directives 07/07/2017  Does Patient Have a Medical Advance Directive? Yes  Type of Paramedic of [place];Living will  Does patient want to make changes to medical advance directive? -  Copy of [place] in Chart? Yes  Would patient like information on creating a medical advance directive? -     Chief Complaint  Patient presents with  . Acute Visit    4 week follow up. Felt like he was light headed on Tuesday that last 5 mins. It happened after he ate but him walking help subsided the light headness.  . Medication Refill    No refills needed at this time  . Results    Discuss labs with the patient    HPI: Patient is a 83 y.o. male seen today for routine visit. He complaints of dizziness  Dizziness- feels lightheaded with change of position esp from lying to standing or from sitting to standing. It goes away after standing for a bit. No fall reported. Denies tinnitus. Does not happen at rest.   Cough- resolved.   DM- no blood sugar for review. Currently on januvia 100 mg daily and pioglitazone 45 mg daily. Tolerating it well. Denies polyuria, polydypsia or polyphagia. a1c reviewed 7.1. Check urine for microalbumin and have results for review.  HTN- not on medication. Controlled BP. Currently on aspirin.   BPH- currently has increased urinary frequency, gets up once at night to urinate. No urinary hesitancy.    Past Medical History:  Diagnosis Date  . Abdominal pain, other specified site   . Allergic rhinitis due to pollen   . Benign paroxysmal positional vertigo   . Disturbance of skin sensation    left great toe  . Elevated prostate specific antigen (PSA)   . Hematuria, unspecified   . Hypertrophy of prostate without urinary obstruction and other lower urinary tract symptoms (LUTS)   . Impotence of organic origin   . Left knee pain 05/28/2014  . Macrocytosis   .  Other and unspecified hyperlipidemia   . Other malaise and fatigue   . Spermatocele    bilateral  . Type II or unspecified type diabetes mellitus without mention of complication, uncontrolled   . Unspecified essential hypertension   . Unspecified glaucoma(365.9)     Past Surgical History:  Procedure Laterality Date  . CATARACT EXTRACTION EXTRACAPSULAR  2008   bilateraly Dr. Katy Fitch  . COLONOSCOPY  01/03/2002   normal Dr. Earlean Shawl  . PROSTATE BIOPSY  1995   due to elevated PSA normal  . TONSILLECTOMY      Allergies  Allergen Reactions  . Metformin And Related   . Sulfa Antibiotics   . Tetanus Toxoids   . Typhoid Vaccines     Allergies as of 08/03/2017      Reactions   Metformin And Related    Sulfa Antibiotics    Tetanus Toxoids    Typhoid Vaccines       Medication List       Accurate as of 08/03/17  8:39 AM. Always use your most recent med list.          aspirin 81 MG tablet Take 81 mg by mouth daily.   glucose blood test strip Use one strip to test blood sugar once a day before breakfast . Dx E11.9   JANUVIA 100 MG tablet Generic drug:  sitaGLIPtin TAKE 1 TABLET BY MOUTH EVERY MORNING TO  CONTROL DIABETES   MICROLET LANCETS Misc Use one lancet each morning to test blood sugar. Dx. E11.9   pioglitazone 45 MG tablet Commonly known as:  ACTOS TAKE 1 TABLET BY MOUTH EVERY DAY TO CONTROL BLOOD SUGAR   Selenium 200 MCG Tabs Take by mouth. Take one tablet daily       Review of Systems:  Review of Systems  Constitutional: Negative for appetite change, chills, diaphoresis, fatigue and fever.  HENT: Positive for hearing loss. Negative for congestion, ear pain, mouth sores, postnasal drip, sinus pain, sinus pressure, sore throat, tinnitus and trouble swallowing.        Has had hearing test and was provided with hearing aid, does not use it and mentions hearing fine  Eyes: Positive for visual disturbance. Negative for pain and itching.       Wears corrective  glasses  Respiratory: Negative for cough, chest tightness, shortness of breath and wheezing.   Cardiovascular: Negative for chest pain, palpitations and leg swelling.  Gastrointestinal: Negative for abdominal pain, blood in stool, constipation, nausea and vomiting.       Denies heartburn  Genitourinary: Positive for frequency. Negative for difficulty urinating, dysuria and hematuria.  Musculoskeletal: Negative for arthralgias, back pain and gait problem.  Skin: Negative for rash and wound.  Neurological: Positive for light-headedness. Negative for dizziness, tremors, syncope, weakness, numbness and headaches.  Hematological: Does not bruise/bleed easily.  Psychiatric/Behavioral: Negative for behavioral problems, confusion, dysphoric mood, hallucinations and sleep disturbance. The patient is not nervous/anxious.     Health Maintenance  Topic Date Due  . INFLUENZA VACCINE  09/19/2017 (Originally 07/20/2017)  . FOOT EXAM  09/19/2017 (Originally 07/27/2017)  . OPHTHALMOLOGY EXAM  12/20/2017 (Originally 12/31/2016)  . TETANUS/TDAP  12/20/2017 (Originally 10/07/1943)  . PNA vac Low Risk Adult (2 of 2 - PCV13) 12/20/2017 (Originally 10/24/2002)  . HEMOGLOBIN A1C  01/25/2018  . URINE MICROALBUMIN  07/25/2018    Physical Exam: Vitals:   08/03/17 0817  BP: 118/64  Pulse: 74  Resp: 18  Temp: (!) 97.5 F (36.4 C)  TempSrc: Oral  SpO2: 97%  Weight: 155 lb (70.3 kg)  Height: '5\' 5"'$  (1.651 m)   Body mass index is 25.79 kg/m. Physical Exam  Constitutional: He is oriented to person, place, and time. He appears well-developed and well-nourished. No distress.  HENT:  Head: Normocephalic and atraumatic.  Right Ear: External ear normal.  Left Ear: External ear normal.  Nose: Nose normal.  Mouth/Throat: Oropharynx is clear and moist. No oropharyngeal exudate.  Eyes: Pupils are equal, round, and reactive to light. Conjunctivae are normal. Right eye exhibits no discharge. Left eye exhibits no  discharge.  Neck: Normal range of motion. Neck supple. No JVD present. No thyromegaly present.  Cardiovascular: Normal rate and regular rhythm.   No murmur heard. Pulmonary/Chest: Effort normal and breath sounds normal. No respiratory distress. He has no wheezes. He has no rales. He exhibits no tenderness.  Abdominal: Soft. Bowel sounds are normal. He exhibits no distension. There is no tenderness. There is no rebound and no guarding.  Musculoskeletal: Normal range of motion. He exhibits no edema.  Arthritis changes to his hands  Lymphadenopathy:    He has no cervical adenopathy.  Neurological: He is alert and oriented to person, place, and time. Coordination normal.  Skin: Skin is warm and dry. No rash noted. He is not diaphoretic.  Psychiatric: He has a normal mood and affect. His behavior is normal.    Labs reviewed: Basic Metabolic Panel:  Recent Labs  02/03/17 0745 07/08/17 0807 07/25/17 0800  NA 142 139 141  K 4.1 4.2 4.0  CL 107 105 104  CO2 '26 26 27  '$ GLUCOSE 125* 142* 113*  BUN '19 19 20  '$ CREATININE 0.73 0.83 0.76  CALCIUM 8.9 8.6 8.9   Liver Function Tests:  Recent Labs  02/03/17 0745 07/08/17 0807 07/25/17 0800  AST '15 15 15  '$ ALT 7* 9 8*  ALKPHOS 69 69 70  BILITOT 0.9 1.0 0.8  PROT 6.2 6.1 6.4  ALBUMIN 3.9 3.9 4.1   No results for input(s): LIPASE, AMYLASE in the last 8760 hours. No results for input(s): AMMONIA in the last 8760 hours. CBC:  Recent Labs  08/29/16 0840 09/23/16 07/08/17 0807 07/25/17 0800  WBC 5.8 5.4 5.5 6.7  NEUTROABS 3.8  --  3,190 4,154  HGB 13.3 13.4* 13.0* 13.5  HCT 39.5 41 40.2 40.5  MCV 99.2  --  100.8* 100.0  PLT 281 289 311 292   Lipid Panel:  Recent Labs  07/25/17 0800  CHOL 126  HDL 59  LDLCALC 51  TRIG 78  CHOLHDL 2.1   Lab Results  Component Value Date   HGBA1C 7.1 (H) 07/25/2017    Procedures since last visit: No results found.   Assessment/Plan  1. Essential hypertension Controlled BP, not on  medications, continue baby aspirin. Reviewed BMP, stable.  - BMP with eGFR; Future  2. Type 2 diabetes mellitus with complication, without long-term current use of insulin (HCC) Negative for microalbuminuria. a1c suggestive of controlled DM. Continue januvia and actos for now. Hypoglycemia symptom explained. - BMP with eGFR; Future - Hemoglobin A1c; Future  3. Benign paroxysmal positional vertigo due to bilateral vestibular disorder Slow position change encouraged. Fall prevention. Encouraged hydration. If no improvement, consider meclizine.   4. Benign prostatic hyperplasia with urinary frequency Controlled. Denies hesitancy. Does not want to take med if possible. monitor  5. Mixed hyperlipidemia LDL at goal. Not on statin. Monitor clinically. Continue aspirin   Labs/tests ordered:  A1c, bmp  Next appt:  3 months for annual exam, MMSE, diabetic foot exam   I spent 30 minutes in total face-to-face time with the patient, more than 50% of which was spent in counseling and coordination of care, reviewing test results, reviewing medication and discussing or reviewing the diagnosis with patient.     Blanchie Serve, MD Internal Medicine [facility] [address] [place], [place] [address] Cell Phone (Monday-Friday 8 am - 5 pm): [phone] On Call: [phone] and follow prompts after 5 pm and on weekends Office Phone: [phone] Office Fax: [phone]

## 2017-08-26 ENCOUNTER — Non-Acute Institutional Stay: Payer: Medicare Other

## 2017-08-26 VITALS — BP 118/58 | HR 96 | Temp 98.0°F | Ht 65.0 in | Wt 155.0 lb

## 2017-08-26 DIAGNOSIS — Z0000 Encounter for general adult medical examination without abnormal findings: Secondary | ICD-10-CM | POA: Diagnosis not present

## 2017-08-26 MED ORDER — PNEUMOCOCCAL 13-VAL CONJ VACC IM SUSP: 0.5000 mL | INTRAMUSCULAR | 0 refills | Status: AC

## 2017-08-26 NOTE — Progress Notes (Signed)
Subjective:   George [name] is a 83 y.o. male who presents for an Initial Medicare Annual Wellness Visit at [facility]    Objective:    Today's Vitals   08/26/17 0857  BP: (!) 118/58  Pulse: 96  Temp: 98 F (36.7 C)  TempSrc: Oral  SpO2: 96%  Weight: 155 lb (70.3 kg)  Height: 5\' 5"  (1.651 m)   Body mass index is 25.79 kg/m.  Current Medications (verified) Outpatient Encounter Prescriptions as of 08/26/2017  Medication Sig  . aspirin 81 MG tablet Take 81 mg by mouth daily.  Marland Kitchen JANUVIA 100 MG tablet TAKE 1 TABLET BY MOUTH EVERY MORNING TO CONTROL DIABETES  . pioglitazone (ACTOS) 45 MG tablet TAKE 1 TABLET BY MOUTH EVERY DAY TO CONTROL BLOOD SUGAR  . Selenium 200 MCG TABS Take by mouth. Take one tablet daily  . glucose blood test strip Use one strip to test blood sugar once a day before breakfast . Dx E11.9 (Patient not taking: Reported on 07/07/2017)  . MICROLET LANCETS MISC Use one lancet each morning to test blood sugar. Dx. E11.9 (Patient not taking: Reported on 07/07/2017)   No facility-administered encounter medications on file as of 08/26/2017.     Allergies (verified) Metformin and related; Sulfa antibiotics; Tetanus toxoids; and Typhoid vaccines   History: Past Medical History:  Diagnosis Date  . Abdominal pain, other specified site   . Allergic rhinitis due to pollen   . Benign paroxysmal positional vertigo   . Disturbance of skin sensation    left great toe  . Elevated prostate specific antigen (PSA)   . Hematuria, unspecified   . Hypertrophy of prostate without urinary obstruction and other lower urinary tract symptoms (LUTS)   . Impotence of organic origin   . Left knee pain 05/28/2014  . Macrocytosis   . Other and unspecified hyperlipidemia   . Other malaise and fatigue   . Spermatocele    bilateral  . Type II or unspecified type diabetes mellitus without mention of complication, uncontrolled   . Unspecified essential hypertension   .  Unspecified glaucoma(365.9)    Past Surgical History:  Procedure Laterality Date  . CATARACT EXTRACTION EXTRACAPSULAR  2008   bilateraly Dr. Katy Fitch  . COLONOSCOPY  01/03/2002   normal Dr. Earlean Shawl  . PROSTATE BIOPSY  1995   due to elevated PSA normal  . TONSILLECTOMY     Family History  Problem Relation Age of Onset  . Cancer Father        lung  . Cancer Brother        adrenal gland  . Cancer Son        liver   Social History   Occupational History  . retired, self employed    Social History Main Topics  . Smoking status: Former Smoker    Years: 4.00    Quit date: 04/09/1948  . Smokeless tobacco: Never Used  . Alcohol use No  . Drug use: No  . Sexual activity: Yes   Tobacco Counseling Counseling given: Not Answered   Activities of Daily Living In your present state of health, do you have any difficulty performing the following activities: 08/26/2017  Hearing? N  Vision? N  Difficulty concentrating or making decisions? N  Walking or climbing stairs? N  Dressing or bathing? N  Doing errands, shopping? N  Preparing Food and eating ? N  Using the Toilet? N  In the past six months, have you accidently leaked urine?  N  Comment urinary frequency  Do you have problems with loss of bowel control? N  Managing your Medications? N  Managing your Finances? N  Housekeeping or managing your Housekeeping? N  Some recent data might be hidden    Immunizations and Health Maintenance Immunization History  Administered Date(s) Administered  . Influenza Whole 09/19/2012, 09/20/2013  . Influenza-Unspecified 10/03/2014, 09/18/2015, 09/30/2016  . Pneumococcal Polysaccharide-23 10/24/2001   There are no preventive care reminders to display for this patient.  Patient Care Team: Blanchie Serve, MD as PCP - General (Internal Medicine) Clent Jacks, MD as Consulting Physician (Ophthalmology) Lindwood Coke, MD as Consulting Physician (Dermatology) Thornell Sartorius, MD as  Consulting Physician (Otolaryngology) Richmond Campbell, MD as Consulting Physician (Gastroenterology) [place] any recent Medical Services you may have received from other than [facility] providers in the past year (date may be approximate).    Assessment:   This is a routine wellness examination for [name].   Hearing/Vision screen No exam data present  Dietary issues and exercise activities discussed: Current Exercise Habits: Home exercise routine, Type of exercise: walking, Time (Minutes): 30, Frequency (Times/Week): 7, Weekly Exercise (Minutes/Week): 210, Intensity: Mild, Exercise limited by: None identified  Goals    . Check Blood sugar          Patient will check blood sugars      Depression Screen PHQ 2/9 Scores 08/26/2017 07/27/2016 01/27/2016 07/22/2015  PHQ - 2 Score 0 0 0 0    Fall Risk Fall Risk  08/26/2017 07/27/2016 01/27/2016 07/22/2015 01/28/2015  Falls in the past year? No No No No No    Cognitive Function: MMSE - Mini Mental State Exam 08/26/2017  Orientation to time 3  Orientation to Place 5  Registration 3  Attention/ Calculation 0  Recall 0  Language- name 2 objects 2  Language- repeat 1  Language- follow 3 step command 3  Language- read & follow direction 1  Write a sentence 1  Copy design 1  Total score 20        Screening Tests Health Maintenance  Topic Date Due  . INFLUENZA VACCINE  09/19/2017 (Originally 07/20/2017)  . FOOT EXAM  09/19/2017 (Originally 07/27/2017)  . OPHTHALMOLOGY EXAM  12/20/2017 (Originally 12/31/2016)  . TETANUS/TDAP  12/20/2017 (Originally 10/07/1943)  . PNA vac Low Risk Adult (2 of 2 - PCV13) 12/20/2017 (Originally 10/24/2002)  . HEMOGLOBIN A1C  01/25/2018  . URINE MICROALBUMIN  07/25/2018        Plan:  I have personally reviewed and addressed the Medicare Annual Wellness questionnaire and have noted the following in the patient's chart:  A. Medical and social history B. Use of alcohol, tobacco or illicit drugs   C. Current medications and supplements D. Functional ability and status E.  Nutritional status F.  Physical activity G. Advance directives H. List of other physicians I.  Hospitalizations, surgeries, and ER visits in previous 12 months J.  [place] to include hearing, vision, cognitive, depression L. Referrals and appointments - none  In addition, I have reviewed and discussed with patient certain preventive protocols, quality metrics, and best practice recommendations. A written personalized care plan for preventive services as well as general preventive health recommendations were provided to patient.  See attached scanned questionnaire for additional information.   Signed,   Rich Reining, RN Nurse Health Advisor   Quick Notes   Health Maintenance: Foot exam, PNA 13 due. Pt will receive flu vaccine when it is in stock.  TDAP and shingles declined.     Abnormal Screen: MMSE 20/30. Passed clock drawing.     Patient Concerns: None     Nurse Concerns: Lightheadedness after eating, recommended patient starts checking blood sugars.

## 2017-08-26 NOTE — Patient Instructions (Addendum)
George [name] , Thank you for taking time to come for your Medicare Wellness Visit. I appreciate your ongoing commitment to your health goals. Please review the following plan we discussed and let me know if I can assist you in the future.   Screening recommendations/referrals: Colonoscopy excluded, you are over age 81 Recommended yearly ophthalmology/optometry visit for glaucoma screening and checkup Recommended yearly dental visit for hygiene and checkup  Vaccinations: Influenza vaccine due Pneumococcal vaccine 13 due. I have sent a prescription to your pharmacy Tdap vaccine due, declined Shingles vaccine declined.  Advanced directives: In Chart  Conditions/risks identified: Lightheadedness after eating. Start checking blood sugars 1-2 hours after eating your meal. It should be below 180. If you check before your meal it should be between 70-130. Keep a records of sugars and bring to next appointment.  Next appointment: Dr. Bubba Camp 11/09/17 @ 9am  Preventive Care 81 Years and Older, Male Preventive care refers to lifestyle choices and visits with your health care provider that can promote health and wellness. What does preventive care include?  A yearly physical exam. This is also called an annual well check.  Dental exams once or twice a year.  Routine eye exams. Ask your health care provider how often you should have your eyes checked.  Personal lifestyle choices, including:  Daily care of your teeth and gums.  Regular physical activity.  Eating a healthy diet.  Avoiding tobacco and drug use.  Limiting alcohol use.  Practicing safe sex.  Taking low doses of aspirin every day.  Taking vitamin and mineral supplements as recommended by your health care provider. What happens during an annual well check? The services and screenings done by your health care provider during your annual well check will depend on your age, overall health, lifestyle risk factors, and family  history of disease. Counseling  Your health care provider may ask you questions about your:  Alcohol use.  Tobacco use.  Drug use.  Emotional well-being.  Home and relationship well-being.  Sexual activity.  Eating habits.  History of falls.  Memory and ability to understand (cognition).  Work and work Statistician. Screening  You may have the following tests or measurements:  Height, weight, and BMI.  Blood pressure.  Lipid and cholesterol levels. These may be checked every 5 years, or more frequently if you are over 26 years old.  Skin check.  Lung cancer screening. You may have this screening every year starting at age 6 if you have a 30-pack-year history of smoking and currently smoke or have quit within the past 15 years.  Fecal occult blood test (FOBT) of the stool. You may have this test every year starting at age 23.  Flexible sigmoidoscopy or colonoscopy. You may have a sigmoidoscopy every 5 years or a colonoscopy every 10 years starting at age 28.  Prostate cancer screening. Recommendations will vary depending on your family history and other risks.  Hepatitis C blood test.  Hepatitis B blood test.  Sexually transmitted disease (STD) testing.  Diabetes screening. This is done by checking your blood sugar (glucose) after you have not eaten for a while (fasting). You may have this done every 1-3 years.  Abdominal aortic aneurysm (AAA) screening. You may need this if you are a current or former smoker.  Osteoporosis. You may be screened starting at age 60 if you are at high risk. Talk with your health care provider about your test results, treatment options, and if necessary, the need for more tests.  Vaccines  Your health care provider may recommend certain vaccines, such as:  Influenza vaccine. This is recommended every year.  Tetanus, diphtheria, and acellular pertussis (Tdap, Td) vaccine. You may need a Td booster every 10 years.  Zoster vaccine.  You may need this after age 22.  Pneumococcal 13-valent conjugate (PCV13) vaccine. One dose is recommended after age 3.  Pneumococcal polysaccharide (PPSV23) vaccine. One dose is recommended after age 80. Talk to your health care provider about which screenings and vaccines you need and how often you need them. This information is not intended to replace advice given to you by your health care provider. Make sure you discuss any questions you have with your health care provider. Document Released: 01/02/2016 Document Revised: 08/25/2016 Document Reviewed: 10/07/2015 Elsevier Interactive Patient Education  2017 [place] Prevention in the Home Falls can cause injuries. They can happen to people of all ages. There are many things you can do to make your home safe and to help prevent falls. What can I do on the outside of my home?  Regularly fix the edges of walkways and driveways and fix any cracks.  Remove anything that might make you trip as you walk through a door, such as a raised step or threshold.  Trim any bushes or trees on the path to your home.  Use bright outdoor lighting.  Clear any walking paths of anything that might make someone trip, such as rocks or tools.  Regularly check to see if handrails are loose or broken. Make sure that both sides of any steps have handrails.  Any raised decks and porches should have guardrails on the edges.  Have any leaves, snow, or ice cleared regularly.  Use sand or salt on walking paths during winter.  Clean up any spills in your garage right away. This includes oil or grease spills. What can I do in the bathroom?  Use night lights.  Install grab bars by the toilet and in the tub and shower. Do not use towel bars as grab bars.  Use non-skid mats or decals in the tub or shower.  If you need to sit down in the shower, use a plastic, non-slip stool.  Keep the floor dry. Clean up any water that spills on the floor as soon  as it happens.  Remove soap buildup in the tub or shower regularly.  Attach bath mats securely with double-sided non-slip rug tape.  Do not have throw rugs and other things on the floor that can make you trip. What can I do in the bedroom?  Use night lights.  Make sure that you have a light by your bed that is easy to reach.  Do not use any sheets or blankets that are too big for your bed. They should not hang down onto the floor.  Have a firm chair that has side arms. You can use this for support while you get dressed.  Do not have throw rugs and other things on the floor that can make you trip. What can I do in the kitchen?  Clean up any spills right away.  Avoid walking on wet floors.  Keep items that you use a lot in easy-to-reach places.  If you need to reach something above you, use a strong step stool that has a grab bar.  Keep electrical cords out of the way.  Do not use floor polish or wax that makes floors slippery. If you must use wax, use non-skid floor wax.  Do not have throw rugs and other things on the floor that can make you trip. What can I do with my stairs?  Do not leave any items on the stairs.  Make sure that there are handrails on both sides of the stairs and use them. Fix handrails that are broken or loose. Make sure that handrails are as long as the stairways.  Check any carpeting to make sure that it is firmly attached to the stairs. Fix any carpet that is loose or worn.  Avoid having throw rugs at the top or bottom of the stairs. If you do have throw rugs, attach them to the floor with carpet tape.  Make sure that you have a light switch at the top of the stairs and the bottom of the stairs. If you do not have them, ask someone to add them for you. What else can I do to help prevent falls?  Wear shoes that:  Do not have high heels.  Have rubber bottoms.  Are comfortable and fit you well.  Are closed at the toe. Do not wear sandals.  If  you use a stepladder:  Make sure that it is fully opened. Do not climb a closed stepladder.  Make sure that both sides of the stepladder are locked into place.  Ask someone to hold it for you, if possible.  Clearly mark and make sure that you can see:  Any grab bars or handrails.  First and last steps.  Where the edge of each step is.  Use tools that help you move around (mobility aids) if they are needed. These include:  Canes.  Walkers.  Scooters.  Crutches.  Turn on the lights when you go into a dark area. Replace any light bulbs as soon as they burn out.  Set up your furniture so you have a clear path. Avoid moving your furniture around.  If any of your floors are uneven, fix them.  If there are any pets around you, be aware of where they are.  Review your medicines with your doctor. Some medicines can make you feel dizzy. This can increase your chance of falling. Ask your doctor what other things that you can do to help prevent falls. This information is not intended to replace advice given to you by your health care provider. Make sure you discuss any questions you have with your health care provider. Document Released: 10/02/2009 Document Revised: 05/13/2016 Document Reviewed: 01/10/2015 Elsevier Interactive Patient Education  2017 [employer].

## 2017-08-26 NOTE — Addendum Note (Signed)
Addended by: Rich Reining E on: 08/26/2017 01:19 PM   Modules accepted: Orders, SmartSet

## 2017-08-27 DIAGNOSIS — Z23 Encounter for immunization: Secondary | ICD-10-CM | POA: Diagnosis not present

## 2017-09-28 DIAGNOSIS — Z23 Encounter for immunization: Secondary | ICD-10-CM | POA: Diagnosis not present

## 2017-10-28 DIAGNOSIS — I10 Essential (primary) hypertension: Secondary | ICD-10-CM | POA: Diagnosis not present

## 2017-10-28 DIAGNOSIS — E118 Type 2 diabetes mellitus with unspecified complications: Secondary | ICD-10-CM | POA: Diagnosis not present

## 2017-10-31 LAB — BASIC METABOLIC PANEL WITH GFR
BUN/Creatinine Ratio: 35 (calc) — ABNORMAL HIGH (ref 6–22)
BUN: 24 mg/dL (ref 7–25)
CALCIUM: 8.7 mg/dL (ref 8.6–10.3)
CHLORIDE: 105 mmol/L (ref 98–110)
CO2: 30 mmol/L (ref 20–32)
Creat: 0.69 mg/dL — ABNORMAL LOW (ref 0.70–1.11)
GFR, Est African American: 96 mL/min/{1.73_m2} (ref >=60)
GFR, Est Non African American: 83 mL/min/{1.73_m2} (ref >=60)
GLUCOSE: 120 mg/dL — AB (ref 65–99)
POTASSIUM: 4.1 mmol/L (ref 3.5–5.3)
SODIUM: 141 mmol/L (ref 135–146)

## 2017-10-31 LAB — HEMOGLOBIN A1C
EAG (MMOL/L): 8.9 (calc)
Hgb A1c MFr Bld: 7.2 % of total Hgb — ABNORMAL HIGH (ref <5.7)
MEAN PLASMA GLUCOSE: 160 (calc)

## 2017-11-09 ENCOUNTER — Non-Acute Institutional Stay: Payer: Medicare Other | Admitting: Internal Medicine

## 2017-11-09 ENCOUNTER — Encounter: Payer: Self-pay | Admitting: Internal Medicine

## 2017-11-09 VITALS — BP 112/62 | HR 77 | Temp 97.6°F | Resp 18 | Ht 65.0 in | Wt 156.2 lb

## 2017-11-09 DIAGNOSIS — E782 Mixed hyperlipidemia: Secondary | ICD-10-CM

## 2017-11-09 DIAGNOSIS — N401 Enlarged prostate with lower urinary tract symptoms: Secondary | ICD-10-CM

## 2017-11-09 DIAGNOSIS — I10 Essential (primary) hypertension: Secondary | ICD-10-CM | POA: Diagnosis not present

## 2017-11-09 DIAGNOSIS — R413 Other amnesia: Secondary | ICD-10-CM

## 2017-11-09 DIAGNOSIS — R350 Frequency of micturition: Secondary | ICD-10-CM | POA: Diagnosis not present

## 2017-11-09 DIAGNOSIS — E118 Type 2 diabetes mellitus with unspecified complications: Secondary | ICD-10-CM

## 2017-11-09 DIAGNOSIS — H8113 Benign paroxysmal vertigo, bilateral: Secondary | ICD-10-CM

## 2017-11-09 MED ORDER — VITAMIN B-12 1000 MCG PO TABS: 1000.0000 ug | ORAL_TABLET | Freq: Every day | ORAL | 3 refills | Status: DC

## 2017-11-09 NOTE — Progress Notes (Signed)
[facility]  Provider: Blanchie Serve MD   Location:  [place] of Service:  Clinic (12)  PCP: Blanchie Serve, MD Patient Care Team: Blanchie Serve, MD as PCP - General (Internal Medicine) Clent Jacks, MD as Consulting Physician (Ophthalmology) Lindwood Coke, MD as Consulting Physician (Dermatology) Thornell Sartorius, MD as Consulting Physician (Otolaryngology) Richmond Campbell, MD as Consulting Physician (Gastroenterology) Melina Modena, [facility]  Extended Emergency Contact Information Primary Emergency Contact: Hunger,Peggy Address: [id number]          [place] [address] [place] Phone: [phone] Relation: Spouse   Goals of Care: Advanced Directive information Advanced Directives 08/26/2017  Does Patient Have a Medical Advance Directive? Yes  Type of Paramedic of [place];Living will  Does patient want to make changes to medical advance directive? No - Patient declined  Copy of [place] in Chart? Yes  Would patient like information on creating a medical advance directive? -     Chief Complaint  Patient presents with  . Medical Management of Chronic Issues    3 month follow up. No concerns at this time.   . Medication Refill    No refills needed at this time     HPI: Patient is a 83 y.o. male seen today for routine visit.   Type 2 DM- have not been checking blood glucose at home. Currently on januvia 100 mg daily and pioglitazone 45 mg daily. No hypoglycemia symptom reported. Denies callus, corn or sores to his feet. No fall reported. Negative for microalbuminuria.   HTN- takes baby aspirin a day. Not on any antihypertensives.   uptodate with influenza and pneumococcal vaccine  BPV- controlled. Slow position change helps.   BPH- symptom free.  Hyperlipidemia- not on statin. Currently on aspirin.   Immunization History  Administered Date(s)  Administered  . Influenza Whole 09/19/2012, 09/20/2013  . Influenza, High Dose Seasonal PF 09/28/2017  . Influenza-Unspecified 10/03/2014, 09/18/2015, 09/30/2016  . Pneumococcal Conjugate-13 08/27/2017  . Pneumococcal Polysaccharide-23 10/24/2001     Past Medical History:  Diagnosis Date  . Abdominal pain, other specified site   . Allergic rhinitis due to pollen   . Benign paroxysmal positional vertigo   . Disturbance of skin sensation    left great toe  . Elevated prostate specific antigen (PSA)   . Hematuria, unspecified   . Hypertrophy of prostate without urinary obstruction and other lower urinary tract symptoms (LUTS)   . Impotence of organic origin   . Left knee pain 05/28/2014  . Macrocytosis   . Other and unspecified hyperlipidemia   . Other malaise and fatigue   . Spermatocele    bilateral  . Type II or unspecified type diabetes mellitus without mention of complication, uncontrolled   . Unspecified essential hypertension   . Unspecified glaucoma(365.9)    Past Surgical History:  Procedure Laterality Date  . CATARACT EXTRACTION EXTRACAPSULAR  2008   bilateraly Dr. Katy Fitch  . COLONOSCOPY  01/03/2002   normal Dr. Earlean Shawl  . PROSTATE BIOPSY  1995   due to elevated PSA normal  . TONSILLECTOMY      reports that he quit smoking about 69 years ago. He quit after 4.00 years of use. he has never used smokeless tobacco. He reports that he does not drink alcohol or use drugs. Social History   Socioeconomic History  . Marital status: Married    Spouse name: Not on file  .  Number of children: Not on file  . Years of education: Not on file  . Highest education level: Not on file  Social Needs  . Financial resource strain: Not on file  . Food insecurity - worry: Not on file  . Food insecurity - inability: Not on file  . Transportation needs - medical: Not on file  . Transportation needs - non-medical: Not on file  Occupational History  . Occupation: retired, self employed    Tobacco Use  . Smoking status: Former Smoker    Years: 4.00    Last attempt to quit: 04/09/1948    Years since quitting: 69.6  . Smokeless tobacco: Never Used  Substance and Sexual Activity  . Alcohol use: No  . Drug use: No  . Sexual activity: Yes  Other Topics Concern  . Not on file  Social History Narrative   Lives at [facility]    Married    Living Will    Functional Status Survey:    Family History  Problem Relation Age of Onset  . Cancer Father        lung  . Cancer Brother        adrenal gland  . Cancer Son        liver    Health Maintenance  Topic Date Due  . FOOT EXAM  07/27/2017  . OPHTHALMOLOGY EXAM  12/20/2017 (Originally 12/31/2016)  . TETANUS/TDAP  12/20/2017 (Originally 10/07/1943)  . PNA vac Low Risk Adult (2 of 2 - PCV13) 12/20/2017 (Originally 10/24/2002)  . HEMOGLOBIN A1C  04/27/2018  . URINE MICROALBUMIN  07/25/2018  . INFLUENZA VACCINE  Completed    Allergies  Allergen Reactions  . Metformin And Related   . Sulfa Antibiotics   . Tetanus Toxoids   . Typhoid Vaccines     Outpatient Encounter Medications as of 11/09/2017  Medication Sig  . aspirin 81 MG tablet Take 81 mg by mouth daily.  Marland Kitchen JANUVIA 100 MG tablet TAKE 1 TABLET BY MOUTH EVERY MORNING TO CONTROL DIABETES  . pioglitazone (ACTOS) 45 MG tablet TAKE 1 TABLET BY MOUTH EVERY DAY TO CONTROL BLOOD SUGAR  . glucose blood test strip Use one strip to test blood sugar once a day before breakfast . Dx E11.9 (Patient not taking: Reported on 07/07/2017)  . MICROLET LANCETS MISC Use one lancet each morning to test blood sugar. Dx. E11.9 (Patient not taking: Reported on 07/07/2017)  . Selenium 200 MCG TABS Take by mouth. Take one tablet daily   No facility-administered encounter medications on file as of 11/09/2017.     Review of Systems  Constitutional: Negative for appetite change, chills, diaphoresis, fatigue and fever.  HENT: Positive for hearing loss. Negative for congestion,  mouth sores, rhinorrhea, sore throat and trouble swallowing.        Has hearing aid but does not wear them  Eyes:       Wears corrective glasses, sees his eye doctor once a year  Respiratory: Negative for cough, shortness of breath and wheezing.   Cardiovascular: Negative for chest pain, palpitations and leg swelling.  Gastrointestinal: Negative for abdominal pain, blood in stool, constipation, diarrhea, nausea and vomiting.  Genitourinary: Negative for dysuria and hematuria.       Gets up once at night to urinate  Musculoskeletal: Negative for arthralgias, back pain and gait problem.       No fall reported  Skin: Negative for rash and wound.  Neurological: Negative for dizziness, weakness and headaches.  Psychiatric/Behavioral:  Negative for behavioral problems, confusion, sleep disturbance and suicidal ideas.    Vitals:   11/09/17 0842  BP: 112/62  Pulse: 77  Resp: 18  Temp: 97.6 F (36.4 C)  TempSrc: Oral  SpO2: 92%  Weight: 156 lb 3.2 oz (70.9 kg)  Height: '5\' 5"'$  (1.651 m)   Body mass index is 25.99 kg/m.   Wt Readings from Last 3 Encounters:  11/09/17 156 lb 3.2 oz (70.9 kg)  08/26/17 155 lb (70.3 kg)  08/03/17 155 lb (70.3 kg)   Physical Exam  Constitutional: He is oriented to person, place, and time. He appears well-developed and well-nourished. No distress.  HENT:  Head: Normocephalic and atraumatic.  Nose: Nose normal.  Mouth/Throat: Oropharynx is clear and moist. No oropharyngeal exudate.  Some cerumen to both ears  Eyes: Conjunctivae and EOM are normal. Pupils are equal, round, and reactive to light. Right eye exhibits no discharge. Left eye exhibits no discharge. No scleral icterus.  Neck: Normal range of motion. Neck supple.  Cardiovascular: Normal rate and regular rhythm.  Pulmonary/Chest: Effort normal and breath sounds normal. No respiratory distress. He has no wheezes. He has no rales. He exhibits no tenderness.  Abdominal: Soft. Bowel sounds are normal.  There is no tenderness. There is no guarding.  Musculoskeletal: Normal range of motion. He exhibits deformity. He exhibits no edema or tenderness.  Lymphadenopathy:    He has no cervical adenopathy.  Neurological: He is alert and oriented to person, place, and time.  08/26/17 MMSE 20/30.  Skin: Skin is warm and dry. No rash noted. He is not diaphoretic. No pallor.  Psychiatric: He has a normal mood and affect. His behavior is normal.   Diabetic Foot Exam - Simple   Simple Foot Form Diabetic Foot exam was performed with the following findings:  Yes 11/09/2017  9:29 AM  Visual Inspection No deformities, no ulcerations, no other skin breakdown bilaterally:  Yes Sensation Testing Intact to touch and monofilament testing bilaterally:  Yes Pulse Check Posterior Tibialis and Dorsalis pulse intact bilaterally:  Yes Comments     Labs reviewed: Basic Metabolic Panel: Recent Labs    07/08/17 0807 07/25/17 0800 10/28/17 0000  NA 139 141 141  K 4.2 4.0 4.1  CL 105 104 105  CO2 '26 27 30  '$ GLUCOSE 142* 113* 120*  BUN '19 20 24  '$ CREATININE 0.83 0.76 0.69*  CALCIUM 8.6 8.9 8.7   Liver Function Tests: Recent Labs    02/03/17 0745 07/08/17 0807 07/25/17 0800  AST '15 15 15  '$ ALT 7* 9 8*  ALKPHOS 69 69 70  BILITOT 0.9 1.0 0.8  PROT 6.2 6.1 6.4  ALBUMIN 3.9 3.9 4.1   No results for input(s): LIPASE, AMYLASE in the last 8760 hours. No results for input(s): AMMONIA in the last 8760 hours. CBC: Recent Labs    07/08/17 0807 07/25/17 0800  WBC 5.5 6.7  NEUTROABS 3,190 4,154  HGB 13.0* 13.5  HCT 40.2 40.5  MCV 100.8* 100.0  PLT 311 292   Cardiac Enzymes: No results for input(s): CKTOTAL, CKMB, CKMBINDEX, TROPONINI in the last 8760 hours. BNP: Invalid input(s): POCBNP Lab Results  Component Value Date   HGBA1C 7.2 (H) 10/28/2017   No results found for: TSH Lab Results  Component Value Date   VITAMINB12 289 02/03/2017   No results found for: FOLATE No results found for:  IRON, TIBC, FERRITIN  Lipid Panel: Recent Labs    07/25/17 0800  CHOL 126  HDL 59  LDLCALC  51  TRIG 78  CHOLHDL 2.1   Lab Results  Component Value Date   HGBA1C 7.2 (H) 10/28/2017    Procedures since last visit: No results found.  Assessment/Plan  1. Essential hypertension Controlled BP. Not on any antihypertensives. Continue baby aspirin.  - CMP with eGFR; Future - Lipid Panel; Future  2. Type 2 diabetes mellitus with complication, without long-term current use of insulin ([place]) Continue actos and [place]. a1c s/o controled DM. Negative for microalbuminuria. Normal foot exam. uptodate with eye exam. LDL at goal. Continue aspirin - CMP with eGFR; Future - CBC (no diff); Future - Hemoglobin A1c; Future  3. Benign paroxysmal positional vertigo due to bilateral vestibular disorder Controlled with slow positional changes.   4. Benign prostatic hyperplasia with urinary frequency Symptom controlled. monitor  5. Mixed hyperlipidemia LDL controlled. Not on statin.  - Lipid Panel; Future  6. Memory loss Borderline low b12, start b12 supplement. Check TSH. Likely has component of vascular dementia with history of HTN, HLD and DM. Continue aspirin. LDL at goal. No behavioral changes. Supportive care for now. Will need to review goals of care with his daughter/HCPOA before deciding on further workup.  - vitamin B-12 (CYANOCOBALAMIN) 1000 MCG tablet; Take 1 tablet (1,000 mcg total) by mouth daily.  Dispense: 90 tablet; Refill: 3 - TSH; Future   Labs/tests ordered:   Lab Orders     CMP with eGFR     Lipid Panel     CBC (no diff)     Hemoglobin A1c     TSH Next appointment: 3 months for physical with EKG, MMSE  Communication: reviewed care plan with patient and his son in law.    Blanchie Serve, MD Internal Medicine [facility] [address] [place], [place] [address] Cell Phone (Monday-Friday 8 am - 5 pm): [phone] On Call:  [phone] and follow prompts after 5 pm and on weekends Office Phone: [phone] Office Fax: [phone]

## 2017-11-30 DIAGNOSIS — H10023 Other mucopurulent conjunctivitis, bilateral: Secondary | ICD-10-CM | POA: Diagnosis not present

## 2017-12-12 DIAGNOSIS — H01025 Squamous blepharitis left lower eyelid: Secondary | ICD-10-CM | POA: Diagnosis not present

## 2017-12-12 DIAGNOSIS — H01021 Squamous blepharitis right upper eyelid: Secondary | ICD-10-CM | POA: Diagnosis not present

## 2017-12-12 DIAGNOSIS — H01024 Squamous blepharitis left upper eyelid: Secondary | ICD-10-CM | POA: Diagnosis not present

## 2017-12-12 DIAGNOSIS — H11123 Conjunctival concretions, bilateral: Secondary | ICD-10-CM | POA: Diagnosis not present

## 2017-12-12 DIAGNOSIS — H01022 Squamous blepharitis right lower eyelid: Secondary | ICD-10-CM | POA: Diagnosis not present

## 2017-12-14 DIAGNOSIS — M7521 Bicipital tendinitis, right shoulder: Secondary | ICD-10-CM | POA: Diagnosis not present

## 2017-12-14 DIAGNOSIS — H8110 Benign paroxysmal vertigo, unspecified ear: Secondary | ICD-10-CM | POA: Diagnosis not present

## 2017-12-14 DIAGNOSIS — R42 Dizziness and giddiness: Secondary | ICD-10-CM | POA: Diagnosis not present

## 2018-01-13 DIAGNOSIS — J209 Acute bronchitis, unspecified: Secondary | ICD-10-CM | POA: Diagnosis not present

## 2018-01-30 DIAGNOSIS — E118 Type 2 diabetes mellitus with unspecified complications: Secondary | ICD-10-CM | POA: Diagnosis not present

## 2018-01-30 DIAGNOSIS — E782 Mixed hyperlipidemia: Secondary | ICD-10-CM | POA: Diagnosis not present

## 2018-01-30 DIAGNOSIS — I10 Essential (primary) hypertension: Secondary | ICD-10-CM | POA: Diagnosis not present

## 2018-01-30 DIAGNOSIS — R413 Other amnesia: Secondary | ICD-10-CM | POA: Diagnosis not present

## 2018-01-31 LAB — LIPID PANEL
CHOLESTEROL: 132 mg/dL (ref <200)
HDL: 54 mg/dL (ref >40)
LDL CHOLESTEROL (CALC): 59 mg/dL
Non-HDL Cholesterol (Calc): 78 mg/dL (calc) (ref <130)
Total CHOL/HDL Ratio: 2.4 (calc) (ref <5.0)
Triglycerides: 106 mg/dL (ref <150)

## 2018-01-31 LAB — CBC
HCT: 37.3 % — ABNORMAL LOW (ref 38.5–50.0)
Hemoglobin: 13 g/dL — ABNORMAL LOW (ref 13.2–17.1)
MCH: 33.9 pg — AB (ref 27.0–33.0)
MCHC: 34.9 g/dL (ref 32.0–36.0)
MCV: 97.4 fL (ref 80.0–100.0)
MPV: 9.1 fL (ref 7.5–12.5)
PLATELETS: 333 10*3/uL (ref 140–400)
RBC: 3.83 10*6/uL — AB (ref 4.20–5.80)
RDW: 16.3 % — AB (ref 11.0–15.0)
WBC: 6.7 10*3/uL (ref 3.8–10.8)

## 2018-01-31 LAB — TSH: TSH: 2.67 m[IU]/L (ref 0.40–4.50)

## 2018-01-31 LAB — COMPLETE METABOLIC PANEL WITH GFR
AG Ratio: 1.8 (calc) (ref 1.0–2.5)
ALT: 7 U/L — ABNORMAL LOW (ref 9–46)
AST: 15 U/L (ref 10–35)
Albumin: 4 g/dL (ref 3.6–5.1)
Alkaline phosphatase (APISO): 74 U/L (ref 40–115)
BUN: 20 mg/dL (ref 7–25)
CALCIUM: 8.6 mg/dL (ref 8.6–10.3)
CO2: 28 mmol/L (ref 20–32)
CREATININE: 0.71 mg/dL (ref 0.70–1.11)
Chloride: 105 mmol/L (ref 98–110)
GFR, EST AFRICAN AMERICAN: 95 mL/min/{1.73_m2} (ref >=60)
GFR, EST NON AFRICAN AMERICAN: 82 mL/min/{1.73_m2} (ref >=60)
GLOBULIN: 2.2 g/dL (ref 1.9–3.7)
GLUCOSE: 135 mg/dL — AB (ref 65–99)
Potassium: 4.4 mmol/L (ref 3.5–5.3)
SODIUM: 141 mmol/L (ref 135–146)
TOTAL PROTEIN: 6.2 g/dL (ref 6.1–8.1)
Total Bilirubin: 0.7 mg/dL (ref 0.2–1.2)

## 2018-01-31 LAB — HEMOGLOBIN A1C
EAG (MMOL/L): 9 (calc)
Hgb A1c MFr Bld: 7.3 % of total Hgb — ABNORMAL HIGH (ref <5.7)
MEAN PLASMA GLUCOSE: 163 (calc)

## 2018-02-03 ENCOUNTER — Telehealth: Payer: Self-pay | Admitting: Family

## 2018-02-03 NOTE — Telephone Encounter (Addendum)
Phone call received from Ritta Slot Nurse states patient was downstairs couldn't walk back to his apartment.He complained of dizziness and weakness.VSS and CBG 200.Patient reported to Nurse that he has had same symptoms in the past and Meclizine was ordered but has not taken medication since December 2018.Nurse states has encouraged patient to take medication as directed.Then send to ER for evaluation if symptoms not relieved or worsen.

## 2018-02-08 ENCOUNTER — Encounter: Payer: Self-pay | Admitting: Internal Medicine

## 2018-02-08 ENCOUNTER — Ambulatory Visit: Payer: Medicare Other | Admitting: Internal Medicine

## 2018-02-08 VITALS — BP 124/60 | HR 78 | Temp 97.6°F | Resp 16 | Ht 65.0 in | Wt 151.6 lb

## 2018-02-08 DIAGNOSIS — Z0000 Encounter for general adult medical examination without abnormal findings: Secondary | ICD-10-CM

## 2018-02-08 DIAGNOSIS — Z7189 Other specified counseling: Secondary | ICD-10-CM | POA: Diagnosis not present

## 2018-02-08 DIAGNOSIS — E118 Type 2 diabetes mellitus with unspecified complications: Secondary | ICD-10-CM

## 2018-02-08 DIAGNOSIS — R42 Dizziness and giddiness: Secondary | ICD-10-CM

## 2018-02-08 DIAGNOSIS — E782 Mixed hyperlipidemia: Secondary | ICD-10-CM

## 2018-02-08 MED ORDER — MECLIZINE HCL 25 MG PO TABS: 25.0000 mg | ORAL_TABLET | Freq: Two times a day (BID) | ORAL | 3 refills | Status: DC

## 2018-02-08 MED ORDER — MICROLET LANCETS MISC: 2 refills | Status: DC

## 2018-02-08 MED ORDER — GLUCOSE BLOOD VI STRP: ORAL_STRIP | 2 refills | Status: DC

## 2018-02-08 NOTE — Patient Instructions (Addendum)
Please bring your living will and health care power of attorney paperwork to the clinic.   Check blood pressure at least 3 days a week, bring reading on next visit.

## 2018-02-08 NOTE — Progress Notes (Signed)
[facility] [name]  Provider: Blanchie Serve MD   Location:      Place of Service:     PCP: Blanchie Serve, MD Patient Care Team: Blanchie Serve, MD as PCP - General (Internal Medicine) Clent Jacks, MD as Consulting Physician (Ophthalmology) Lindwood Coke, MD as Consulting Physician (Dermatology) Thornell Sartorius, MD as Consulting Physician (Otolaryngology) Richmond Campbell, MD as Consulting Physician (Gastroenterology) Melina Modena, [facility]  Extended Emergency Contact Information Primary Emergency Contact: Santoro,Peggy Address: [id number]          [place] [address] [place] Phone: [phone] Relation: Spouse  Code Status: full code  Goals of Care: Advanced Directive information Advanced Directives 02/08/2018  Does Patient Have a Medical Advance Directive? Yes  Type of Paramedic of [place];Living will  Does patient want to make changes to medical advance directive? -  Copy of [place] in Chart? No - copy requested  Would patient like information on creating a medical advance directive? -     Chief Complaint  Patient presents with  . Annual Exam    annual exam, dizziness  . Medication Refill    No refills needed at this time  . Results    Discuss labs   . MMSE    30/30. Passed clock drawing    HPI: Patient is a 84 y.o. male seen today for annual physical exam. He is here with his wife. He complaints of pink colored urine last night. No further discoloration complained about. He has been dizzy with position change and while lying with turning his head. He has been taking his medications. He has not been checking his blood sugar. Denies any symptom of chest pain, diaphoresis or dyspnea associated with dizziness.   Past Medical History:  Diagnosis Date  . Abdominal pain, other specified site   . Allergic rhinitis due to pollen   . Benign paroxysmal positional vertigo   .  Disturbance of skin sensation    left great toe  . Elevated prostate specific antigen (PSA)   . Hematuria, unspecified   . Hypertrophy of prostate without urinary obstruction and other lower urinary tract symptoms (LUTS)   . Impotence of organic origin   . Left knee pain 05/28/2014  . Macrocytosis   . Other and unspecified hyperlipidemia   . Other malaise and fatigue   . Spermatocele    bilateral  . Type II or unspecified type diabetes mellitus without mention of complication, uncontrolled   . Unspecified essential hypertension   . Unspecified glaucoma(365.9)    Past Surgical History:  Procedure Laterality Date  . CATARACT EXTRACTION EXTRACAPSULAR  2008   bilateraly Dr. Katy Fitch  . COLONOSCOPY  01/03/2002   normal Dr. Earlean Shawl  . PROSTATE BIOPSY  1995   due to elevated PSA normal  . TONSILLECTOMY      reports that he quit smoking about 69 years ago. He quit after 4.00 years of use. he has never used smokeless tobacco. He reports that he does not drink alcohol or use drugs. Social History   Socioeconomic History  . Marital status: Married    Spouse name: Not on file  . Number of children: Not on file  . Years of education: Not on file  . Highest education level: Not on file  Social Needs  . Financial resource strain: Not on file  . Food insecurity - worry: Not on file  . Food insecurity - inability: Not on file  .  Transportation needs - medical: Not on file  . Transportation needs - non-medical: Not on file  Occupational History  . Occupation: retired, self employed  Tobacco Use  . Smoking status: Former Smoker    Years: 4.00    Last attempt to quit: 04/09/1948    Years since quitting: 69.8  . Smokeless tobacco: Never Used  Substance and Sexual Activity  . Alcohol use: No  . Drug use: No  . Sexual activity: Yes  Other Topics Concern  . Not on file  Social History Narrative   [facility] at [facility]    Married    Living Will    Functional Status Survey:     Family History  Problem Relation Age of Onset  . Cancer Father        lung  . Cancer Brother        adrenal gland  . Cancer Son        liver    Health Maintenance  Topic Date Due  . Samul Dada  10/07/1943  . OPHTHALMOLOGY EXAM  12/31/2016  . URINE MICROALBUMIN  07/25/2018  . HEMOGLOBIN A1C  07/30/2018  . FOOT EXAM  11/09/2018  . INFLUENZA VACCINE  Completed  . PNA vac Low Risk Adult  Completed    Allergies  Allergen Reactions  . Metformin And Related   . Sulfa Antibiotics   . Tetanus Toxoids   . Typhoid Vaccines     Outpatient Encounter Medications as of 02/08/2018  Medication Sig  . aspirin 81 MG tablet Take 81 mg by mouth daily.  Marland Kitchen JANUVIA 100 MG tablet TAKE 1 TABLET BY MOUTH EVERY MORNING TO CONTROL DIABETES  . pioglitazone (ACTOS) 45 MG tablet TAKE 1 TABLET BY MOUTH EVERY DAY TO CONTROL BLOOD SUGAR  . Selenium 200 MCG TABS Take by mouth. Take one tablet daily  . vitamin B-12 (CYANOCOBALAMIN) 1000 MCG tablet Take 1 tablet (1,000 mcg total) by mouth daily.  Marland Kitchen glucose blood test strip Use one strip to test blood sugar once a day before breakfast . Dx E11.9  . meclizine (ANTIVERT) 25 MG tablet Take 1 tablet (25 mg total) by mouth 2 (two) times daily.  Marland Kitchen MICROLET LANCETS MISC Use one lancet each morning to test blood sugar. Dx. E11.9  . [DISCONTINUED] glucose blood test strip Use one strip to test blood sugar once a day before breakfast . Dx E11.9 (Patient not taking: Reported on 07/07/2017)  . [DISCONTINUED] MICROLET LANCETS MISC Use one lancet each morning to test blood sugar. Dx. E11.9 (Patient not taking: Reported on 07/07/2017)   No facility-administered encounter medications on file as of 02/08/2018.     Review of Systems  Constitutional: Negative for appetite change, chills, diaphoresis, fatigue and fever.  HENT: Negative for congestion, ear discharge, ear pain, hearing loss, mouth sores, postnasal drip, rhinorrhea, sinus pressure, sinus pain, sore throat,  tinnitus and trouble swallowing.   Eyes: Negative for pain, itching and visual disturbance.  Respiratory: Negative for cough, shortness of breath and wheezing.   Cardiovascular: Negative for chest pain, palpitations and leg swelling.  Gastrointestinal: Negative for abdominal pain, blood in stool, constipation, diarrhea, nausea, rectal pain and vomiting.  Genitourinary: Negative for difficulty urinating, dysuria, flank pain and hematuria.       Had pink color urine yesterday, clear today. No frank blood in urine. Denies dysuria or burning sensation. Denies flank pain. Wakes up once a night to urinate  Musculoskeletal: Negative for arthralgias, back pain and gait problem.  Skin: Negative  for rash and wound.  Neurological: Positive for dizziness. Negative for seizures, syncope, weakness, numbness and headaches.       With sudden change of position, he gets dizzy. 10 days back, he was walking out to parking lot to hand over his care keys to the mechanic and he felt dizzy and had to hold on to the wall and sit down. After resting for some time, he felt better and took help of a friend to get back to his apartment. No further dizziness after that episode. He does feel lightheaded at times.   Hematological: Does not bruise/bleed easily.  Psychiatric/Behavioral: Negative for behavioral problems, confusion, decreased concentration, hallucinations and sleep disturbance. The patient is not nervous/anxious.     Vitals:   02/08/18 1018  BP: 124/60  Pulse: 78  Resp: 16  Temp: 97.6 F (36.4 C)  TempSrc: Oral  SpO2: 96%  Weight: 151 lb 9.6 oz (68.8 kg)  Height: 5\' 5"  (1.651 m)   Body mass index is 25.23 kg/m.   Wt Readings from Last 3 Encounters:  02/08/18 151 lb 9.6 oz (68.8 kg)  11/09/17 156 lb 3.2 oz (70.9 kg)  08/26/17 155 lb (70.3 kg)   Physical Exam  Constitutional: He is oriented to person, place, and time. He appears well-developed and well-nourished. No distress.  HENT:  Head:  Normocephalic and atraumatic.  Right Ear: External ear normal.  Left Ear: External ear normal.  Nose: Nose normal.  Mouth/Throat: Oropharynx is clear and moist. No oropharyngeal exudate.  Eyes: Conjunctivae and EOM are normal. Pupils are equal, round, and reactive to light. Right eye exhibits no discharge. Left eye exhibits no discharge. No scleral icterus.  Has corrective glasses  Neck: Normal range of motion. Neck supple. No thyromegaly present.  Cardiovascular: Normal rate, regular rhythm and intact distal pulses.  Pulmonary/Chest: Effort normal and breath sounds normal. No respiratory distress. He has no wheezes. He has no rales. He exhibits no tenderness.  Abdominal: Soft. Bowel sounds are normal. There is no tenderness. There is no guarding.  Musculoskeletal: Normal range of motion. He exhibits no edema or deformity.  Varicose veins  Lymphadenopathy:    He has no cervical adenopathy.  Neurological: He is alert and oriented to person, place, and time. He displays normal reflexes. No cranial nerve deficit. He exhibits normal muscle tone. Coordination normal.  02/08/18 MMSE 30/30, passed clock draw.  Skin: Skin is warm and dry. No rash noted. He is not diaphoretic. No erythema.  Psychiatric: He has a normal mood and affect. His behavior is normal. Thought content normal.   Diabetic Foot Exam - Simple   Simple Foot Form Visual Inspection No deformities, no ulcerations, no other skin breakdown bilaterally:  Yes Sensation Testing Intact to touch and monofilament testing bilaterally:  Yes Pulse Check Posterior Tibialis and Dorsalis pulse intact bilaterally:  Yes Comments    Labs reviewed: Basic Metabolic Panel: Recent Labs    07/25/17 0800 10/28/17 0000 01/30/18 0000  NA 141 141 141  K 4.0 4.1 4.4  CL 104 105 105  CO2 27 30 28   GLUCOSE 113* 120* 135*  BUN 20 24 20   CREATININE 0.76 0.69* 0.71  CALCIUM 8.9 8.7 8.6   Liver Function Tests: Recent Labs    07/08/17 0807  07/25/17 0800 01/30/18 0000  AST 15 15 15   ALT 9 8* 7*  ALKPHOS 69 70  --   BILITOT 1.0 0.8 0.7  PROT 6.1 6.4 6.2  ALBUMIN 3.9 4.1  --  No results for input(s): LIPASE, AMYLASE in the last 8760 hours. No results for input(s): AMMONIA in the last 8760 hours. CBC: Recent Labs    07/08/17 0807 07/25/17 0800 01/30/18 0000  WBC 5.5 6.7 6.7  NEUTROABS 3,190 4,154  --   HGB 13.0* 13.5 13.0*  HCT 40.2 40.5 37.3*  MCV 100.8* 100.0 97.4  PLT 311 292 333   Cardiac Enzymes: No results for input(s): CKTOTAL, CKMB, CKMBINDEX, TROPONINI in the last 8760 hours. BNP: Invalid input(s): POCBNP Lab Results  Component Value Date   HGBA1C 7.3 (H) 01/30/2018   Lab Results  Component Value Date   TSH 2.67 01/30/2018   Lab Results  Component Value Date   VITAMINB12 289 02/03/2017   No results found for: FOLATE No results found for: IRON, TIBC, FERRITIN  Lipid Panel: Recent Labs    07/25/17 0800 01/30/18 0000  CHOL 126 132  HDL 59 54  LDLCALC 51  --   TRIG 78 106  CHOLHDL 2.1 2.4   Lab Results  Component Value Date   HGBA1C 7.3 (H) 01/30/2018    Procedures since last visit: No results found.  Assessment/Plan  1. Vertigo Sow position change and hydration encouraged. - meclizine (ANTIVERT) 25 MG tablet; Take 1 tablet (25 mg total) by mouth 2 (two) times daily.  Dispense: 90 tablet; Refill: 3  2. Goals of care, counseling/discussion Patient here with his wife. He has a living will and health care power of attorney and copy requested for our records. Filled out a MOST form today. Patient would like to remain full code and want CPR attempted in case of absence of pulse or breathing. He would want full scope of intervention if needed and would like iv fluids and feeding tube if indicated for defined trial period. He would like antibiotic for a trial period as well if indicated. He has reviewed and signed his form. I spent time from 10:45 am to 11:05 am reviewing the form and  options.   3. Encounter for annual physical exam Immunization History  Administered Date(s) Administered  . Influenza Whole 09/19/2012, 09/20/2013  . Influenza, High Dose Seasonal PF 09/28/2017  . Influenza-Unspecified 10/03/2014, 09/18/2015, 09/30/2016  . Pneumococcal Conjugate-13 08/27/2017  . Pneumococcal Polysaccharide-23 10/24/2001   Advised on foot care, keeping skin moisturized, counselled on cut down on sweets and fried food, sunscreen when outdoors, immunization reviewed, lab results reviewed. uptodate with MMSE, PHQ 2 screening. No falls reported.   Depression screen [facility] 2/9 08/26/2017 07/27/2016 01/27/2016 07/22/2015 01/28/2015  Decreased Interest 0 0 0 0 0  Down, Depressed, Hopeless 0 0 0 0 0  PHQ - 2 Score 0 0 0 0 0    4. Type 2 diabetes mellitus with complication, without long-term current use of insulin (HCC) Reviewed a1c. Continue pioglitazone 45 mg daily and januvia 100 mg daily. Advised to check blood sugar atleast 3 times a week for now until next visit. Normal foot exam. Dietary counselling provided. Continue aspirin. LDL at goal. Check urine for microalbumin  5. Mixed hyperlipidemia Goals of care discussioreviewed lipid panel, LDL at goal.  Labs/tests ordered:  A1c, urine microalbumin  Next appointment: 3 months routine, 2-3 weeks for dizziness.   Communication: reviewed care plan with patient    Blanchie Serve, MD Internal Medicine [place], [place] [address] Cell Phone (Monday-Friday 8 am - 5 pm): [phone] On Call: [phone] and follow prompts after 5 pm and on weekends Office Phone:  [phone] Office Fax: [phone]

## 2018-02-09 ENCOUNTER — Non-Acute Institutional Stay: Payer: Medicare Other | Admitting: Family

## 2018-02-09 ENCOUNTER — Encounter (HOSPITAL_COMMUNITY): Payer: Self-pay | Admitting: Emergency Medicine

## 2018-02-09 ENCOUNTER — Telehealth: Payer: Self-pay

## 2018-02-09 ENCOUNTER — Emergency Department (HOSPITAL_COMMUNITY): Payer: Medicare Other

## 2018-02-09 ENCOUNTER — Other Ambulatory Visit: Payer: Self-pay

## 2018-02-09 ENCOUNTER — Encounter: Payer: Self-pay | Admitting: Family

## 2018-02-09 ENCOUNTER — Emergency Department (HOSPITAL_COMMUNITY)
Admission: EM | Admit: 2018-02-09 | Discharge: 2018-02-10 | Disposition: A | Discharge status: Home / Self Care | Payer: Medicare Other | Attending: Emergency Medicine | Admitting: Emergency Medicine

## 2018-02-09 ENCOUNTER — Other Ambulatory Visit: Payer: Self-pay | Admitting: Internal Medicine

## 2018-02-09 VITALS — BP 126/76 | HR 84 | Temp 98.2°F | Resp 16 | Ht 65.0 in | Wt 151.6 lb

## 2018-02-09 DIAGNOSIS — Z79899 Other long term (current) drug therapy: Secondary | ICD-10-CM | POA: Diagnosis not present

## 2018-02-09 DIAGNOSIS — Z87891 Personal history of nicotine dependence: Secondary | ICD-10-CM | POA: Diagnosis not present

## 2018-02-09 DIAGNOSIS — Z7982 Long term (current) use of aspirin: Secondary | ICD-10-CM | POA: Insufficient documentation

## 2018-02-09 DIAGNOSIS — N3001 Acute cystitis with hematuria: Secondary | ICD-10-CM | POA: Diagnosis not present

## 2018-02-09 DIAGNOSIS — R300 Dysuria: Secondary | ICD-10-CM | POA: Insufficient documentation

## 2018-02-09 DIAGNOSIS — Z7984 Long term (current) use of oral hypoglycemic drugs: Secondary | ICD-10-CM | POA: Diagnosis not present

## 2018-02-09 DIAGNOSIS — I10 Essential (primary) hypertension: Secondary | ICD-10-CM | POA: Insufficient documentation

## 2018-02-09 DIAGNOSIS — R42 Dizziness and giddiness: Secondary | ICD-10-CM

## 2018-02-09 DIAGNOSIS — R319 Hematuria, unspecified: Secondary | ICD-10-CM | POA: Diagnosis not present

## 2018-02-09 DIAGNOSIS — E119 Type 2 diabetes mellitus without complications: Secondary | ICD-10-CM | POA: Insufficient documentation

## 2018-02-09 DIAGNOSIS — R310 Gross hematuria: Secondary | ICD-10-CM | POA: Diagnosis not present

## 2018-02-09 LAB — CBC
HCT: 39.1 % (ref 39.0–52.0)
Hemoglobin: 12.9 g/dL — ABNORMAL LOW (ref 13.0–17.0)
MCH: 33.2 pg (ref 26.0–34.0)
MCHC: 33 g/dL (ref 30.0–36.0)
MCV: 100.8 fL — AB (ref 78.0–100.0)
PLATELETS: 287 10*3/uL (ref 150–400)
RBC: 3.88 MIL/uL — AB (ref 4.22–5.81)
RDW: 18 % — AB (ref 11.5–15.5)
WBC: 6.8 10*3/uL (ref 4.0–10.5)

## 2018-02-09 LAB — BASIC METABOLIC PANEL
Anion gap: 8 (ref 5–15)
BUN: 18 mg/dL (ref 6–20)
CALCIUM: 8.8 mg/dL — AB (ref 8.9–10.3)
CHLORIDE: 104 mmol/L (ref 101–111)
CO2: 27 mmol/L (ref 22–32)
CREATININE: 0.65 mg/dL (ref 0.61–1.24)
GFR calc non Af Amer: >60 mL/min (ref >60)
Glucose, Bld: 147 mg/dL — ABNORMAL HIGH (ref 65–99)
Potassium: 4.2 mmol/L (ref 3.5–5.1)
Sodium: 139 mmol/L (ref 135–145)

## 2018-02-09 LAB — URINALYSIS, MICROSCOPIC (REFLEX)
Bacteria, UA: NONE SEEN
SQUAMOUS EPITHELIAL / LPF: NONE SEEN
WBC, UA: NONE SEEN WBC/hpf (ref 0–5)

## 2018-02-09 MED ORDER — IOPAMIDOL (ISOVUE-300) INJECTION 61%
INTRAVENOUS | Status: AC
  Administered 2018-02-09: 100 mL via INTRAVENOUS
  Filled 2018-02-09: qty 100

## 2018-02-09 NOTE — Patient Instructions (Addendum)
Please go to ER for evaluation of blood in the urine.  Hold Asprin for now    Hematuria, Adult Hematuria is blood in your urine. It can be caused by a bladder infection, kidney infection, prostate infection, kidney stone, or cancer of your urinary tract. Infections can usually be treated with medicine, and a kidney stone usually will pass through your urine. If neither of these is the cause of your hematuria, further workup to find out the reason may be needed. It is very important that you tell your health care provider about any blood you see in your urine, even if the blood stops without treatment or happens without causing pain. Blood in your urine that happens and then stops and then happens again can be a symptom of a very serious condition. Also, pain is not a symptom in the initial stages of many urinary cancers. Follow these instructions at home:  Drink lots of fluid, 3-4 quarts a day. If you have been diagnosed with an infection, cranberry juice is especially recommended, in addition to large amounts of water.  Avoid caffeine, tea, and carbonated beverages because they tend to irritate the bladder.  Avoid alcohol because it may irritate the prostate.  Take all medicines as directed by your health care provider.  If you were prescribed an antibiotic medicine, finish it all even if you start to feel better.  If you have been diagnosed with a kidney stone, follow your health care provider's instructions regarding straining your urine to catch the stone.  Empty your bladder often. Avoid holding urine for long periods of time.  After a bowel movement, women should cleanse front to back. Use each tissue only once.  Empty your bladder before and after sexual intercourse if you are a male. Contact a health care provider if:  You develop back pain.  You have a fever.  You have a feeling of sickness in your stomach (nausea) or vomiting.  Your symptoms are not better in 3 days.  Return sooner if you are getting worse. Get help right away if:  You develop severe vomiting and are unable to keep the medicine down.  You develop severe back or abdominal pain despite taking your medicines.  You begin passing a large amount of blood or clots in your urine.  You feel extremely weak or faint, or you pass out. This information is not intended to replace advice given to you by your health care provider. Make sure you discuss any questions you have with your health care provider. Document Released: 12/06/2005 Document Revised: 05/13/2016 Document Reviewed: 08/06/2013 Elsevier Interactive Patient Education  2017 [employer].

## 2018-02-09 NOTE — Telephone Encounter (Signed)
Per Maudie Mercury (Dinah's assistant) patient to be seen today at 3:00 pm. Appointment scheduled and I called Lina Sayre [facility] Nurse) to inform her of course of action.  Maudie Mercury was still with patient and informed him of appointment today with Dinah at 3:00 pm   No further action required.

## 2018-02-09 NOTE — Telephone Encounter (Signed)
Robbi Garter (Nurse) with [facility] called in the presence of patient. Patient c/o 1 episode of urinating pure blood.   Patient denies pain or discomfort. Patient is requesting appointment today with [facility].

## 2018-02-09 NOTE — Telephone Encounter (Signed)
I called [facility] number to speak with CMA and Provider to consult about course of action, no answer, unable to leave message.  I called Dinah on her Mobile number, no answer. Left message requesting return call.  I called Dinah's medical assistant and she was at lunch, will await for Dinah to return call.

## 2018-02-09 NOTE — Telephone Encounter (Signed)
Joelene Millin [facility] called back with patient's vitals for they had not been obtained when call originally came in.  B/P: 122/70 Pulse: 90 (patient is anxious about situation) Temp: 97.3  Patient is not in any distress yet anxious. Joelene Millin was informed of efforts made to contact provider and CMA. Awaiting response from Webb Silversmith, NP

## 2018-02-09 NOTE — Progress Notes (Signed)
Location:  [place] of Service:  Clinic (12) Provider: Dinah Ngetich FNP-C  Blanchie Serve, MD  Patient Care Team: Blanchie Serve, MD as PCP - General (Internal Medicine) Clent Jacks, MD as Consulting Physician (Ophthalmology) Lindwood Coke, MD as Consulting Physician (Dermatology) Thornell Sartorius, MD as Consulting Physician (Otolaryngology) Richmond Campbell, MD as Consulting Physician (Gastroenterology) Melina Modena, [facility]  Extended Emergency Contact Information Primary Emergency Contact: Justo,Peggy Address: [id number]          [place] [address] [place] Phone: [phone] Relation: Spouse   Goals of care: Advanced Directive information Advanced Directives 02/09/2018  Does Patient Have a Medical Advance Directive? Yes  Type of Paramedic of [place];Living will  Does patient want to make changes to medical advance directive? -  Copy of [place] in Chart? Yes  Would patient like information on creating a medical advance directive? -     Chief Complaint  Patient presents with  . Acute Visit    hematuria-no abd pain, no back pain, no fever    HPI:  Pt is a 84 y.o. male seen today at [facility] for an acute visit for evaluation of blood in the urine.He states has had blood in the urine since yesterday but seems worst today.He was seen by MD 01/08/2018 for dizziness Antivert refilled by has not taken it.He describes lumbs like " long like caterpillar". He denies any fever,chills,abdominal pain,Nausea,vomiting or back pain.Of note he states has a history of BPH without any urinary obstruction.He is currently on Asprin 81 mg tablet daily.He states blood sugar was within normal range today.      Past Medical History:  Diagnosis Date  . Abdominal pain, other specified site   . Allergic rhinitis due to pollen   . Benign paroxysmal positional vertigo   . Disturbance of  skin sensation    left great toe  . Elevated prostate specific antigen (PSA)   . Hematuria, unspecified   . Hypertrophy of prostate without urinary obstruction and other lower urinary tract symptoms (LUTS)   . Impotence of organic origin   . Left knee pain 05/28/2014  . Macrocytosis   . Other and unspecified hyperlipidemia   . Other malaise and fatigue   . Spermatocele    bilateral  . Type II or unspecified type diabetes mellitus without mention of complication, uncontrolled   . Unspecified essential hypertension   . Unspecified glaucoma(365.9)    Past Surgical History:  Procedure Laterality Date  . CATARACT EXTRACTION EXTRACAPSULAR  2008   bilateraly Dr. Katy Fitch  . COLONOSCOPY  01/03/2002   normal Dr. Earlean Shawl  . PROSTATE BIOPSY  1995   due to elevated PSA normal  . TONSILLECTOMY      Allergies  Allergen Reactions  . Metformin And Related   . Sulfa Antibiotics   . Tetanus Toxoids   . Typhoid Vaccines     Outpatient Encounter Medications as of 02/09/2018  Medication Sig  . aspirin 81 MG tablet Take 81 mg by mouth daily.  Marland Kitchen glucose blood test strip Use one strip to test blood sugar once a day before breakfast . Dx E11.9  . JANUVIA 100 MG tablet TAKE 1 TABLET BY MOUTH EVERY MORNING TO CONTROL DIABETES  . meclizine (ANTIVERT) 25 MG tablet Take 1 tablet (25 mg total) by mouth 2 (two) times daily.  Marland Kitchen MICROLET LANCETS MISC Use one lancet each morning to test blood sugar. Dx. E11.9  .  pioglitazone (ACTOS) 45 MG tablet TAKE 1 TABLET BY MOUTH EVERY DAY TO CONTROL BLOOD SUGAR  . Selenium 200 MCG TABS Take by mouth. Take one tablet daily  . vitamin B-12 (CYANOCOBALAMIN) 1000 MCG tablet Take 1 tablet (1,000 mcg total) by mouth daily.   No facility-administered encounter medications on file as of 02/09/2018.     Review of Systems  Constitutional: Negative for chills, fatigue and fever.  HENT: Negative for congestion, rhinorrhea, sinus pressure, sinus pain, sneezing and sore throat.     Respiratory: Negative for cough, chest tightness, shortness of breath and wheezing.   Cardiovascular: Negative for chest pain, palpitations and leg swelling.  Gastrointestinal: Negative for abdominal distention, abdominal pain, blood in stool, constipation, diarrhea, nausea and vomiting.  Genitourinary: Positive for hematuria. Negative for decreased urine volume, difficulty urinating, dysuria, flank pain, frequency, penile pain, penile swelling, scrotal swelling, testicular pain and urgency.  Musculoskeletal: Negative for back pain and myalgias.  Skin: Negative for color change, pallor and rash.  Neurological: Positive for dizziness. Negative for syncope, light-headedness and headaches.  Hematological: Does not bruise/bleed easily.  Psychiatric/Behavioral: Negative for agitation, confusion and sleep disturbance. The patient is not nervous/anxious.     Immunization History  Administered Date(s) Administered  . Influenza Whole 09/19/2012, 09/20/2013  . Influenza, High Dose Seasonal PF 09/28/2017  . Influenza-Unspecified 10/03/2014, 09/18/2015, 09/30/2016  . Pneumococcal Conjugate-13 08/27/2017  . Pneumococcal Polysaccharide-23 10/24/2001   Pertinent  Health Maintenance Due  Topic Date Due  . OPHTHALMOLOGY EXAM  12/31/2016  . URINE MICROALBUMIN  07/25/2018  . HEMOGLOBIN A1C  07/30/2018  . FOOT EXAM  11/09/2018  . INFLUENZA VACCINE  Completed  . PNA vac Low Risk Adult  Completed   Fall Risk  08/26/2017 07/27/2016 01/27/2016 07/22/2015 01/28/2015  Falls in the past year? No No No No No    Vitals:   02/09/18 1500  BP: 126/76  Pulse: 84  Resp: 16  Temp: 98.2 F (36.8 C)  TempSrc: Oral  SpO2: 99%  Weight: 151 lb 9.6 oz (68.8 kg)  Height: 5\' 5"  (1.651 m)   Body mass index is 25.23 kg/m. Physical Exam  Constitutional: He is oriented to person, place, and time. He appears well-developed and well-nourished.  Pleasant elderly in no acute distress   HENT:  Head: Normocephalic.   Mouth/Throat: Oropharynx is clear and moist. No oropharyngeal exudate.  Eyes: Conjunctivae and EOM are normal. Pupils are equal, round, and reactive to light. Right eye exhibits no discharge. Left eye exhibits no discharge. No scleral icterus.  Eye glasses in place   Neck: Normal range of motion. No JVD present. No thyromegaly present.  Cardiovascular: Normal rate, regular rhythm, normal heart sounds and intact distal pulses. Exam reveals no gallop and no friction rub.  No murmur heard. Pulmonary/Chest: Effort normal and breath sounds normal. No respiratory distress. He has no wheezes. He has no rales.  Abdominal: Soft. Bowel sounds are normal. He exhibits no distension. There is no tenderness. There is no rebound and no guarding.  Lymphadenopathy:    He has no cervical adenopathy.  Neurological: He is oriented to person, place, and time. Coordination normal.  Skin: Skin is warm and dry. No rash noted. No erythema. No pallor.  Psychiatric: He has a normal mood and affect. His behavior is normal.   Labs reviewed: Recent Labs    07/25/17 0800 10/28/17 0000 01/30/18 0000  NA 141 141 141  K 4.0 4.1 4.4  CL 104 105 105  CO2 27 30 28  GLUCOSE 113* 120* 135*  BUN 20 24 20   CREATININE 0.76 0.69* 0.71  CALCIUM 8.9 8.7 8.6   Recent Labs    07/08/17 0807 07/25/17 0800 01/30/18 0000  AST 15 15 15   ALT 9 8* 7*  ALKPHOS 69 70  --   BILITOT 1.0 0.8 0.7  PROT 6.1 6.4 6.2  ALBUMIN 3.9 4.1  --    Recent Labs    07/08/17 0807 07/25/17 0800 01/30/18 0000  WBC 5.5 6.7 6.7  NEUTROABS 3,190 4,154  --   HGB 13.0* 13.5 13.0*  HCT 40.2 40.5 37.3*  MCV 100.8* 100.0 97.4  PLT 311 292 333   Lab Results  Component Value Date   TSH 2.67 01/30/2018   Lab Results  Component Value Date   HGBA1C 7.3 (H) 01/30/2018   Lab Results  Component Value Date   CHOL 132 01/30/2018   HDL 54 01/30/2018   LDLCALC 51 07/25/2017   TRIG 106 01/30/2018   CHOLHDL 2.4 01/30/2018    Significant  Diagnostic Results in last 30 days:  No results found.  Assessment/Plan 1. Gross hematuria Afebrile.Negative exam findings.urine collected during visit for U/A and C/S rule UTI.urine color bloody concerning for UTI though asymptomatic. With his advance age and history of BPH cannot rule out malignancy verse Kidney stone or infectious etiologies. Recent  Hgb 13.0 and platelets remain stable compared to previous level. Will send to ER for Further evaluation. Patient instructed to Hold Asprin for now until further evaluation.   2. Dizziness On Antivert 25 mg tablet twice daily but states has not taken medication.H/H stable.Send to ER for further evaluation due to above hematuria which could be worsening the dizziness.    Family/ staff Communication: Reviewed plan of care with patient.   Labs/tests ordered: U/A and C/S rule out UTI   Sandrea Hughs, NP

## 2018-02-09 NOTE — ED Triage Notes (Signed)
Pt complaint of blood in urine; denies hx of same.

## 2018-02-09 NOTE — ED Provider Notes (Signed)
[facility] DEPT Provider Note   CSN: [id number] Arrival date & time: 02/09/18  1551     History   Chief Complaint Chief Complaint  Patient presents with  . Hematuria    HPI George [name] is a 84 y.o. male presenting for evaluation of hematuria.  Patient states that around noon today, he was urinating and voiding dark red thick urine.  He reports pain due to the fact that he needs to push and apply pressure to release any urine.  Pt states his urine is red and passing small clots during urination. He denies history of similar.  He denies history of hematuria yesterday.  He denies no pain when he is not urinating.  He denies fevers, chills, chest pain, shortness of breath, nausea, vomiting, abdominal pain, or abnormal bowel movements. He has no other medical problems.   HPI  Past Medical History:  Diagnosis Date  . Abdominal pain, other specified site   . Allergic rhinitis due to pollen   . Benign paroxysmal positional vertigo   . Disturbance of skin sensation    left great toe  . Elevated prostate specific antigen (PSA)   . Hematuria, unspecified   . Hypertrophy of prostate without urinary obstruction and other lower urinary tract symptoms (LUTS)   . Impotence of organic origin   . Left knee pain 05/28/2014  . Macrocytosis   . Other and unspecified hyperlipidemia   . Other malaise and fatigue   . Spermatocele    bilateral  . Type II or unspecified type diabetes mellitus without mention of complication, uncontrolled   . Unspecified essential hypertension   . Unspecified glaucoma(365.9)     Patient Active Problem List   Diagnosis Date Noted  . Memory loss 11/09/2017  . Paresthesia 10/12/2016  . Macrocytosis 10/12/2016  . Light-headed feeling 09/21/2016  . Right knee pain 07/01/2015  . Left knee pain 05/28/2014  . Deaf 01/22/2014  . DM type 2 (diabetes mellitus, type 2) ([place])   . Hyperlipidemia   . Benign paroxysmal positional vertigo    . Essential hypertension   . BPH (benign prostatic hyperplasia)   . Impotence of organic origin     Past Surgical History:  Procedure Laterality Date  . CATARACT EXTRACTION EXTRACAPSULAR  2008   bilateraly Dr. Katy Fitch  . COLONOSCOPY  01/03/2002   normal Dr. Earlean Shawl  . PROSTATE BIOPSY  1995   due to elevated PSA normal  . TONSILLECTOMY         Home Medications    Prior to Admission medications   Medication Sig Start Date End Date Taking? Authorizing Provider  aspirin 81 MG tablet Take 81 mg by mouth daily.   Yes [provider]  JANUVIA 100 MG tablet TAKE 1 TABLET BY MOUTH EVERY MORNING TO CONTROL DIABETES 03/23/17  Yes Estill Dooms, MD  meclizine (ANTIVERT) 25 MG tablet Take 1 tablet (25 mg total) by mouth 2 (two) times daily. 02/08/18  Yes Blanchie Serve, MD  pioglitazone (ACTOS) 45 MG tablet TAKE 1 TABLET BY MOUTH EVERY DAY TO CONTROL BLOOD SUGAR 02/14/17  Yes Estill Dooms, MD  Selenium 200 MCG TABS Take 1 tablet by mouth daily.    Yes [provider]  vitamin B-12 (CYANOCOBALAMIN) 1000 MCG tablet Take 1 tablet (1,000 mcg total) by mouth daily. 11/09/17  Yes Blanchie Serve, MD  cephALEXin (KEFLEX) 500 MG capsule Take 1 capsule (500 mg total) by mouth 4 (four) times daily for 7 days. 02/10/18 02/17/18  Rohn Fritsch, PA-C  glucose blood test strip Use one strip to test blood sugar once a day before breakfast . Dx E11.9 02/08/18   Blanchie Serve, MD  MICROLET LANCETS MISC Use one lancet each morning to test blood sugar. Dx. E11.9 02/08/18   Blanchie Serve, MD    Family History Family History  Problem Relation Age of Onset  . Cancer Father        lung  . Cancer Brother        adrenal gland  . Cancer Son        liver    Social History Social History   Tobacco Use  . Smoking status: Former Smoker    Years: 4.00    Last attempt to quit: 04/09/1948    Years since quitting: 69.8  . Smokeless tobacco: Never Used  Substance Use Topics  . Alcohol use: No    . Drug use: No     Allergies   Metformin and related; Sulfa antibiotics; Tetanus toxoids; and Typhoid vaccines   Review of Systems Review of Systems  Genitourinary: Positive for dysuria and hematuria.  All other systems reviewed and are negative.    Physical Exam Updated Vital Signs BP 127/74 (BP Location: Right Arm)   Pulse 76   Temp 98.2 F (36.8 C) (Oral)   Resp 18   SpO2 95%   Physical Exam  Constitutional: He is oriented to person, place, and time. He appears well-developed and well-nourished. No distress.  HENT:  Head: Normocephalic and atraumatic.  Eyes: Conjunctivae and EOM are normal. Pupils are equal, round, and reactive to light.  Neck: Normal range of motion. Neck supple.  Cardiovascular: Normal rate, regular rhythm and intact distal pulses.  Pulmonary/Chest: Effort normal and breath sounds normal. No respiratory distress. He has no wheezes.  Abdominal: Soft. Bowel sounds are normal. He exhibits no distension and no mass. There is no tenderness. There is no guarding.  No tenderness to palpation of the abdomen.  Abdomen is soft without rigidity, guarding, distention.  Musculoskeletal: Normal range of motion.  Neurological: He is alert and oriented to person, place, and time.  Skin: Skin is warm and dry.  Psychiatric: He has a normal mood and affect.  Nursing note and vitals reviewed.    ED Treatments / Results  Labs (all labs ordered are listed, but only abnormal results are displayed) Labs Reviewed  URINALYSIS, ROUTINE W REFLEX MICROSCOPIC - Abnormal; Notable for the following components:      Result Value   Color, Urine RED (*)    APPearance TURBID (*)    Glucose, UA   (*)    Value: TEST NOT REPORTED DUE TO COLOR INTERFERENCE OF URINE PIGMENT   Hgb urine dipstick   (*)    Value: TEST NOT REPORTED DUE TO COLOR INTERFERENCE OF URINE PIGMENT   Bilirubin Urine   (*)    Value: TEST NOT REPORTED DUE TO COLOR INTERFERENCE OF URINE PIGMENT   Ketones, ur    (*)    Value: TEST NOT REPORTED DUE TO COLOR INTERFERENCE OF URINE PIGMENT   Protein, ur   (*)    Value: TEST NOT REPORTED DUE TO COLOR INTERFERENCE OF URINE PIGMENT   Nitrite   (*)    Value: TEST NOT REPORTED DUE TO COLOR INTERFERENCE OF URINE PIGMENT   Leukocytes, UA   (*)    Value: TEST NOT REPORTED DUE TO COLOR INTERFERENCE OF URINE PIGMENT   All other components within normal limits  BASIC  METABOLIC PANEL - Abnormal; Notable for the following components:   Glucose, Bld 147 (*)    Calcium 8.8 (*)    All other components within normal limits  CBC - Abnormal; Notable for the following components:   RBC 3.88 (*)    Hemoglobin 12.9 (*)    MCV 100.8 (*)    RDW 18.0 (*)    All other components within normal limits  URINE CULTURE  URINALYSIS, MICROSCOPIC (REFLEX)    EKG  EKG Interpretation None       Radiology Ct Abdomen Pelvis W Contrast  Result Date: 02/09/2018 CLINICAL DATA:  Hematuria EXAM: CT ABDOMEN AND PELVIS WITH CONTRAST TECHNIQUE: Multidetector CT imaging of the abdomen and pelvis was performed using the standard protocol following bolus administration of intravenous contrast. CONTRAST:  147mL ISOVUE-300 IOPAMIDOL (ISOVUE-300) INJECTION 61% COMPARISON:  09/06/2016 FINDINGS: Lower chest: Lung bases demonstrate no acute consolidation or pleural effusion. Heart size within normal limits Hepatobiliary: Stable cyst in the central liver. No calcified gallstones or biliary dilatation Pancreas: Unremarkable. No pancreatic ductal dilatation or surrounding inflammatory changes. Spleen: Normal in size without focal abnormality. Adrenals/Urinary Tract: Adrenal glands are normal. Small cysts within the kidneys. Subcentimeter lesions too small to further characterize. No hydronephrosis. Irregular bladder wall thickening with cystic changes of the anterior bladder wall. Stomach/Bowel: Stomach is within normal limits. Appendix not well seen but no right lower quadrant inflammation. No  evidence of bowel wall thickening, distention, or inflammatory changes. Vascular/Lymphatic: Marked aortic atherosclerosis. No aneurysmal dilatation. No significantly enlarged lymph nodes. Reproductive: Enlarged prostate gland with mass effect on the bladder. Other: Negative for free air or free fluid. Small fat in the right inguinal canal Musculoskeletal: Degenerative changes of the spine. No acute or suspicious bone lesion. IMPRESSION: 1. Prostatomegaly with mass effect on the posterior bladder. Irregular wall thickening and trabeculation of the bladder similar to the previous CT, this could be due to chronic obstruction (favored) or possibly cystitis. 2. There are no other acute abnormalities visualized. Electronically Signed   By: Donavan Foil M.D.   On: 02/09/2018 23:22    Procedures Procedures (including critical care time)  Medications Ordered in ED Medications  iopamidol (ISOVUE-300) 61 % injection (100 mLs Intravenous Contrast Given 02/09/18 2258)     Initial Impression / Assessment and Plan / ED Course  I have reviewed the triage vital signs and the nursing notes.  Pertinent labs & imaging results that were available during my care of the patient were reviewed by me and considered in my medical decision making (see chart for details).     Presenting for evaluation of hematuria.  No other complaints.  Physical exam reassuring, he is afebrile not tachycardic.  He appears nontoxic.  He has no pain at rest.  No tenderness palpation of the abdomen.  UA not helpful due to amount of blood.  Labs reassuring, no leukocytosis.  Kidney function stable.  No change in hemoglobin.  Doubt anemia due to bleeding or kidney injury.  However, as patient is having clots expelled during urination, will obtain CT scan for further evaluation of kidneys and bladder.  CT shows prostatomegaly with mass-effect on the posterior bladder and irregular wall thickening of the bladder, possibly due to chronic  obstruction or cystitis. Discussed findings with Dr. Shanon Brow from urology, who states pt can be followed up OP. Post-void bladder scan ordered.   Post-void bladder scan showed 94 mL.  Pre-bladder scan 274.  I do not believe patient needs a Foley at this time.  Discussed importance of hydration with patient.  Will give antibiotics for possible UTI.  Patient instructed to follow-up with urology.  At this time, patient present for discharge.  Return precautions given.  Patient states he understands and agrees to plan.   Final Clinical Impressions(s) / ED Diagnoses   Final diagnoses:  Hematuria, unspecified type  Acute cystitis with hematuria    ED Discharge Orders        Ordered    cephALEXin (KEFLEX) 500 MG capsule  4 times daily     02/10/18 0035       Franchot Heidelberg, PA-C 02/10/18 0045    Tegeler, Gwenyth Allegra, MD 02/11/18 1153

## 2018-02-10 LAB — URINALYSIS, ROUTINE W REFLEX MICROSCOPIC

## 2018-02-10 MED ORDER — CEPHALEXIN 500 MG PO CAPS: 500.0000 mg | ORAL_CAPSULE | Freq: Four times a day (QID) | ORAL | 0 refills | Status: AC

## 2018-02-10 NOTE — Discharge Instructions (Signed)
Continue taking all your normal medications. Make sure you are staying well-hydrated with water. Antibiotics as prescribed.  Take the entire course, even if your symptoms improve. Follow up with the urologist for further evaluation of your urination. Return to the emergency room if you develop persistent pain, high fevers, vomiting, or any new or concerning symptoms.

## 2018-02-11 ENCOUNTER — Encounter (HOSPITAL_COMMUNITY): Payer: Self-pay | Admitting: Emergency Medicine

## 2018-02-11 ENCOUNTER — Other Ambulatory Visit: Payer: Self-pay

## 2018-02-11 ENCOUNTER — Emergency Department (HOSPITAL_COMMUNITY)
Admission: EM | Admit: 2018-02-11 | Discharge: 2018-02-11 | Disposition: A | Discharge status: Home / Self Care | Payer: Medicare Other | Attending: Emergency Medicine | Admitting: Emergency Medicine

## 2018-02-11 DIAGNOSIS — E119 Type 2 diabetes mellitus without complications: Secondary | ICD-10-CM | POA: Insufficient documentation

## 2018-02-11 DIAGNOSIS — Z7982 Long term (current) use of aspirin: Secondary | ICD-10-CM | POA: Diagnosis not present

## 2018-02-11 DIAGNOSIS — R319 Hematuria, unspecified: Secondary | ICD-10-CM | POA: Diagnosis not present

## 2018-02-11 DIAGNOSIS — I10 Essential (primary) hypertension: Secondary | ICD-10-CM | POA: Diagnosis not present

## 2018-02-11 DIAGNOSIS — R42 Dizziness and giddiness: Secondary | ICD-10-CM | POA: Diagnosis not present

## 2018-02-11 DIAGNOSIS — R310 Gross hematuria: Secondary | ICD-10-CM | POA: Insufficient documentation

## 2018-02-11 DIAGNOSIS — Z79899 Other long term (current) drug therapy: Secondary | ICD-10-CM | POA: Diagnosis not present

## 2018-02-11 DIAGNOSIS — Z87891 Personal history of nicotine dependence: Secondary | ICD-10-CM | POA: Insufficient documentation

## 2018-02-11 DIAGNOSIS — Z7984 Long term (current) use of oral hypoglycemic drugs: Secondary | ICD-10-CM | POA: Diagnosis not present

## 2018-02-11 LAB — BASIC METABOLIC PANEL
ANION GAP: 10 (ref 5–15)
BUN: 24 mg/dL — ABNORMAL HIGH (ref 6–20)
CALCIUM: 8.6 mg/dL — AB (ref 8.9–10.3)
CHLORIDE: 104 mmol/L (ref 101–111)
CO2: 23 mmol/L (ref 22–32)
Creatinine, Ser: 0.79 mg/dL (ref 0.61–1.24)
GFR calc Af Amer: >60 mL/min (ref >60)
GFR calc non Af Amer: >60 mL/min (ref >60)
GLUCOSE: 179 mg/dL — AB (ref 65–99)
Potassium: 4.2 mmol/L (ref 3.5–5.1)
Sodium: 137 mmol/L (ref 135–145)

## 2018-02-11 LAB — CBC WITH DIFFERENTIAL/PLATELET
BASOS ABS: 0 10*3/uL (ref 0.0–0.1)
Basophils Relative: 0 %
Eosinophils Absolute: 0.1 10*3/uL (ref 0.0–0.7)
Eosinophils Relative: 2 %
HEMATOCRIT: 35.6 % — AB (ref 39.0–52.0)
Hemoglobin: 11.9 g/dL — ABNORMAL LOW (ref 13.0–17.0)
LYMPHS PCT: 12 %
Lymphs Abs: 1 10*3/uL (ref 0.7–4.0)
MCH: 33.3 pg (ref 26.0–34.0)
MCHC: 33.4 g/dL (ref 30.0–36.0)
MCV: 99.7 fL (ref 78.0–100.0)
MONO ABS: 1.1 10*3/uL — AB (ref 0.1–1.0)
MONOS PCT: 14 %
NEUTROS ABS: 5.9 10*3/uL (ref 1.7–7.7)
Neutrophils Relative %: 72 %
Platelets: 269 10*3/uL (ref 150–400)
RBC: 3.57 MIL/uL — ABNORMAL LOW (ref 4.22–5.81)
RDW: 18.1 % — ABNORMAL HIGH (ref 11.5–15.5)
WBC: 8.1 10*3/uL (ref 4.0–10.5)

## 2018-02-11 LAB — URINALYSIS, MICROSCOPIC (REFLEX): BACTERIA UA: NONE SEEN

## 2018-02-11 LAB — URINALYSIS, ROUTINE W REFLEX MICROSCOPIC

## 2018-02-11 LAB — URINE CULTURE: Culture: 20000 — AB

## 2018-02-11 NOTE — ED Triage Notes (Signed)
Pt brought in by EMS from [facility] with c/o hematuria.  Pt reports that he continues to have "pinkish urine.  Pt was seen emergently on 02/09/18 for same and subsequently discharged home with oral antibiotics for acute cystitis.  Pt currently on his 7-day course of Keflex 500 mg QID.

## 2018-02-11 NOTE — Discharge Instructions (Signed)
Please continue to increase your water intake at home.  Please continue your Keflex as prescribed.  Please follow-up closely with your primary care physician and urologist.

## 2018-02-11 NOTE — ED Provider Notes (Signed)
TIME SEEN: 2:55 AM  CHIEF COMPLAINT: Gross hematuria  HPI: Patient is a 84 year old male with history of BPH, hypertension, diabetes who presents emergency department with gross hematuria.  Was seen here in the emergency department less than 48 hours ago for the same and started on Keflex for possible UTI.  Labs at that time were unremarkable.  Urine culture was sent and is pending.  He did have a CT of his abdomen pelvis that showed enlarged prostate with signs of cystitis.  Was able to urinate without difficulty and was passing small clots.  States that this has not changed at all but tonight he felt lightheaded.  His daughter thinks this was because he got upset because of the blood.  He states that he is still able to urinate and empty his bladder fully.  Not on antiplatelets or anticoagulants.  States he tried to schedule appointment as an outpatient with [facility].  ROS: See HPI Constitutional: no fever  Eyes: no drainage  ENT: no runny nose   Cardiovascular:  no chest pain  Resp: no SOB  GI: no vomiting GU: no dysuria Integumentary: no rash  Allergy: no hives  Musculoskeletal: no leg swelling  Neurological: no slurred speech ROS otherwise negative  PAST MEDICAL HISTORY/PAST SURGICAL HISTORY:  Past Medical History:  Diagnosis Date  . Abdominal pain, other specified site   . Allergic rhinitis due to pollen   . Benign paroxysmal positional vertigo   . Disturbance of skin sensation    left great toe  . Elevated prostate specific antigen (PSA)   . Hematuria, unspecified   . Hypertrophy of prostate without urinary obstruction and other lower urinary tract symptoms (LUTS)   . Impotence of organic origin   . Left knee pain 05/28/2014  . Macrocytosis   . Other and unspecified hyperlipidemia   . Other malaise and fatigue   . Spermatocele    bilateral  . Type II or unspecified type diabetes mellitus without mention of complication, uncontrolled   . Unspecified essential  hypertension   . Unspecified glaucoma(365.9)     MEDICATIONS:  Prior to Admission medications   Medication Sig Start Date End Date Taking? Authorizing Provider  aspirin 81 MG tablet Take 81 mg by mouth daily.   Yes [provider]  cephALEXin (KEFLEX) 500 MG capsule Take 1 capsule (500 mg total) by mouth 4 (four) times daily for 7 days. 02/10/18 02/17/18 Yes Caccavale, Sophia, PA-C  glucose blood test strip Use one strip to test blood sugar once a day before breakfast . Dx E11.9 02/08/18  Yes Bubba Camp, Mahima, MD  JANUVIA 100 MG tablet TAKE 1 TABLET BY MOUTH EVERY MORNING TO CONTROL DIABETES 03/23/17  Yes Estill Dooms, MD  meclizine (ANTIVERT) 25 MG tablet Take 1 tablet (25 mg total) by mouth 2 (two) times daily. 02/08/18  Yes Blanchie Serve, MD  MICROLET LANCETS MISC Use one lancet each morning to test blood sugar. Dx. E11.9 02/08/18  Yes Bubba Camp, Mahima, MD  pioglitazone (ACTOS) 45 MG tablet TAKE 1 TABLET BY MOUTH EVERY DAY TO CONTROL BLOOD SUGAR 02/14/17  Yes Estill Dooms, MD  Selenium 200 MCG TABS Take 1 tablet by mouth daily.    Yes [provider]  vitamin B-12 (CYANOCOBALAMIN) 1000 MCG tablet Take 1 tablet (1,000 mcg total) by mouth daily. 11/09/17  Yes Blanchie Serve, MD    ALLERGIES:  Allergies  Allergen Reactions  . Metformin And Related Other (See Comments)    unknown  . Sulfa  Antibiotics Swelling  . Tetanus Toxoids Other (See Comments)    unknown  . Typhoid Vaccines Other (See Comments)    unknown    SOCIAL HISTORY:  Social History   Tobacco Use  . Smoking status: Former Smoker    Years: 4.00    Last attempt to quit: 04/09/1948    Years since quitting: 69.8  . Smokeless tobacco: Never Used  Substance Use Topics  . Alcohol use: No    FAMILY HISTORY: Family History  Problem Relation Age of Onset  . Cancer Father        lung  . Cancer Brother        adrenal gland  . Cancer Son        liver    EXAM: BP 107/87   Pulse 85   Temp 97.9 F (36.6  C) (Oral)   Resp 18   Ht 5\' 5"  (1.651 m)   Wt 68 kg (150 lb)   SpO2 98%   BMI 24.96 kg/m  CONSTITUTIONAL: Alert and oriented and responds appropriately to questions. Well-appearing; well-nourished HEAD: Normocephalic EYES: Conjunctivae clear, pupils appear equal, EOMI ENT: normal nose; moist mucous membranes NECK: Supple, no meningismus, no nuchal rigidity, no LAD  CARD: RRR; S1 and S2 appreciated; no murmurs, no clicks, no rubs, no gallops RESP: Normal chest excursion without splinting or tachypnea; breath sounds clear and equal bilaterally; no wheezes, no rhonchi, no rales, no hypoxia or respiratory distress, speaking full sentences ABD/GI: Normal bowel sounds; non-distended; soft, non-tender, no rebound, no guarding, no peritoneal signs, no hepatosplenomegaly BACK:  The back appears normal and is non-tender to palpation, there is no CVA tenderness EXT: Normal ROM in all joints; non-tender to palpation; no edema; normal capillary refill; no cyanosis, no calf tenderness or swelling    SKIN: Normal color for age and race; warm; no rash NEURO: Moves all extremities equally PSYCH: The patient's mood and manner are appropriate. Grooming and personal hygiene are appropriate.  MEDICAL DECISION MAKING: Patient here with continued gross hematuria.  He is very well-appearing here.  Able to empty his bladder fully.  Urine culture from 2 days ago is pending.  He is on Keflex currently.  Hemoglobin went from 12.9 to 11.9.  Normal creatinine.  I recommend he increase his water intake at home and continue antibiotics.  He will call [facility] for close follow-up on Monday.  At this time I do not feel he needs a Foley catheter given he is able to empty his bladder and he agrees and does not want a Foley catheter.  We have discussed that if he were to get worse that he may need to come back to the hospital for continuous bladder irrigation and Foley catheter placement.  He is comfortable with this plan.   Discussed at length return precautions.  Patient and family verbalized understanding.  At this time, I do not feel there is any life-threatening condition present. I have reviewed and discussed all results (EKG, imaging, lab, urine as appropriate) and exam findings with patient/family. I have reviewed nursing notes and appropriate previous records.  I feel the patient is safe to be discharged home without further emergent workup and can continue workup as an outpatient as needed. Discussed usual and customary return precautions. Patient/family verbalize understanding and are comfortable with this plan.  Outpatient follow-up has been provided if needed. All questions have been answered.      Ayme Short, Delice Bison, DO 02/11/18 [phone]

## 2018-02-11 NOTE — ED Notes (Signed)
Bed: HT09 Expected date:  Expected time:  Means of arrival:  Comments: EMS elderly feels weak

## 2018-02-12 ENCOUNTER — Telehealth: Payer: Self-pay

## 2018-02-12 LAB — URINALYSIS W MICROSCOPIC + REFLEX CULTURE
Bilirubin Urine: NEGATIVE
Glucose, UA: NEGATIVE
Hyaline Cast: NONE SEEN /LPF
KETONES UR: NEGATIVE
Nitrites, Initial: POSITIVE — AB
PH: 7 (ref 5.0–8.0)
SQUAMOUS EPITHELIAL / LPF: NONE SEEN /HPF (ref <=5)
Specific Gravity, Urine: 1.013 (ref 1.001–1.03)

## 2018-02-12 LAB — URINE CULTURE
MICRO NUMBER: 90235883
SPECIMEN QUALITY:: ADEQUATE

## 2018-02-12 LAB — CULTURE INDICATED

## 2018-02-12 NOTE — Telephone Encounter (Signed)
Post ED Visit - Positive Culture Follow-up  Culture report reviewed by antimicrobial stewardship pharmacist:  []  Elenor Quinones, Pharm.D. []  Heide Guile, Pharm.D., BCPS AQ-ID []  Parks Neptune, Pharm.D., BCPS []  Alycia Rossetti, Pharm.D., BCPS []  [place], [place].D., BCPS, AAHIVP []  Legrand Como, Pharm.D., BCPS, AAHIVP []  Salome Arnt, PharmD, BCPS []  Jalene Mullet, PharmD []  Vincenza Hews, PharmD, BCPS [facility] Pharm D  Positive urine culture Treated with Cephalexin, and no further patient follow-up is required at this time.  Genia Del 02/12/2018, 8:56 AM

## 2018-02-13 ENCOUNTER — Encounter: Payer: Self-pay | Admitting: Internal Medicine

## 2018-02-13 ENCOUNTER — Telehealth: Payer: Self-pay

## 2018-02-13 ENCOUNTER — Non-Acute Institutional Stay: Payer: Medicare Other | Admitting: Internal Medicine

## 2018-02-13 VITALS — BP 132/64 | HR 90 | Temp 98.1°F | Resp 16 | Ht 65.0 in | Wt 153.8 lb

## 2018-02-13 DIAGNOSIS — R310 Gross hematuria: Secondary | ICD-10-CM | POA: Diagnosis not present

## 2018-02-13 DIAGNOSIS — N3001 Acute cystitis with hematuria: Secondary | ICD-10-CM | POA: Diagnosis not present

## 2018-02-13 NOTE — Telephone Encounter (Signed)
Patient's daughter called asking for a referral to urology due to patient having blood in urine. Patient has been seen twice in ED due to this. Family would also like any recommendations or advise that provider can give.   Please call Benjamine Mola at [phone].

## 2018-02-13 NOTE — Progress Notes (Signed)
[facility]  Provider: Blanchie Serve MD   Location:  [place] of Service:  Clinic (12)  PCP: Blanchie Serve, MD Patient Care Team: Blanchie Serve, MD as PCP - General (Internal Medicine) Clent Jacks, MD as Consulting Physician (Ophthalmology) Lindwood Coke, MD as Consulting Physician (Dermatology) Thornell Sartorius, MD as Consulting Physician (Otolaryngology) Richmond Campbell, MD as Consulting Physician (Gastroenterology) Melina Modena, [facility]  Extended Emergency Contact Information Primary Emergency Contact: Schoenbeck,Peggy Address: [id number]          [place] [address] [place] [address] Phone: [phone] Relation: Spouse Secondary Emergency Contact: Levoy, Geisen [facility] Phone: [phone] Mobile Phone: [phone] Relation: Daughter  Code Status: DNR  Goals of Care: Advanced Directive information Advanced Directives 02/11/2018  Does Patient Have a Medical Advance Directive? Yes  Type of Advance Directive Out of facility DNR (pink MOST or yellow form)  Does patient want to make changes to medical advance directive? -  Copy of [place] in Chart? -  Would patient like information on creating a medical advance directive? -     Chief Complaint  Patient presents with  . Follow-up    hematuria  . Medication Refill    No refills needed at this time.   Marland Kitchen Appointment     has urology appointment at 02/17/18 at 9:15 am Jiles Crocker    HPI: Patient is a 84 y.o. male seen today for acute visit for follow up on hematuria. He has been having gross hematuria. He is currently on antibiotic for acute cystitis. He has visited ED twice for hematuria and CT abdomen/pelvis shows irregular bladder wall thickening with cystic changes to anterior bladder wall and enlarged prostate gland with mass effect on bladder. Patient seen today with his daughter present. He continues to have blood in his urine.    Past Medical History:  Diagnosis Date  . Abdominal pain, other specified site   . Allergic rhinitis due to pollen   . Benign paroxysmal positional vertigo   . Disturbance of skin sensation    left great toe  . Elevated prostate specific antigen (PSA)   . Hematuria, unspecified   . Hypertrophy of prostate without urinary obstruction and other lower urinary tract symptoms (LUTS)   . Impotence of organic origin   . Left knee pain 05/28/2014  . Macrocytosis   . Other and unspecified hyperlipidemia   . Other malaise and fatigue   . Spermatocele    bilateral  . Type II or unspecified type diabetes mellitus without mention of complication, uncontrolled   . Unspecified essential hypertension   . Unspecified glaucoma(365.9)    Past Surgical History:  Procedure Laterality Date  . CATARACT EXTRACTION EXTRACAPSULAR  2008   bilateraly Dr. Katy Fitch  . COLONOSCOPY  01/03/2002   normal Dr. Earlean Shawl  . PROSTATE BIOPSY  1995   due to elevated PSA normal  . TONSILLECTOMY      reports that he quit smoking about 69 years ago. He quit after 4.00 years of use. he has never used smokeless tobacco. He reports that he does not drink alcohol or use drugs. Social History   Socioeconomic History  . Marital status: Married    Spouse name: Not on file  . Number of children: Not on file  . Years of education: Not on file  . Highest education level: Not on file  Social Needs  . Financial resource strain: Not on file  .  Food insecurity - worry: Not on file  . Food insecurity - inability: Not on file  . Transportation needs - medical: Not on file  . Transportation needs - non-medical: Not on file  Occupational History  . Occupation: retired, self employed  Tobacco Use  . Smoking status: Former Smoker    Years: 4.00    Last attempt to quit: 04/09/1948    Years since quitting: 69.8  . Smokeless tobacco: Never Used  Substance and Sexual Activity  . Alcohol use: No  . Drug use: No  . Sexual activity:  Yes  Other Topics Concern  . Not on file  Social History Narrative   Lives at [facility]    Married    Living Will     Family History  Problem Relation Age of Onset  . Cancer Father        lung  . Cancer Brother        adrenal gland  . Cancer Son        liver    Health Maintenance  Topic Date Due  . Samul Dada  10/07/1943  . OPHTHALMOLOGY EXAM  12/31/2016  . URINE MICROALBUMIN  07/25/2018  . HEMOGLOBIN A1C  07/30/2018  . FOOT EXAM  11/09/2018  . INFLUENZA VACCINE  Completed  . PNA vac Low Risk Adult  Completed    Allergies  Allergen Reactions  . Metformin And Related Other (See Comments)    unknown  . Sulfa Antibiotics Swelling  . Tetanus Toxoids Other (See Comments)    unknown  . Typhoid Vaccines Other (See Comments)    unknown    Outpatient Encounter Medications as of 02/13/2018  Medication Sig  . aspirin 81 MG tablet Take 81 mg by mouth daily.  . cephALEXin (KEFLEX) 500 MG capsule Take 1 capsule (500 mg total) by mouth 4 (four) times daily for 7 days.  Marland Kitchen glucose blood test strip Use one strip to test blood sugar once a day before breakfast . Dx E11.9  . JANUVIA 100 MG tablet TAKE 1 TABLET BY MOUTH EVERY MORNING TO CONTROL DIABETES  . meclizine (ANTIVERT) 25 MG tablet Take 1 tablet (25 mg total) by mouth 2 (two) times daily.  Marland Kitchen MICROLET LANCETS MISC Use one lancet each morning to test blood sugar. Dx. E11.9  . pioglitazone (ACTOS) 45 MG tablet TAKE 1 TABLET BY MOUTH EVERY DAY TO CONTROL BLOOD SUGAR  . Selenium 200 MCG TABS Take 1 tablet by mouth daily.   . vitamin B-12 (CYANOCOBALAMIN) 1000 MCG tablet Take 1 tablet (1,000 mcg total) by mouth daily.   No facility-administered encounter medications on file as of 02/13/2018.     Review of Systems  Constitutional: Negative for appetite change, chills, fatigue and fever.  Respiratory: Negative for shortness of breath.   Cardiovascular: Negative for chest pain and palpitations.  Gastrointestinal:  Negative for abdominal pain, blood in stool, nausea and vomiting.  Genitourinary: Positive for frequency, hematuria and urgency. Negative for difficulty urinating, discharge, dysuria, flank pain, genital sores, scrotal swelling and testicular pain.  Musculoskeletal: Negative for back pain.  Skin: Negative for rash.  Neurological: Negative for dizziness, syncope and headaches.       Denies any further dizziness  Psychiatric/Behavioral: Negative for confusion.    Vitals:   02/13/18 1547  BP: 132/64  Pulse: 90  Resp: 16  Temp: 98.1 F (36.7 C)  TempSrc: Oral  SpO2: 99%  Weight: 153 lb 12.8 oz (69.8 kg)  Height: 5\' 5"  (1.651 m)  Body mass index is 25.59 kg/m. Physical Exam  Constitutional: He is oriented to person, place, and time. He appears well-developed and well-nourished. No distress.  HENT:  Head: Normocephalic and atraumatic.  Mouth/Throat: Oropharynx is clear and moist.  Eyes: Conjunctivae and EOM are normal. Right eye exhibits no discharge. Left eye exhibits no discharge.  Cardiovascular: Normal rate and regular rhythm.  Pulmonary/Chest: Effort normal and breath sounds normal. He has no wheezes. He has no rales.  Abdominal: Soft. Bowel sounds are normal. He exhibits no distension and no mass. There is no tenderness. There is no rebound and no guarding.  Musculoskeletal: Normal range of motion.  Neurological: He is alert and oriented to person, place, and time.  Skin: Skin is warm and dry. He is not diaphoretic.  Psychiatric: He has a normal mood and affect.    Labs reviewed: Basic Metabolic Panel: Recent Labs    01/30/18 0000 02/09/18 1656 02/11/18 0253  NA 141 139 137  K 4.4 4.2 4.2  CL 105 104 104  CO2 28 27 23   GLUCOSE 135* 147* 179*  BUN 20 18 24*  CREATININE 0.71 0.65 0.79  CALCIUM 8.6 8.8* 8.6*   Liver Function Tests: Recent Labs    07/08/17 0807 07/25/17 0800 01/30/18 0000  AST 15 15 15   ALT 9 8* 7*  ALKPHOS 69 70  --   BILITOT 1.0 0.8 0.7    PROT 6.1 6.4 6.2  ALBUMIN 3.9 4.1  --    No results for input(s): LIPASE, AMYLASE in the last 8760 hours. No results for input(s): AMMONIA in the last 8760 hours. CBC: Recent Labs    07/08/17 0807 07/25/17 0800 01/30/18 0000 02/09/18 1656 02/11/18 0253  WBC 5.5 6.7 6.7 6.8 8.1  NEUTROABS 3,190 4,154  --   --  5.9  HGB 13.0* 13.5 13.0* 12.9* 11.9*  HCT 40.2 40.5 37.3* 39.1 35.6*  MCV 100.8* 100.0 97.4 100.8* 99.7  PLT 311 292 333 287 269   Cardiac Enzymes: No results for input(s): CKTOTAL, CKMB, CKMBINDEX, TROPONINI in the last 8760 hours. BNP: Invalid input(s): POCBNP Lab Results  Component Value Date   HGBA1C 7.3 (H) 01/30/2018   Lab Results  Component Value Date   TSH 2.67 01/30/2018   Lab Results  Component Value Date   VITAMINB12 289 02/03/2017   No results found for: FOLATE No results found for: IRON, TIBC, FERRITIN  Lipid Panel: Recent Labs    07/25/17 0800 01/30/18 0000  CHOL 126 132  HDL 59 54  LDLCALC 51  --   TRIG 78 106  CHOLHDL 2.1 2.4   Lab Results  Component Value Date   HGBA1C 7.3 (H) 01/30/2018    Procedures since last visit: Ct Abdomen Pelvis W Contrast  Result Date: 02/09/2018 CLINICAL DATA:  Hematuria EXAM: CT ABDOMEN AND PELVIS WITH CONTRAST TECHNIQUE: Multidetector CT imaging of the abdomen and pelvis was performed using the standard protocol following bolus administration of intravenous contrast. CONTRAST:  172mL ISOVUE-300 IOPAMIDOL (ISOVUE-300) INJECTION 61% COMPARISON:  09/06/2016 FINDINGS: Lower chest: Lung bases demonstrate no acute consolidation or pleural effusion. Heart size within normal limits Hepatobiliary: Stable cyst in the central liver. No calcified gallstones or biliary dilatation Pancreas: Unremarkable. No pancreatic ductal dilatation or surrounding inflammatory changes. Spleen: Normal in size without focal abnormality. Adrenals/Urinary Tract: Adrenal glands are normal. Small cysts within the kidneys. Subcentimeter  lesions too small to further characterize. No hydronephrosis. Irregular bladder wall thickening with cystic changes of the anterior bladder wall. Stomach/Bowel: Stomach  is within normal limits. Appendix not well seen but no right lower quadrant inflammation. No evidence of bowel wall thickening, distention, or inflammatory changes. Vascular/Lymphatic: Marked aortic atherosclerosis. No aneurysmal dilatation. No significantly enlarged lymph nodes. Reproductive: Enlarged prostate gland with mass effect on the bladder. Other: Negative for free air or free fluid. Small fat in the right inguinal canal Musculoskeletal: Degenerative changes of the spine. No acute or suspicious bone lesion. IMPRESSION: 1. Prostatomegaly with mass effect on the posterior bladder. Irregular wall thickening and trabeculation of the bladder similar to the previous CT, this could be due to chronic obstruction (favored) or possibly cystitis. 2. There are no other acute abnormalities visualized. Electronically Signed   By: Donavan Foil M.D.   On: 02/09/2018 23:22    Assessment/Plan  1. Gross hematuria Persists. Pending urology follow up. CT scan shows of thickening of bladder wall and prostate enlargement. Currently being treated for acute cystitis. Post void residual was 94 ml when checked in ED and there was no concern for retention at that point. Advised to contact office for difficulty urination, dizziness, fever.  - Ambulatory referral to Urology  2. Acute cystitis with hematuria Continue and complete course of keflex qid until 02/17/18. Has pending urology appointment on 02/17/18 Urine culture from facility reviewed showing aerococcus viridans. No susceptibility reported. Afebrile. No CVA tenderness. No suprapubic tenderness. Continue current regimen.    Labs/tests ordered:  none  Next appointment: 2 weeks   Communication: reviewed care plan with patient and his daughter. Reviewed notes from ED visit 2/21 and  2/23.    Blanchie Serve, MD Internal Medicine [facility] [address] [place],  [address] Cell Phone (Monday-Friday 8 am - 5 pm): [phone] On Call: [phone] and follow prompts after 5 pm and on weekends Office Phone: [phone] Office Fax: [phone]

## 2018-02-13 NOTE — Patient Instructions (Signed)
    If you are unable to urinate or develop fever let our office know right away.   Hematuria, Adult Hematuria is blood in your urine. It can be caused by a bladder infection, kidney infection, prostate infection, kidney stone, or cancer of your urinary tract. Infections can usually be treated with medicine, and a kidney stone usually will pass through your urine. If neither of these is the cause of your hematuria, further workup to find out the reason may be needed. It is very important that you tell your health care provider about any blood you see in your urine, even if the blood stops without treatment or happens without causing pain. Blood in your urine that happens and then stops and then happens again can be a symptom of a very serious condition. Also, pain is not a symptom in the initial stages of many urinary cancers. Follow these instructions at home:  Drink lots of fluid, 3-4 quarts a day. If you have been diagnosed with an infection, cranberry juice is especially recommended, in addition to large amounts of water.  Avoid caffeine, tea, and carbonated beverages because they tend to irritate the bladder.  Avoid alcohol because it may irritate the prostate.  Take all medicines as directed by your health care provider.  If you were prescribed an antibiotic medicine, finish it all even if you start to feel better.  If you have been diagnosed with a kidney stone, follow your health care provider's instructions regarding straining your urine to catch the stone.  Empty your bladder often. Avoid holding urine for long periods of time.  After a bowel movement, women should cleanse front to back. Use each tissue only once.  Empty your bladder before and after sexual intercourse if you are a male. Contact a health care provider if:  You develop back pain.  You have a fever.  You have a feeling of sickness in your stomach (nausea) or vomiting.  Your symptoms are not better in 3  days. Return sooner if you are getting worse. Get help right away if:  You develop severe vomiting and are unable to keep the medicine down.  You develop severe back or abdominal pain despite taking your medicines.  You begin passing a large amount of blood or clots in your urine.  You feel extremely weak or faint, or you pass out. This information is not intended to replace advice given to you by your health care provider. Make sure you discuss any questions you have with your health care provider. Document Released: 12/06/2005 Document Revised: 05/13/2016 Document Reviewed: 08/06/2013 Elsevier Interactive Patient Education  2017 [employer].

## 2018-02-13 NOTE — Telephone Encounter (Signed)
Spoke with the patient's daughter and she agreed to have him come to the clinic today. Appointment has been scheduled for today 3:30 pm.

## 2018-02-14 ENCOUNTER — Telehealth: Payer: Self-pay

## 2018-02-14 NOTE — Telephone Encounter (Signed)
Spoke with the patients daughter and she is aware of his new lab appointment for Thursday 02/16/18 at 8:15 am. She stated that she would make sure that he is there for his lab appointment.

## 2018-02-15 ENCOUNTER — Other Ambulatory Visit: Payer: Self-pay

## 2018-02-15 ENCOUNTER — Other Ambulatory Visit: Payer: Self-pay | Admitting: *Deleted

## 2018-02-15 DIAGNOSIS — N3001 Acute cystitis with hematuria: Secondary | ICD-10-CM

## 2018-02-15 DIAGNOSIS — R310 Gross hematuria: Secondary | ICD-10-CM

## 2018-02-16 DIAGNOSIS — R310 Gross hematuria: Secondary | ICD-10-CM | POA: Diagnosis not present

## 2018-02-16 DIAGNOSIS — N3001 Acute cystitis with hematuria: Secondary | ICD-10-CM | POA: Diagnosis not present

## 2018-02-16 LAB — CBC WITH DIFFERENTIAL/PLATELET
BASOS ABS: 77 {cells}/uL (ref 0–200)
BASOS PCT: 0.9 %
EOS ABS: 301 {cells}/uL (ref 15–500)
EOS PCT: 3.5 %
HEMATOCRIT: 32.3 % — AB (ref 38.5–50.0)
Hemoglobin: 10.9 g/dL — ABNORMAL LOW (ref 13.2–17.1)
LYMPHS ABS: 1333 {cells}/uL (ref 850–3900)
MCH: 33.1 pg — AB (ref 27.0–33.0)
MCHC: 33.7 g/dL (ref 32.0–36.0)
MCV: 98.2 fL (ref 80.0–100.0)
MPV: 9.3 fL (ref 7.5–12.5)
Monocytes Relative: 11.3 %
NEUTROS ABS: 5917 {cells}/uL (ref 1500–7800)
Neutrophils Relative %: 68.8 %
Platelets: 378 10*3/uL (ref 140–400)
RBC: 3.29 10*6/uL — ABNORMAL LOW (ref 4.20–5.80)
RDW: 16.1 % — ABNORMAL HIGH (ref 11.0–15.0)
Total Lymphocyte: 15.5 %
WBC mixed population: 972 cells/uL — ABNORMAL HIGH (ref 200–950)
WBC: 8.6 10*3/uL (ref 3.8–10.8)

## 2018-02-17 DIAGNOSIS — N401 Enlarged prostate with lower urinary tract symptoms: Secondary | ICD-10-CM | POA: Diagnosis not present

## 2018-02-17 DIAGNOSIS — R310 Gross hematuria: Secondary | ICD-10-CM | POA: Diagnosis not present

## 2018-02-17 DIAGNOSIS — R350 Frequency of micturition: Secondary | ICD-10-CM | POA: Diagnosis not present

## 2018-02-22 ENCOUNTER — Non-Acute Institutional Stay: Payer: Medicare Other | Admitting: Internal Medicine

## 2018-02-22 ENCOUNTER — Encounter: Payer: Self-pay | Admitting: Internal Medicine

## 2018-02-22 VITALS — Temp 97.6°F | Resp 16 | Ht 66.0 in | Wt 153.4 lb

## 2018-02-22 DIAGNOSIS — R42 Dizziness and giddiness: Secondary | ICD-10-CM

## 2018-02-22 DIAGNOSIS — I951 Orthostatic hypotension: Secondary | ICD-10-CM

## 2018-02-22 DIAGNOSIS — D500 Iron deficiency anemia secondary to blood loss (chronic): Secondary | ICD-10-CM | POA: Diagnosis not present

## 2018-02-22 MED ORDER — MECLIZINE HCL 25 MG PO TABS: 25.0000 mg | ORAL_TABLET | Freq: Three times a day (TID) | ORAL | 3 refills | Status: DC

## 2018-02-22 NOTE — Patient Instructions (Addendum)
Please keep your self hydrated. Wear compression stockings in the morning and remove them at bedtime. Take meclizine three times a day instead of two times a day.    Orthostatic Hypotension Orthostatic hypotension is a sudden drop in blood pressure that happens when you quickly change positions, such as when you get up from a seated or lying position. Blood pressure is a measurement of how strongly, or weakly, your blood is pressing against the walls of your arteries. Arteries are blood vessels that carry blood from your heart throughout your body. When blood pressure is too low, you may not get enough blood to your brain or to the rest of your organs. This can cause weakness, light-headedness, rapid heartbeat, and fainting. This can last for just a few seconds or for up to a few minutes. Orthostatic hypotension is usually not a serious problem. However, if it happens frequently or gets worse, it may be a sign of something more serious. What are the causes? This condition may be caused by:  Sudden changes in posture, such as standing up quickly after you have been sitting or lying down.  Blood loss.  Loss of body fluids (dehydration).  Heart problems.  Hormone (endocrine) problems.  Pregnancy.  Severe infection.  Lack of certain nutrients.  Severe allergic reactions (anaphylaxis).  Certain medicines, such as blood pressure medicine or medicines that make the body lose excess fluids (diuretics). Sometimes, this condition can be caused by not taking medicine as directed, such as taking too much of a certain medicine.  What increases the risk? Certain factors can make you more likely to develop orthostatic hypotension, including:  Age. Risk increases as you get older.  Conditions that affect the heart or the central nervous system.  Taking certain medicines, such as blood pressure medicine or diuretics.  Being pregnant.  What are the signs or symptoms? Symptoms of this  condition may include:  Weakness.  Light-headedness.  Dizziness.  Blurred vision.  Fatigue.  Rapid heartbeat.  Fainting, in severe cases.  How is this diagnosed? This condition is diagnosed based on:  Your medical history.  Your symptoms.  Your blood pressure measurement. Your health care provider will check your blood pressure when you are: ? Lying down. ? Sitting. ? Standing.  A blood pressure reading is recorded as two numbers, such as "120 over 80" (or 120/80). The first ("top") number is called the systolic pressure. It is a measure of the pressure in your arteries as your heart beats. The second ("bottom") number is called the diastolic pressure. It is a measure of the pressure in your arteries when your heart relaxes between beats. Blood pressure is measured in a unit called mm Hg. Healthy blood pressure for adults is 120/80. If your blood pressure is below 90/60, you may be diagnosed with hypotension. Other information or tests that may be used to diagnose orthostatic hypotension include:  Your other vital signs, such as your heart rate and temperature.  Blood tests.  Tilt table test. For this test, you will be safely secured to a table that moves you from a lying position to an upright position. Your heart rhythm and blood pressure will be monitored during the test.  How is this treated? Treatment for this condition may include:  Changing your diet. This may involve eating more salt (sodium) or drinking more water.  Taking medicines to raise your blood pressure.  Changing the dosage of certain medicines you are taking that might be lowering your blood pressure.  Wearing compression stockings. These stockings help to prevent blood clots and reduce swelling in your legs.  In some cases, you may need to go to the hospital for:  Fluid replacement. This means you will receive fluids through an IV tube.  Blood replacement. This means you will receive donated  blood through an IV tube (transfusion).  Treating an infection or heart problems, if this applies.  Monitoring. You may need to be monitored while medicines that you are taking wear off.  Follow these instructions at home: Eating and drinking   Drink enough fluid to keep your urine clear or pale yellow.  Eat a healthy diet and follow instructions from your health care provider about eating or drinking restrictions. A healthy diet includes: ? Fresh fruits and vegetables. ? Whole grains. ? Lean meats. ? Low-fat dairy products.  Eat extra salt only as directed. Do not add extra salt to your diet unless your health care provider told you to do that.  Eat frequent, small meals.  Avoid standing up suddenly after eating. Medicines  Take over-the-counter and prescription medicines only as told by your health care provider. ? Follow instructions from your health care provider about changing the dosage of your current medicines, if this applies. ? Do not stop or adjust any of your medicines on your own. General instructions  Wear compression stockings as told by your health care provider.  Get up slowly from lying down or sitting positions. This gives your blood pressure a chance to adjust.  Avoid hot showers and excessive heat as directed by your health care provider.  Return to your normal activities as told by your health care provider. Ask your health care provider what activities are safe for you.  Do not use any products that contain nicotine or tobacco, such as cigarettes and e-cigarettes. If you need help quitting, ask your health care provider.  Keep all follow-up visits as told by your health care provider. This is important. Contact a health care provider if:  You vomit.  You have diarrhea.  You have a fever for more than 2-3 days.  You feel more thirsty than usual.  You feel weak and tired. Get help right away if:  You have chest pain.  You have a fast or  irregular heartbeat.  You develop numbness in any part of your body.  You cannot move your arms or your legs.  You have trouble speaking.  You become sweaty or feel lightheaded.  You faint.  You feel short of breath.  You have trouble staying awake.  You feel confused. This information is not intended to replace advice given to you by your health care provider. Make sure you discuss any questions you have with your health care provider. Document Released: 11/26/2002 Document Revised: 08/24/2016 Document Reviewed: 05/28/2016 Elsevier Interactive Patient Education  2018 [employer].

## 2018-02-22 NOTE — Progress Notes (Signed)
[facility]  Provider: Blanchie Serve MD   Location:      Place of Service:     PCP: Blanchie Serve, MD Patient Care Team: Blanchie Serve, MD as PCP - General (Internal Medicine) Clent Jacks, MD as Consulting Physician (Ophthalmology) Lindwood Coke, MD as Consulting Physician (Dermatology) Thornell Sartorius, MD as Consulting Physician (Otolaryngology) Richmond Campbell, MD as Consulting Physician (Gastroenterology) Melina Modena, [facility]  Extended Emergency Contact Information Primary Emergency Contact: Moody,Peggy Address: [id number]          [place] [address] [place] Phone: [phone] Relation: Spouse Secondary Emergency Contact: Amandeep, [place] Phone: [phone] Mobile Phone: [phone] Relation: Daughter  Code Status: DNR  Goals of Care: Advanced Directive information Advanced Directives 02/11/2018  Does Patient Have a Medical Advance Directive? Yes  Type of Advance Directive Out of facility DNR (pink MOST or yellow form)  Does patient want to make changes to medical advance directive? -  Copy of [place] in Chart? -  Would patient like information on creating a medical advance directive? -      Chief Complaint  Patient presents with  . Acute Visit    dizziness  . Medication Refill    No refills needed at this time    HPI: Patient is a 84 y.o. male seen today for acute visit for dizziness. He has been dizzy with change of position and sometimes even with rest. Denies tinnitus. Taking meclizine 25 mg bid at present with some help. No fall reported.   Past Medical History:  Diagnosis Date  . Abdominal pain, other specified site   . Allergic rhinitis due to pollen   . Benign paroxysmal positional vertigo   . Disturbance of skin sensation    left great toe  . Elevated prostate specific antigen (PSA)   . Hematuria, unspecified   . Hypertrophy of prostate without  urinary obstruction and other lower urinary tract symptoms (LUTS)   . Impotence of organic origin   . Left knee pain 05/28/2014  . Macrocytosis   . Other and unspecified hyperlipidemia   . Other malaise and fatigue   . Spermatocele    bilateral  . Type II or unspecified type diabetes mellitus without mention of complication, uncontrolled   . Unspecified essential hypertension   . Unspecified glaucoma(365.9)    Past Surgical History:  Procedure Laterality Date  . CATARACT EXTRACTION EXTRACAPSULAR  2008   bilateraly Dr. Katy Fitch  . COLONOSCOPY  01/03/2002   normal Dr. Earlean Shawl  . PROSTATE BIOPSY  1995   due to elevated PSA normal  . TONSILLECTOMY      reports that he quit smoking about 69 years ago. He quit after 4.00 years of use. he has never used smokeless tobacco. He reports that he does not drink alcohol or use drugs. Social History   Socioeconomic History  . Marital status: Married    Spouse name: Not on file  . Number of children: Not on file  . Years of education: Not on file  . Highest education level: Not on file  Social Needs  . Financial resource strain: Not on file  . Food insecurity - worry: Not on file  . Food insecurity - inability: Not on file  . Transportation needs - medical: Not on file  . Transportation needs - non-medical: Not on file  Occupational History  . Occupation: retired, self employed  Tobacco Use  . Smoking status: Former Smoker  Years: 4.00    Last attempt to quit: 04/09/1948    Years since quitting: 69.9  . Smokeless tobacco: Never Used  Substance and Sexual Activity  . Alcohol use: No  . Drug use: No  . Sexual activity: Yes  Other Topics Concern  . Not on file  Social History Narrative   [facility]    Married    Living Will    Functional Status Survey:    Family History  Problem Relation Age of Onset  . Cancer Father        lung  . Cancer Brother        adrenal gland  . Cancer Son        liver    Health  Maintenance  Topic Date Due  . Samul Dada  10/07/1943  . OPHTHALMOLOGY EXAM  12/31/2016  . URINE MICROALBUMIN  07/25/2018  . HEMOGLOBIN A1C  07/30/2018  . FOOT EXAM  11/09/2018  . INFLUENZA VACCINE  Completed  . PNA vac Low Risk Adult  Completed    Allergies  Allergen Reactions  . Metformin And Related Other (See Comments)    unknown  . Sulfa Antibiotics Swelling  . Tetanus Toxoids Other (See Comments)    unknown  . Typhoid Vaccines Other (See Comments)    unknown    Outpatient Encounter Medications as of 02/22/2018  Medication Sig  . aspirin 81 MG tablet Take 81 mg by mouth daily.  Marland Kitchen JANUVIA 100 MG tablet TAKE 1 TABLET BY MOUTH EVERY MORNING TO CONTROL DIABETES  . meclizine (ANTIVERT) 25 MG tablet Take 1 tablet (25 mg total) by mouth 3 (three) times daily.  . pioglitazone (ACTOS) 45 MG tablet TAKE 1 TABLET BY MOUTH EVERY DAY TO CONTROL BLOOD SUGAR  . Selenium 200 MCG TABS Take 1 tablet by mouth daily.   . vitamin B-12 (CYANOCOBALAMIN) 1000 MCG tablet Take 1 tablet (1,000 mcg total) by mouth daily.  . [DISCONTINUED] meclizine (ANTIVERT) 25 MG tablet Take 1 tablet (25 mg total) by mouth 2 (two) times daily.  Marland Kitchen glucose blood test strip Use one strip to test blood sugar once a day before breakfast . Dx E11.9 (Patient not taking: Reported on 02/22/2018)  . MICROLET LANCETS MISC Use one lancet each morning to test blood sugar. Dx. E11.9 (Patient not taking: Reported on 02/22/2018)   No facility-administered encounter medications on file as of 02/22/2018.     Review of Systems  Constitutional: Negative for appetite change, chills and fever.       Keeping himself hydrated  HENT: Negative for mouth sores, sinus pressure and trouble swallowing.   Eyes: Positive for visual disturbance.  Respiratory: Negative for cough and shortness of breath.   Cardiovascular: Negative for chest pain and palpitations.  Gastrointestinal: Negative for abdominal pain, blood in stool, diarrhea, nausea and  vomiting.  Genitourinary: Negative for dysuria and hematuria.       No further hematuria  Neurological: Positive for dizziness and light-headedness. Negative for syncope, numbness and headaches.  Psychiatric/Behavioral: Negative for confusion.    Vitals:   02/22/18 1051  Resp: 16  Temp: 97.6 F (36.4 C)  TempSrc: Oral  SpO2: 96%  Weight: 153 lb 6.4 oz (69.6 kg)  Height: '5\' 6"'$  (1.676 m)   Body mass index is 24.76 kg/m.   Lying down- 126/60, 78/min: sitting up 118/62, 79/min; standing 98/60, 84/min  Physical Exam  Constitutional: He is oriented to person, place, and time. He appears well-developed and well-nourished. No distress.  HENT:  Head: Normocephalic and atraumatic.  Mouth/Throat: Oropharynx is clear and moist. No oropharyngeal exudate.  Eyes: Conjunctivae and EOM are normal. Pupils are equal, round, and reactive to light.  No nystagmus  Neck: Normal range of motion. Neck supple.  Cardiovascular: Normal rate and regular rhythm.  Pulmonary/Chest: Effort normal and breath sounds normal. He has no wheezes. He has no rales.  Abdominal: Soft. Bowel sounds are normal. There is no tenderness. There is no rebound.  Musculoskeletal: He exhibits no edema.  Lymphadenopathy:    He has no cervical adenopathy.  Neurological: He is alert and oriented to person, place, and time.  Skin: Skin is warm and dry. He is not diaphoretic.  Psychiatric: He has a normal mood and affect.    Labs reviewed: Basic Metabolic Panel: Recent Labs    01/30/18 0000 02/09/18 1656 02/11/18 0253  NA 141 139 137  K 4.4 4.2 4.2  CL 105 104 104  CO2 '28 27 23  '$ GLUCOSE 135* 147* 179*  BUN 20 18 24*  CREATININE 0.71 0.65 0.79  CALCIUM 8.6 8.8* 8.6*   Liver Function Tests: Recent Labs    07/08/17 0807 07/25/17 0800 01/30/18 0000  AST '15 15 15  '$ ALT 9 8* 7*  ALKPHOS 69 70  --   BILITOT 1.0 0.8 0.7  PROT 6.1 6.4 6.2  ALBUMIN 3.9 4.1  --    No results for input(s): LIPASE, AMYLASE in the last  8760 hours. No results for input(s): AMMONIA in the last 8760 hours. CBC: Recent Labs    07/25/17 0800  02/09/18 1656 02/11/18 0253 02/16/18 0000  WBC 6.7   < > 6.8 8.1 8.6  NEUTROABS 4,154  --   --  5.9 5,917  HGB 13.5   < > 12.9* 11.9* 10.9*  HCT 40.5   < > 39.1 35.6* 32.3*  MCV 100.0   < > 100.8* 99.7 98.2  PLT 292   < > 287 269 378   < > = values in this interval not displayed.   Cardiac Enzymes: No results for input(s): CKTOTAL, CKMB, CKMBINDEX, TROPONINI in the last 8760 hours. BNP: Invalid input(s): POCBNP Lab Results  Component Value Date   HGBA1C 7.3 (H) 01/30/2018   Lab Results  Component Value Date   TSH 2.67 01/30/2018   Lab Results  Component Value Date   VITAMINB12 289 02/03/2017   No results found for: FOLATE No results found for: IRON, TIBC, FERRITIN  Lipid Panel: Recent Labs    07/25/17 0800 01/30/18 0000  CHOL 126 132  HDL 59 54  LDLCALC 51  --   TRIG 78 106  CHOLHDL 2.1 2.4   Lab Results  Component Value Date   HGBA1C 7.3 (H) 01/30/2018    Procedures since last visit: Ct Abdomen Pelvis W Contrast  Result Date: 02/09/2018 CLINICAL DATA:  Hematuria EXAM: CT ABDOMEN AND PELVIS WITH CONTRAST TECHNIQUE: Multidetector CT imaging of the abdomen and pelvis was performed using the standard protocol following bolus administration of intravenous contrast. CONTRAST:  181m ISOVUE-300 IOPAMIDOL (ISOVUE-300) INJECTION 61% COMPARISON:  09/06/2016 FINDINGS: Lower chest: Lung bases demonstrate no acute consolidation or pleural effusion. Heart size within normal limits Hepatobiliary: Stable cyst in the central liver. No calcified gallstones or biliary dilatation Pancreas: Unremarkable. No pancreatic ductal dilatation or surrounding inflammatory changes. Spleen: Normal in size without focal abnormality. Adrenals/Urinary Tract: Adrenal glands are normal. Small cysts within the kidneys. Subcentimeter lesions too small to further characterize. No hydronephrosis.  Irregular bladder wall thickening  with cystic changes of the anterior bladder wall. Stomach/Bowel: Stomach is within normal limits. Appendix not well seen but no right lower quadrant inflammation. No evidence of bowel wall thickening, distention, or inflammatory changes. Vascular/Lymphatic: Marked aortic atherosclerosis. No aneurysmal dilatation. No significantly enlarged lymph nodes. Reproductive: Enlarged prostate gland with mass effect on the bladder. Other: Negative for free air or free fluid. Small fat in the right inguinal canal Musculoskeletal: Degenerative changes of the spine. No acute or suspicious bone lesion. IMPRESSION: 1. Prostatomegaly with mass effect on the posterior bladder. Irregular wall thickening and trabeculation of the bladder similar to the previous CT, this could be due to chronic obstruction (favored) or possibly cystitis. 2. There are no other acute abnormalities visualized. Electronically Signed   By: Donavan Foil M.D.   On: 02/09/2018 23:22    Assessment/Plan  1. Orthostatic hypotension orthostasis noted on exam. Has symptom of dizziness. Slow position change, fall precautions, compression stocking for now- script provided. Increased dosing of meclizine. Labs as below for further evaluation  - CMP with eGFR([facility]); Future - CBC (no diff); Future - Cortisol; Future  2. Anemia due to blood loss Was having hematuria in last 2 weeks. No further hematuria. Check cbc with worsening dizziness and low Hb in past to rule out worsening of anemia.  - CMP with eGFR([facility]); Future - CBC (no diff); Future  3. Vertigo Increase meclizine as below. - meclizine (ANTIVERT) 25 MG tablet; Take 1 tablet (25 mg total) by mouth 3 (three) times daily.  Dispense: 90 tablet; Refill: 3    Labs/tests ordered:  As above  Next appointment: in 1 week.   Communication: reviewed care plan with patient     Blanchie Serve, MD Internal Medicine [place], [place] [address] Cell Phone (Monday-Friday 8 am - 5 pm): [phone] On Call: [phone] and follow prompts after 5 pm and on weekends Office Phone: [phone] Office Fax: [phone]

## 2018-02-23 DIAGNOSIS — I951 Orthostatic hypotension: Secondary | ICD-10-CM

## 2018-02-23 DIAGNOSIS — D500 Iron deficiency anemia secondary to blood loss (chronic): Secondary | ICD-10-CM

## 2018-02-23 LAB — CBC
HEMATOCRIT: 33.1 % — AB (ref 38.5–50.0)
HEMOGLOBIN: 11.2 g/dL — AB (ref 13.2–17.1)
MCH: 32.7 pg (ref 27.0–33.0)
MCHC: 33.8 g/dL (ref 32.0–36.0)
MCV: 96.8 fL (ref 80.0–100.0)
MPV: 9.1 fL (ref 7.5–12.5)
Platelets: 407 10*3/uL — ABNORMAL HIGH (ref 140–400)
RBC: 3.42 10*6/uL — ABNORMAL LOW (ref 4.20–5.80)
RDW: 16.4 % — ABNORMAL HIGH (ref 11.0–15.0)
WBC: 6.2 10*3/uL (ref 3.8–10.8)

## 2018-02-23 LAB — COMPLETE METABOLIC PANEL WITH GFR
AG Ratio: 1.6 (calc) (ref 1.0–2.5)
ALBUMIN MSPROF: 3.8 g/dL (ref 3.6–5.1)
ALT: 7 U/L — ABNORMAL LOW (ref 9–46)
AST: 15 U/L (ref 10–35)
Alkaline phosphatase (APISO): 76 U/L (ref 40–115)
BUN: 14 mg/dL (ref 7–25)
CALCIUM: 8.6 mg/dL (ref 8.6–10.3)
CO2: 28 mmol/L (ref 20–32)
CREATININE: 0.75 mg/dL (ref 0.70–1.11)
Chloride: 104 mmol/L (ref 98–110)
GFR, EST NON AFRICAN AMERICAN: 80 mL/min/{1.73_m2} (ref >=60)
GFR, Est African American: 93 mL/min/{1.73_m2} (ref >=60)
GLOBULIN: 2.4 g/dL (ref 1.9–3.7)
Glucose, Bld: 145 mg/dL — ABNORMAL HIGH (ref 65–99)
Potassium: 4.2 mmol/L (ref 3.5–5.3)
SODIUM: 140 mmol/L (ref 135–146)
Total Bilirubin: 0.5 mg/dL (ref 0.2–1.2)
Total Protein: 6.2 g/dL (ref 6.1–8.1)

## 2018-02-23 LAB — CORTISOL: Cortisol, Plasma: 13.1 ug/dL

## 2018-02-25 IMAGING — CR DG CHEST 2V
2 series · 2 of 2 positions shown · non-contrast
Comparison: None

CLINICAL DATA: Productive cough for 1 month, fever, congestion,
type II diabetes mellitus, essential hypertension, former smoker

EXAM:
CHEST  2 VIEW

[w chest pa]
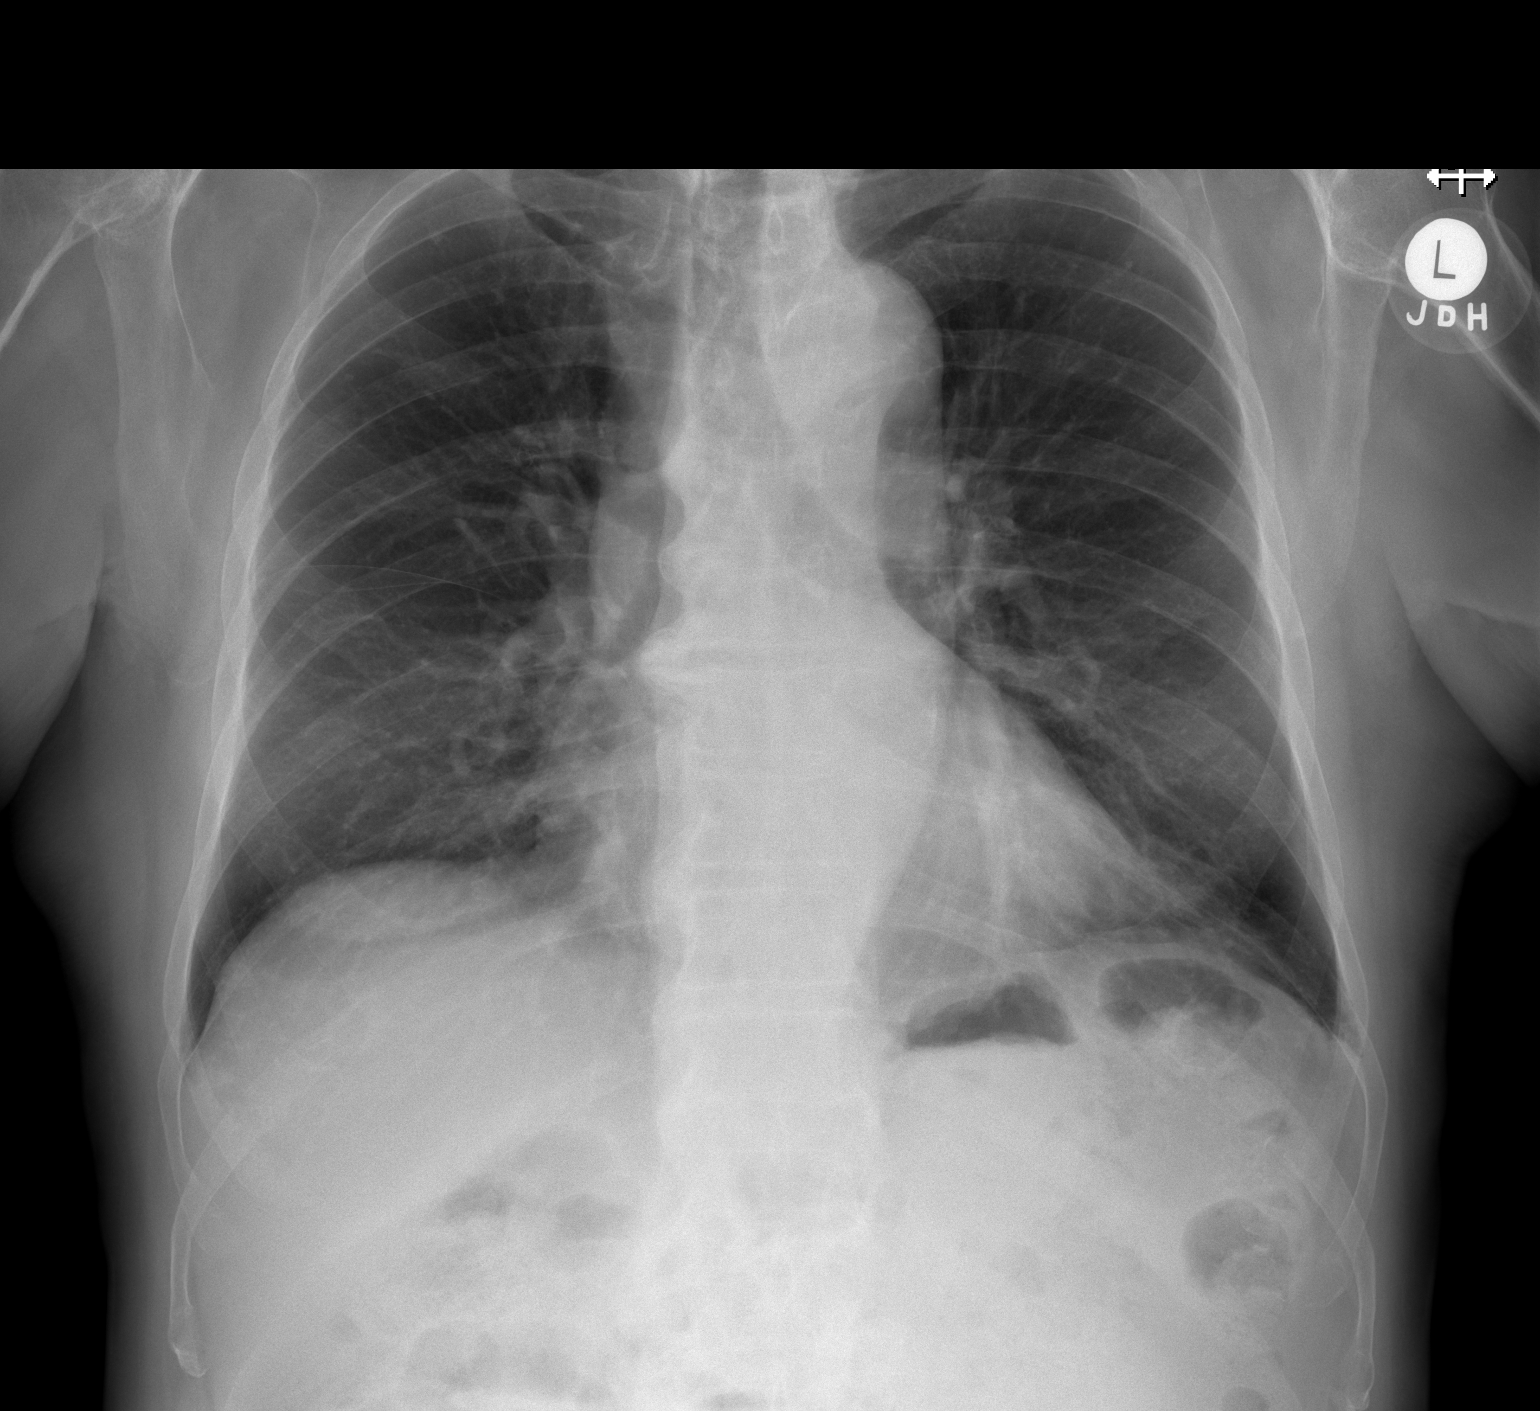

[w chest lat]
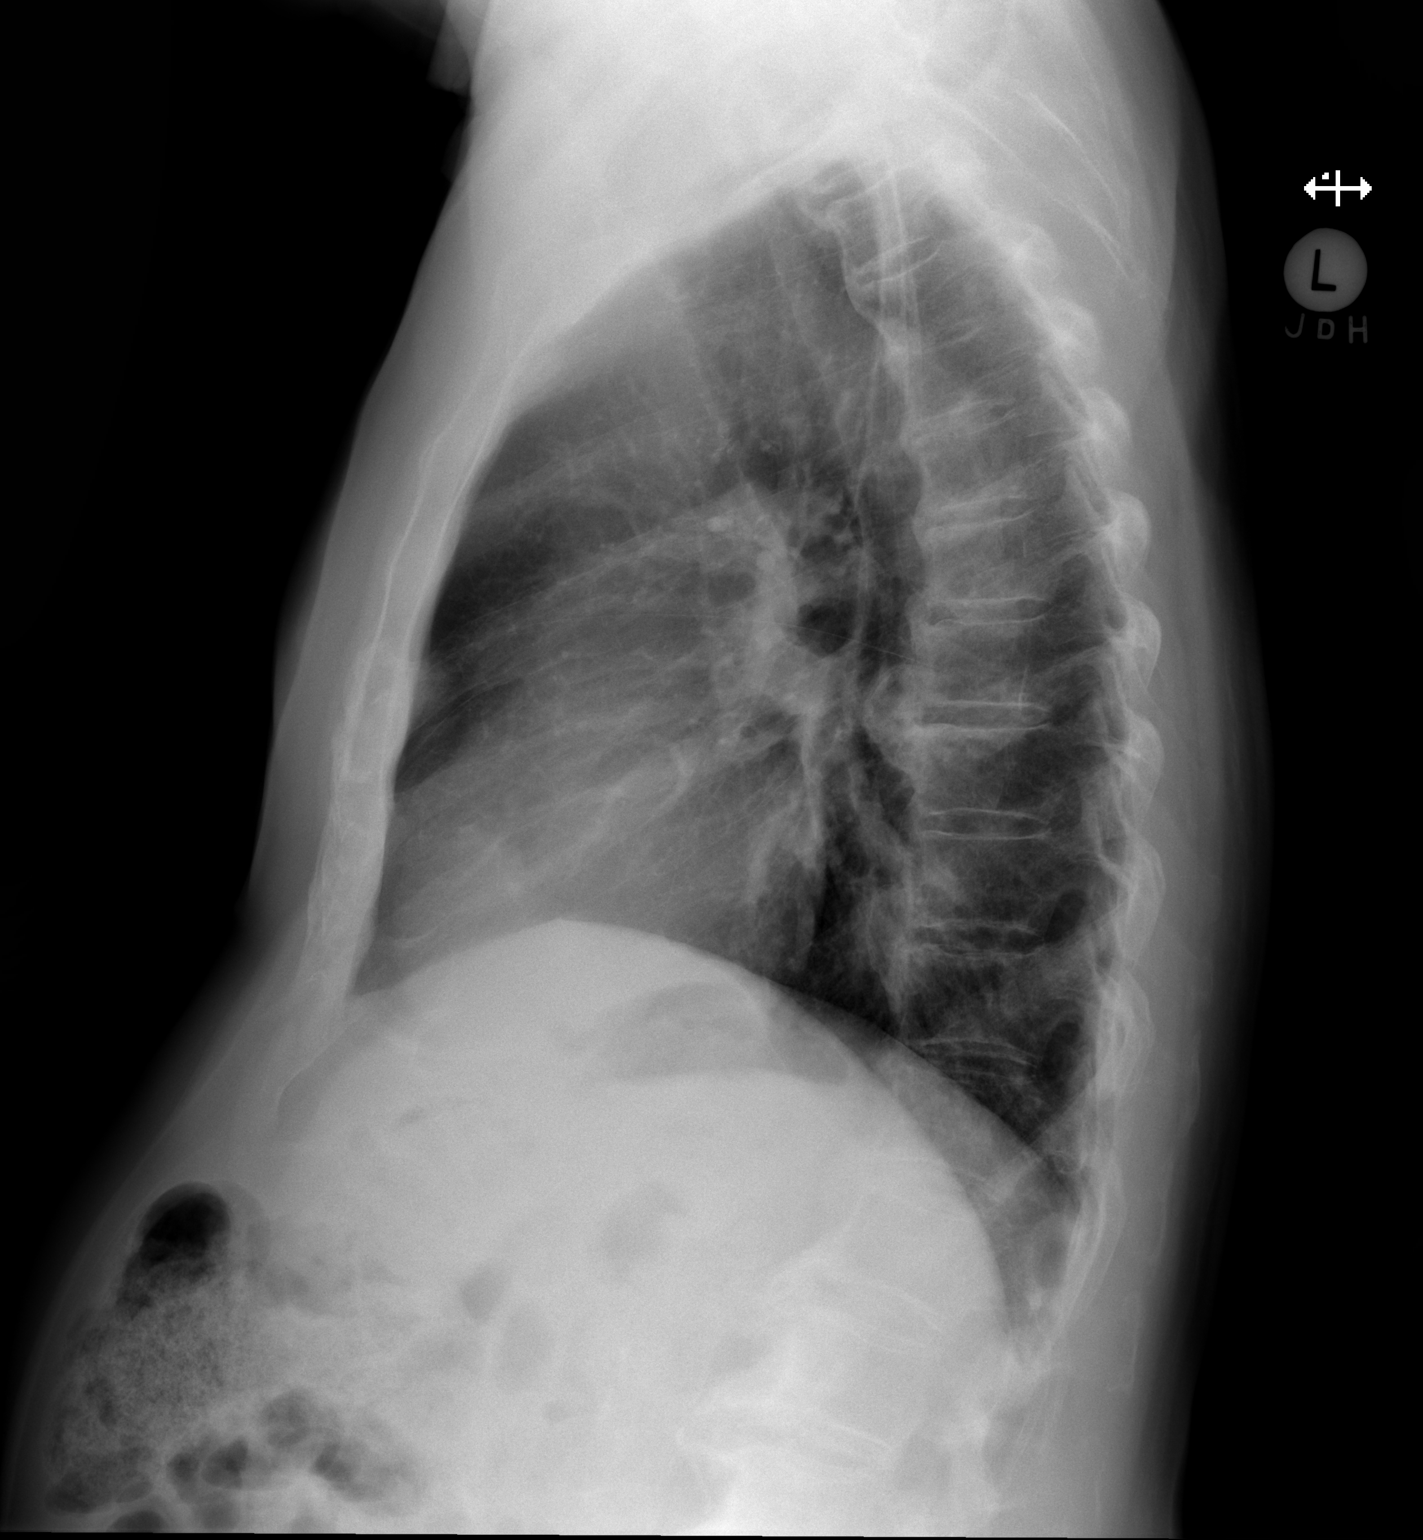

[2 of 2 positions shown; findings below may reference images not displayed]

FINDINGS: Upper normal heart size.

Tortuous aorta with atherosclerotic calcification.

Mediastinal contours and pulmonary vascularity normal.

Lungs clear.

No pleural effusion or pneumothorax.

Diffuse osseous demineralization with scattered endplate spur
formation thoracic spine.
IMPRESSION: No acute abnormalities.

Aortic Atherosclerosis ([id number]).

## 2018-03-01 ENCOUNTER — Non-Acute Institutional Stay: Payer: Medicare Other | Admitting: Internal Medicine

## 2018-03-01 ENCOUNTER — Encounter: Payer: Self-pay | Admitting: Internal Medicine

## 2018-03-01 VITALS — BP 116/60 | HR 73 | Temp 97.7°F | Resp 16 | Ht 66.0 in | Wt 151.0 lb

## 2018-03-01 DIAGNOSIS — E782 Mixed hyperlipidemia: Secondary | ICD-10-CM

## 2018-03-01 DIAGNOSIS — I951 Orthostatic hypotension: Secondary | ICD-10-CM | POA: Diagnosis not present

## 2018-03-01 DIAGNOSIS — E118 Type 2 diabetes mellitus with unspecified complications: Secondary | ICD-10-CM | POA: Diagnosis not present

## 2018-03-01 NOTE — Patient Instructions (Signed)
  Continue to wear your compression stockings.  You will receive a call to set up appointment for ultrasound of your heart.  enjoy your trip!!

## 2018-03-01 NOTE — Progress Notes (Signed)
[facility]  Provider: Blanchie Serve MD   Location:  [place] of Service:  Clinic (12)  PCP: Blanchie Serve, MD Patient Care Team: Blanchie Serve, MD as PCP - General (Internal Medicine) Clent Jacks, MD as Consulting Physician (Ophthalmology) Lindwood Coke, MD as Consulting Physician (Dermatology) Thornell Sartorius, MD as Consulting Physician (Otolaryngology) Richmond Campbell, MD as Consulting Physician (Gastroenterology) Melina Modena, [facility]  Extended Emergency Contact Information Primary Emergency Contact: Sites,Peggy Address: [id number]          [place] [address] [place] Phone: [phone] Relation: Spouse Secondary Emergency Contact: Ryott, Rafferty [facility] Phone: [phone] Mobile Phone: [phone] Relation: Daughter  Code Status: DNR  Goals of Care: Advanced Directive information Advanced Directives 02/11/2018  Does Patient Have a Medical Advance Directive? Yes  Type of Advance Directive Out of facility DNR (pink MOST or yellow form)  Does patient want to make changes to medical advance directive? -  Copy of [place] in Chart? -  Would patient like information on creating a medical advance directive? -     Chief Complaint  Patient presents with  . Acute Visit    follow up on dizziness and orthostatic hypotension  . Medication Refill    No refills needed at this time.     HPI: Patient is a 84 y.o. male seen today for acute visit for follow up on dizziness and orthostasis. He was having hematuria before that has now resolved. He was having dizziness with change of position last week. He is currently taking meclizine 25 mg tid. He still have some dizziness with sudden change of position. No fall reported. Denies tinnitus.     Past Medical History:  Diagnosis Date  . Abdominal pain, other specified site   . Allergic rhinitis due to pollen   . Benign  paroxysmal positional vertigo   . Disturbance of skin sensation    left great toe  . Elevated prostate specific antigen (PSA)   . Hematuria, unspecified   . Hypertrophy of prostate without urinary obstruction and other lower urinary tract symptoms (LUTS)   . Impotence of organic origin   . Left knee pain 05/28/2014  . Macrocytosis   . Other and unspecified hyperlipidemia   . Other malaise and fatigue   . Spermatocele    bilateral  . Type II or unspecified type diabetes mellitus without mention of complication, uncontrolled   . Unspecified essential hypertension   . Unspecified glaucoma(365.9)    Past Surgical History:  Procedure Laterality Date  . CATARACT EXTRACTION EXTRACAPSULAR  2008   bilateraly Dr. Katy Fitch  . COLONOSCOPY  01/03/2002   normal Dr. Earlean Shawl  . PROSTATE BIOPSY  1995   due to elevated PSA normal  . TONSILLECTOMY      reports that he quit smoking about 69 years ago. He quit after 4.00 years of use. he has never used smokeless tobacco. He reports that he does not drink alcohol or use drugs. Social History   Socioeconomic History  . Marital status: Married    Spouse name: Not on file  . Number of children: Not on file  . Years of education: Not on file  . Highest education level: Not on file  Social Needs  . Financial resource strain: Not on file  . Food insecurity - worry: Not on file  . Food insecurity - inability: Not on file  . Transportation needs - medical: Not on  file  . Transportation needs - non-medical: Not on file  Occupational History  . Occupation: retired, self employed  Tobacco Use  . Smoking status: Former Smoker    Years: 4.00    Last attempt to quit: 04/09/1948    Years since quitting: 69.9  . Smokeless tobacco: Never Used  Substance and Sexual Activity  . Alcohol use: No  . Drug use: No  . Sexual activity: Yes  Other Topics Concern  . Not on file  Social History Narrative   Lives at [facility]    Married    Living Will      Family History  Problem Relation Age of Onset  . Cancer Father        lung  . Cancer Brother        adrenal gland  . Cancer Son        liver    Health Maintenance  Topic Date Due  . Samul Dada  10/07/1943  . OPHTHALMOLOGY EXAM  12/31/2016  . URINE MICROALBUMIN  07/25/2018  . HEMOGLOBIN A1C  07/30/2018  . FOOT EXAM  11/09/2018  . INFLUENZA VACCINE  Completed  . PNA vac Low Risk Adult  Completed    Allergies  Allergen Reactions  . Metformin And Related Other (See Comments)    unknown  . Sulfa Antibiotics Swelling  . Tetanus Toxoids Other (See Comments)    unknown  . Typhoid Vaccines Other (See Comments)    unknown    Outpatient Encounter Medications as of 03/01/2018  Medication Sig  . aspirin 81 MG tablet Take 81 mg by mouth daily.  Marland Kitchen JANUVIA 100 MG tablet TAKE 1 TABLET BY MOUTH EVERY MORNING TO CONTROL DIABETES  . meclizine (ANTIVERT) 25 MG tablet Take 1 tablet (25 mg total) by mouth 3 (three) times daily.  . pioglitazone (ACTOS) 45 MG tablet TAKE 1 TABLET BY MOUTH EVERY DAY TO CONTROL BLOOD SUGAR  . Selenium 200 MCG TABS Take 1 tablet by mouth daily.   . vitamin B-12 (CYANOCOBALAMIN) 1000 MCG tablet Take 1 tablet (1,000 mcg total) by mouth daily.  Marland Kitchen glucose blood test strip Use one strip to test blood sugar once a day before breakfast . Dx E11.9 (Patient not taking: Reported on 02/22/2018)  . MICROLET LANCETS MISC Use one lancet each morning to test blood sugar. Dx. E11.9 (Patient not taking: Reported on 02/22/2018)   No facility-administered encounter medications on file as of 03/01/2018.     Review of Systems  Constitutional: Negative for activity change, diaphoresis, fatigue and fever.  HENT: Negative for congestion, ear discharge, ear pain, rhinorrhea and tinnitus.   Eyes: Negative for visual disturbance.  Respiratory: Negative for shortness of breath and wheezing.   Cardiovascular: Negative for chest pain and palpitations.  Gastrointestinal: Negative for  abdominal pain, nausea and vomiting.  Genitourinary: Negative for hematuria.  Musculoskeletal: Negative for back pain and neck pain.  Skin: Negative for rash.  Neurological: Positive for dizziness. Negative for syncope and headaches.  Psychiatric/Behavioral: Negative for behavioral problems and confusion.    Vitals:   03/01/18 0857  BP: 116/60  Pulse: 73  Resp: 16  Temp: 97.7 F (36.5 C)  TempSrc: Oral  SpO2: 99%  Weight: 151 lb (68.5 kg)  Height: '5\' 6"'$  (1.676 m)   Body mass index is 24.37 kg/m.   Wt Readings from Last 3 Encounters:  03/01/18 151 lb (68.5 kg)  02/22/18 153 lb 6.4 oz (69.6 kg)  02/13/18 153 lb 12.8 oz (69.8 kg)  Lying down: 122/64, 86/min Sitting: 118/60, 82/min Standing: 108/60, 78/min  02/08/18 MMSE 29/30  Physical Exam  Constitutional: He is oriented to person, place, and time. He appears well-developed and well-nourished. No distress.  HENT:  Head: Normocephalic and atraumatic.  Mouth/Throat: Oropharynx is clear and moist. No oropharyngeal exudate.  Eyes: Conjunctivae and EOM are normal. Right eye exhibits no discharge. Left eye exhibits no discharge.  NO NYSTAGMUS  Neck: Normal range of motion. Neck supple.  Cardiovascular: Normal rate and regular rhythm.  Pulmonary/Chest: Effort normal and breath sounds normal. He has no wheezes. He has no rales.  Abdominal: Soft. Bowel sounds are normal. There is no tenderness.  Musculoskeletal: Normal range of motion. He exhibits no edema.  Ted hose in place  Lymphadenopathy:    He has no cervical adenopathy.  Neurological: He is alert and oriented to person, place, and time.  Skin: Skin is warm and dry. He is not diaphoretic.  Psychiatric: He has a normal mood and affect.    Labs reviewed: Basic Metabolic Panel: Recent Labs    02/09/18 1656 02/11/18 0253 02/23/18 0000  NA 139 137 140  K 4.2 4.2 4.2  CL 104 104 104  CO2 '27 23 28  '$ GLUCOSE 147* 179* 145*  BUN 18 24* 14  CREATININE 0.65 0.79 0.75   CALCIUM 8.8* 8.6* 8.6   Liver Function Tests: Recent Labs    07/08/17 0807 07/25/17 0800 01/30/18 0000 02/23/18 0000  AST '15 15 15 15  '$ ALT 9 8* 7* 7*  ALKPHOS 69 70  --   --   BILITOT 1.0 0.8 0.7 0.5  PROT 6.1 6.4 6.2 6.2  ALBUMIN 3.9 4.1  --   --    No results for input(s): LIPASE, AMYLASE in the last 8760 hours. No results for input(s): AMMONIA in the last 8760 hours. CBC: Recent Labs    07/25/17 0800  02/11/18 0253 02/16/18 0000 02/23/18 0000  WBC 6.7   < > 8.1 8.6 6.2  NEUTROABS 4,154  --  5.9 5,917  --   HGB 13.5   < > 11.9* 10.9* 11.2*  HCT 40.5   < > 35.6* 32.3* 33.1*  MCV 100.0   < > 99.7 98.2 96.8  PLT 292   < > 269 378 407*   < > = values in this interval not displayed.   Cardiac Enzymes: No results for input(s): CKTOTAL, CKMB, CKMBINDEX, TROPONINI in the last 8760 hours. BNP: Invalid input(s): POCBNP Lab Results  Component Value Date   HGBA1C 7.3 (H) 01/30/2018   Lab Results  Component Value Date   TSH 2.67 01/30/2018   Lab Results  Component Value Date   VITAMINB12 289 02/03/2017   No results found for: FOLATE No results found for: IRON, TIBC, FERRITIN   02/23/18 cortisol 13.1  Lipid Panel: Recent Labs    07/25/17 0800 01/30/18 0000  CHOL 126 132  HDL 59 54  LDLCALC 51 59  TRIG 78 106  CHOLHDL 2.1 2.4   Lab Results  Component Value Date   HGBA1C 7.3 (H) 01/30/2018    Procedures since last visit: Ct Abdomen Pelvis W Contrast  Result Date: 02/09/2018 CLINICAL DATA:  Hematuria EXAM: CT ABDOMEN AND PELVIS WITH CONTRAST TECHNIQUE: Multidetector CT imaging of the abdomen and pelvis was performed using the standard protocol following bolus administration of intravenous contrast. CONTRAST:  164m ISOVUE-300 IOPAMIDOL (ISOVUE-300) INJECTION 61% COMPARISON:  09/06/2016 FINDINGS: Lower chest: Lung bases demonstrate no acute consolidation or pleural effusion. Heart size within  normal limits Hepatobiliary: Stable cyst in the central liver. No  calcified gallstones or biliary dilatation Pancreas: Unremarkable. No pancreatic ductal dilatation or surrounding inflammatory changes. Spleen: Normal in size without focal abnormality. Adrenals/Urinary Tract: Adrenal glands are normal. Small cysts within the kidneys. Subcentimeter lesions too small to further characterize. No hydronephrosis. Irregular bladder wall thickening with cystic changes of the anterior bladder wall. Stomach/Bowel: Stomach is within normal limits. Appendix not well seen but no right lower quadrant inflammation. No evidence of bowel wall thickening, distention, or inflammatory changes. Vascular/Lymphatic: Marked aortic atherosclerosis. No aneurysmal dilatation. No significantly enlarged lymph nodes. Reproductive: Enlarged prostate gland with mass effect on the bladder. Other: Negative for free air or free fluid. Small fat in the right inguinal canal Musculoskeletal: Degenerative changes of the spine. No acute or suspicious bone lesion. IMPRESSION: 1. Prostatomegaly with mass effect on the posterior bladder. Irregular wall thickening and trabeculation of the bladder similar to the previous CT, this could be due to chronic obstruction (favored) or possibly cystitis. 2. There are no other acute abnormalities visualized. Electronically Signed   By: Donavan Foil M.D.   On: 02/09/2018 23:22    Assessment/Plan  1. Orthostatic hypotension Normal cortisol level. Overall low but stable h&H. No further hematuria reported. Meclizine has helped some. Obtain echocardiogram to rule out any valvular abnormalities and systolic impairment contributing to his dizziness and orthostasis. - ECHOCARDIOGRAM COMPLETE; Future - CMP with eGFR([facility]); Future  2. Type 2 diabetes mellitus with complication, without long-term current use of insulin (HCC) C/w diabetes medication.  - CBC (no diff); Future - Hemoglobin A1c; Future  3. Mixed hyperlipidemia Stable lipid panel on review.  - CMP with  eGFR([facility]); Future   Labs/tests ordered:   Lab Orders     CMP with eGFR([facility])     CBC (no diff)     Hemoglobin A1c   Next appointment: 3 months or earlier if needed  Communication: reviewed care plan with patient   Blanchie Serve, MD Internal Medicine [place], [place] [address] Cell Phone (Monday-Friday 8 am - 5 pm): [phone] On Call: [phone] and follow prompts after 5 pm and on weekends Office Phone: [phone] Office Fax: [phone]

## 2018-03-22 DIAGNOSIS — R310 Gross hematuria: Secondary | ICD-10-CM | POA: Diagnosis not present

## 2018-03-22 DIAGNOSIS — N5201 Erectile dysfunction due to arterial insufficiency: Secondary | ICD-10-CM | POA: Diagnosis not present

## 2018-03-29 ENCOUNTER — Other Ambulatory Visit: Payer: Self-pay | Admitting: Internal Medicine

## 2018-03-31 DIAGNOSIS — D2262 Melanocytic nevi of left upper limb, including shoulder: Secondary | ICD-10-CM | POA: Diagnosis not present

## 2018-03-31 DIAGNOSIS — C4441 Basal cell carcinoma of skin of scalp and neck: Secondary | ICD-10-CM | POA: Diagnosis not present

## 2018-03-31 DIAGNOSIS — Z85828 Personal history of other malignant neoplasm of skin: Secondary | ICD-10-CM | POA: Diagnosis not present

## 2018-03-31 DIAGNOSIS — D485 Neoplasm of uncertain behavior of skin: Secondary | ICD-10-CM | POA: Diagnosis not present

## 2018-03-31 DIAGNOSIS — L820 Inflamed seborrheic keratosis: Secondary | ICD-10-CM | POA: Diagnosis not present

## 2018-03-31 DIAGNOSIS — L570 Actinic keratosis: Secondary | ICD-10-CM | POA: Diagnosis not present

## 2018-04-05 ENCOUNTER — Ambulatory Visit (HOSPITAL_COMMUNITY)
Admission: RE | Admit: 2018-04-05 | Discharge: 2018-04-05 | Disposition: A | Discharge status: Home / Self Care | Payer: Medicare Other | Source: Ambulatory Visit | Attending: Internal Medicine | Admitting: Internal Medicine

## 2018-04-05 DIAGNOSIS — I081 Rheumatic disorders of both mitral and tricuspid valves: Secondary | ICD-10-CM | POA: Diagnosis not present

## 2018-04-05 DIAGNOSIS — I951 Orthostatic hypotension: Secondary | ICD-10-CM | POA: Insufficient documentation

## 2018-04-05 DIAGNOSIS — E119 Type 2 diabetes mellitus without complications: Secondary | ICD-10-CM | POA: Insufficient documentation

## 2018-04-05 DIAGNOSIS — I119 Hypertensive heart disease without heart failure: Secondary | ICD-10-CM | POA: Insufficient documentation

## 2018-04-05 NOTE — Progress Notes (Signed)
  Echocardiogram 2D Echocardiogram has been performed.  George [name] M 04/05/2018, 11:35 AM

## 2018-05-16 DIAGNOSIS — E119 Type 2 diabetes mellitus without complications: Secondary | ICD-10-CM | POA: Diagnosis not present

## 2018-05-16 DIAGNOSIS — H353131 Nonexudative age-related macular degeneration, bilateral, early dry stage: Secondary | ICD-10-CM | POA: Diagnosis not present

## 2018-05-16 DIAGNOSIS — H401131 Primary open-angle glaucoma, bilateral, mild stage: Secondary | ICD-10-CM | POA: Diagnosis not present

## 2018-05-16 DIAGNOSIS — Z961 Presence of intraocular lens: Secondary | ICD-10-CM | POA: Diagnosis not present

## 2018-05-16 LAB — HM DIABETES EYE EXAM

## 2018-05-17 ENCOUNTER — Encounter: Payer: Self-pay | Admitting: Internal Medicine

## 2018-05-22 ENCOUNTER — Other Ambulatory Visit: Payer: Self-pay | Admitting: Internal Medicine

## 2018-05-22 DIAGNOSIS — E119 Type 2 diabetes mellitus without complications: Secondary | ICD-10-CM

## 2018-05-22 DIAGNOSIS — Z794 Long term (current) use of insulin: Principal | ICD-10-CM

## 2018-05-25 DIAGNOSIS — E782 Mixed hyperlipidemia: Secondary | ICD-10-CM

## 2018-05-25 DIAGNOSIS — I951 Orthostatic hypotension: Secondary | ICD-10-CM

## 2018-05-25 DIAGNOSIS — E118 Type 2 diabetes mellitus with unspecified complications: Secondary | ICD-10-CM

## 2018-05-26 LAB — CBC
HCT: 38.2 % — ABNORMAL LOW (ref 38.5–50.0)
Hemoglobin: 13.1 g/dL — ABNORMAL LOW (ref 13.2–17.1)
MCH: 32.7 pg (ref 27.0–33.0)
MCHC: 34.3 g/dL (ref 32.0–36.0)
MCV: 95.3 fL (ref 80.0–100.0)
MPV: 9.7 fL (ref 7.5–12.5)
PLATELETS: 268 10*3/uL (ref 140–400)
RBC: 4.01 10*6/uL — ABNORMAL LOW (ref 4.20–5.80)
RDW: 16.9 % — AB (ref 11.0–15.0)
WBC: 5.4 10*3/uL (ref 3.8–10.8)

## 2018-05-26 LAB — COMPLETE METABOLIC PANEL WITH GFR
AG Ratio: 1.8 (calc) (ref 1.0–2.5)
ALT: 7 U/L — ABNORMAL LOW (ref 9–46)
AST: 15 U/L (ref 10–35)
Albumin: 4.1 g/dL (ref 3.6–5.1)
Alkaline phosphatase (APISO): 71 U/L (ref 40–115)
BUN: 17 mg/dL (ref 7–25)
CO2: 29 mmol/L (ref 20–32)
CREATININE: 0.7 mg/dL (ref 0.70–1.11)
Calcium: 8.6 mg/dL (ref 8.6–10.3)
Chloride: 107 mmol/L (ref 98–110)
GFR, Est African American: 96 mL/min/{1.73_m2} (ref >=60)
GFR, Est Non African American: 83 mL/min/{1.73_m2} (ref >=60)
GLUCOSE: 129 mg/dL — AB (ref 65–99)
Globulin: 2.3 g/dL (calc) (ref 1.9–3.7)
Potassium: 4.1 mmol/L (ref 3.5–5.3)
Sodium: 143 mmol/L (ref 135–146)
Total Bilirubin: 0.6 mg/dL (ref 0.2–1.2)
Total Protein: 6.4 g/dL (ref 6.1–8.1)

## 2018-05-26 LAB — MICROALBUMIN / CREATININE URINE RATIO
CREATININE, URINE: 60 mg/dL (ref 20–320)
MICROALB UR: 0.6 mg/dL
MICROALB/CREAT RATIO: 10 ug/mg{creat} (ref <30)

## 2018-05-26 LAB — HEMOGLOBIN A1C
EAG (MMOL/L): 8.9 (calc)
Hgb A1c MFr Bld: 7.2 % of total Hgb — ABNORMAL HIGH (ref <5.7)
MEAN PLASMA GLUCOSE: 160 (calc)

## 2018-05-31 ENCOUNTER — Non-Acute Institutional Stay: Payer: Medicare Other | Admitting: Internal Medicine

## 2018-05-31 ENCOUNTER — Encounter: Payer: Self-pay | Admitting: Internal Medicine

## 2018-05-31 VITALS — BP 124/72 | HR 72 | Temp 97.6°F | Resp 16 | Ht 66.0 in | Wt 158.2 lb

## 2018-05-31 DIAGNOSIS — E782 Mixed hyperlipidemia: Secondary | ICD-10-CM

## 2018-05-31 DIAGNOSIS — M25511 Pain in right shoulder: Secondary | ICD-10-CM | POA: Diagnosis not present

## 2018-05-31 DIAGNOSIS — R42 Dizziness and giddiness: Secondary | ICD-10-CM | POA: Diagnosis not present

## 2018-05-31 DIAGNOSIS — E118 Type 2 diabetes mellitus with unspecified complications: Secondary | ICD-10-CM | POA: Diagnosis not present

## 2018-05-31 MED ORDER — ACETAMINOPHEN 500 MG PO TABS: 500.0000 mg | ORAL_TABLET | Freq: Two times a day (BID) | ORAL | 0 refills | Status: DC

## 2018-05-31 MED ORDER — MECLIZINE HCL 25 MG PO TABS: 25.0000 mg | ORAL_TABLET | Freq: Two times a day (BID) | ORAL | 3 refills | Status: DC

## 2018-05-31 NOTE — Progress Notes (Signed)
[facility]  Provider: Blanchie Serve MD   Location:  [place] of Service:  Clinic (12)  PCP: Blanchie Serve, MD Patient Care Team: Blanchie Serve, MD as PCP - General (Internal Medicine) Clent Jacks, MD as Consulting Physician (Ophthalmology) Lindwood Coke, MD as Consulting Physician (Dermatology) Thornell Sartorius, MD as Consulting Physician (Otolaryngology) Richmond Campbell, MD as Consulting Physician (Gastroenterology) Melina Modena, [facility]  Extended Emergency Contact Information Primary Emergency Contact: Broady,Peggy Address: [phone]. [address]          [place] [address] [place] Phone: [phone] Relation: Spouse Secondary Emergency Contact: [place], [facility] Phone: [phone] Mobile Phone: [phone] Relation: Daughter  Code Status: DNR  Goals of Care: Advanced Directive information Advanced Directives 05/31/2018  Does Patient Have a Medical Advance Directive? Yes  Type of Paramedic of [place];Living will;Out of facility DNR (pink MOST or yellow form)  Does patient want to make changes to medical advance directive? No - Patient declined  Copy of [place] in Chart? Yes  Would patient like information on creating a medical advance directive? -  Pre-existing out of facility DNR order (yellow form or pink MOST form) Pink MOST form placed in chart (order not valid for inpatient use)      Chief Complaint  Patient presents with  . Medical Management of Chronic Issues    3 month follow up. Patient stated that he has been dealing with some right shoulder discomfort for a week now. No medication taken to help with the discomfort.   . Medication Refill    No refills needed at this time.   Marland Kitchen Results    Discuss labs    HPI: Patient is a 84 y.o. male seen today for routine visit.   Type 2 diabetes mellitus- taking januvia 100 mg daily and  pioglitazone 45 mg daily. Also on baby aspirin.   Vertigo- continues to have some dizziness with sudden position change, stable with slow position changes. Currently on meclizine 25 mg tid but taking it one to two times a day when he remembers. Good with hydration  b12 deficiency- taking b12 supplement. Denies numbness or tingling. Denies fall.   Past Medical History:  Diagnosis Date  . Abdominal pain, other specified site   . Allergic rhinitis due to pollen   . Benign paroxysmal positional vertigo   . Disturbance of skin sensation    left great toe  . Elevated prostate specific antigen (PSA)   . Hematuria, unspecified   . Hypertrophy of prostate without urinary obstruction and other lower urinary tract symptoms (LUTS)   . Impotence of organic origin   . Left knee pain 05/28/2014  . Macrocytosis   . Other and unspecified hyperlipidemia   . Other malaise and fatigue   . Spermatocele    bilateral  . Type II or unspecified type diabetes mellitus without mention of complication, uncontrolled   . Unspecified essential hypertension   . Unspecified glaucoma(365.9)    Past Surgical History:  Procedure Laterality Date  . CATARACT EXTRACTION EXTRACAPSULAR  2008   bilateraly Dr. Katy Fitch  . COLONOSCOPY  01/03/2002   normal Dr. Earlean Shawl  . PROSTATE BIOPSY  1995   due to elevated PSA normal  . TONSILLECTOMY      reports that he quit smoking about 70 years ago. He quit after 4.00 years of use. He has never used smokeless tobacco. He reports that he does not drink  alcohol or use drugs. Social History   Socioeconomic History  . Marital status: Married    Spouse name: Not on file  . Number of children: Not on file  . Years of education: Not on file  . Highest education level: Not on file  Occupational History  . Occupation: retired, self employed  Social Needs  . Financial resource strain: Not on file  . Food insecurity:    Worry: Not on file    Inability: Not on file  . Transportation  needs:    Medical: Not on file    Non-medical: Not on file  Tobacco Use  . Smoking status: Former Smoker    Years: 4.00    Last attempt to quit: 04/09/1948    Years since quitting: 70.1  . Smokeless tobacco: Never Used  Substance and Sexual Activity  . Alcohol use: No  . Drug use: No  . Sexual activity: Yes  Lifestyle  . Physical activity:    Days per week: Not on file    Minutes per session: Not on file  . Stress: Not on file  Relationships  . Social connections:    Talks on phone: Not on file    Gets together: Not on file    Attends religious service: Not on file    Active member of club or organization: Not on file    Attends meetings of clubs or organizations: Not on file    Relationship status: Not on file  . Intimate partner violence:    Fear of current or ex partner: Not on file    Emotionally abused: Not on file    Physically abused: Not on file    Forced sexual activity: Not on file  Other Topics Concern  . Not on file  Social History Narrative   Lives at [facility]    Married    Living Will    Functional Status Survey:    Family History  Problem Relation Age of Onset  . Cancer Father        lung  . Cancer Brother        adrenal gland  . Cancer Son        liver    Health Maintenance  Topic Date Due  . Samul Dada  10/07/1943  . OPHTHALMOLOGY EXAM  12/31/2016  . INFLUENZA VACCINE  07/20/2018  . FOOT EXAM  11/09/2018  . HEMOGLOBIN A1C  11/24/2018  . URINE MICROALBUMIN  05/26/2019  . PNA vac Low Risk Adult  Completed    Allergies  Allergen Reactions  . Metformin And Related Other (See Comments)    unknown  . Sulfa Antibiotics Swelling  . Tetanus Toxoids Other (See Comments)    unknown  . Typhoid Vaccines Other (See Comments)    unknown    Outpatient Encounter Medications as of 05/31/2018  Medication Sig  . aspirin 81 MG tablet Take 81 mg by mouth daily.  Marland Kitchen glucose blood test strip Use one strip to test blood sugar once a day  before breakfast . Dx E11.9  . JANUVIA 100 MG tablet TAKE 1 TABLET BY MOUTH EVERY MORNING TO CONTROL DIABETES  . MICROLET LANCETS MISC Use one lancet each morning to test blood sugar. Dx. E11.9  . pioglitazone (ACTOS) 45 MG tablet TAKE 1 TABLET BY MOUTH EVERY DAY TO CONTROL BLOOD SUGAR  . vitamin B-12 (CYANOCOBALAMIN) 1000 MCG tablet Take 1 tablet (1,000 mcg total) by mouth daily.  . meclizine (ANTIVERT) 25 MG tablet Take 1 tablet (  25 mg total) by mouth 3 (three) times daily. (Patient not taking: Reported on 05/31/2018)  . Selenium 200 MCG TABS Take 1 tablet by mouth daily.    No facility-administered encounter medications on file as of 05/31/2018.     Review of Systems  Constitutional: Negative for appetite change, chills, fatigue and fever.  HENT: Negative for congestion, mouth sores, postnasal drip, sore throat and trouble swallowing.   Eyes: Positive for visual disturbance.       Uptodate with eye exam- 1 week back. No diabetic retinopathy reported  Respiratory: Negative for cough and shortness of breath.   Cardiovascular: Negative for chest pain, palpitations and leg swelling.  Gastrointestinal: Negative for abdominal pain, constipation, diarrhea, nausea and vomiting.  Musculoskeletal: Negative for arthralgias, back pain, joint swelling and neck pain.       No fall reported. Right shoulder bothering him for last 10 days. ROM especially with abduction is difficult. No recent injury reported but about a year back had hurt his right shoulder while trying to hold his wife from falling. Denies any neurological symptom  Skin: Negative for rash.  Neurological: Positive for dizziness. Negative for seizures, syncope, weakness, numbness and headaches.  Psychiatric/Behavioral: Negative for behavioral problems. The patient is not nervous/anxious.     Vitals:   05/31/18 0857  BP: 124/72  Pulse: 72  Resp: 16  Temp: 97.6 F (36.4 C)  TempSrc: Oral  SpO2: 94%  Weight: 158 lb 3.2 oz (71.8 kg)    Height: '5\' 6"'$  (1.676 m)   Body mass index is 25.53 kg/m.   Wt Readings from Last 3 Encounters:  05/31/18 158 lb 3.2 oz (71.8 kg)  03/01/18 151 lb (68.5 kg)  02/22/18 153 lb 6.4 oz (69.6 kg)   Physical Exam  Constitutional: He appears well-developed and well-nourished. No distress.  HENT:  Head: Normocephalic and atraumatic.  Right Ear: External ear normal.  Left Ear: External ear normal.  Nose: Nose normal.  Mouth/Throat: Oropharynx is clear and moist. No oropharyngeal exudate.  Eyes: Pupils are equal, round, and reactive to light. Conjunctivae and EOM are normal. Right eye exhibits no discharge. Left eye exhibits no discharge.  Has corrective glasses  Neck: Normal range of motion. Neck supple.  Cardiovascular: Normal rate, regular rhythm and intact distal pulses.  Pulmonary/Chest: Effort normal and breath sounds normal. He has no wheezes. He has no rales.  Abdominal: Soft. Bowel sounds are normal. There is no tenderness.  Musculoskeletal: He exhibits no edema.  Lymphadenopathy:    He has no cervical adenopathy.  Neurological: He is alert. No cranial nerve deficit. He exhibits normal muscle tone.  Oriented to person, place and time  Skin: Skin is warm and dry. He is not diaphoretic.  Psychiatric: He has a normal mood and affect. His behavior is normal.    Labs reviewed: Basic Metabolic Panel: Recent Labs    02/11/18 0253 02/23/18 0000 05/25/18 0000  NA 137 140 143  K 4.2 4.2 4.1  CL 104 104 107  CO2 '23 28 29  '$ GLUCOSE 179* 145* 129*  BUN 24* 14 17  CREATININE 0.79 0.75 0.70  CALCIUM 8.6* 8.6 8.6   Liver Function Tests: Recent Labs    07/08/17 0807 07/25/17 0800 01/30/18 0000 02/23/18 0000 05/25/18 0000  AST '15 15 15 15 15  '$ ALT 9 8* 7* 7* 7*  ALKPHOS 69 70  --   --   --   BILITOT 1.0 0.8 0.7 0.5 0.6  PROT 6.1 6.4 6.2 6.2 6.4  ALBUMIN 3.9 4.1  --   --   --    No results for input(s): LIPASE, AMYLASE in the last 8760 hours. No results for input(s):  AMMONIA in the last 8760 hours. CBC: Recent Labs    07/25/17 0800  02/11/18 0253 02/16/18 0000 02/23/18 0000 05/25/18 0000  WBC 6.7   < > 8.1 8.6 6.2 5.4  NEUTROABS 4,154  --  5.9 5,917  --   --   HGB 13.5   < > 11.9* 10.9* 11.2* 13.1*  HCT 40.5   < > 35.6* 32.3* 33.1* 38.2*  MCV 100.0   < > 99.7 98.2 96.8 95.3  PLT 292   < > 269 378 407* 268   < > = values in this interval not displayed.   Cardiac Enzymes: No results for input(s): CKTOTAL, CKMB, CKMBINDEX, TROPONINI in the last 8760 hours. BNP: Invalid input(s): POCBNP Lab Results  Component Value Date   HGBA1C 7.2 (H) 05/25/2018   Lab Results  Component Value Date   TSH 2.67 01/30/2018   Lab Results  Component Value Date   VITAMINB12 289 02/03/2017   No results found for: FOLATE No results found for: IRON, TIBC, FERRITIN  Lipid Panel: Recent Labs    07/25/17 0800 01/30/18 0000  CHOL 126 132  HDL 59 54  LDLCALC 51 59  TRIG 78 106  CHOLHDL 2.1 2.4   Lab Results  Component Value Date   HGBA1C 7.2 (H) 05/25/2018    Procedures since last visit: No results found.  Assessment/Plan  1. Vertigo Advised to take meclizine 25 mg bid and monitor - meclizine (ANTIVERT) 25 MG tablet; Take 1 tablet (25 mg total) by mouth 2 (two) times daily.  Dispense: 90 tablet; Refill: 3  2. Acute pain of right shoulder Advised to take tylenol 500 mg bid for now. Ice pack bid for now. If no improvement, reassess - acetaminophen (TYLENOL) 500 MG tablet; Take 1 tablet (500 mg total) by mouth 2 (two) times daily.  Dispense: 60 tablet; Refill: 0  3. Type 2 diabetes mellitus with complication, without long-term current use of insulin ([place]) a1c reviewed. Continue januvia and actos. Advised to exercise for weight loss and DASH diet.  - CMP with eGFR([facility]); Future - Hemoglobin A1c; Future  4. Mixed hyperlipidemia Stable, monitor periodically - CMP with eGFR([facility]); Future - Hemoglobin A1c; Future - Lipid Panel;  Future    Labs/tests ordered:   Lab Orders     CMP with eGFR([facility])     Hemoglobin A1c     Lipid Panel   Next appointment: 3 months  Communication: reviewed care plan with patient     Blanchie Serve, MD Internal Medicine [place], [place] [address] Cell Phone (Monday-Friday 8 am - 5 pm): [phone] On Call: [phone] and follow prompts after 5 pm and on weekends Office Phone: [phone] Office Fax: [phone]

## 2018-05-31 NOTE — Patient Instructions (Signed)
  Take your meclizine twice a day to help with dizziness.  Maintain hydration  Take tylenol extra strength 1000 mg twice a day for 2 weeks for your right shoulder pain. Apply ice pack twice a day as well. Avoid lifting heavy object. If pain does not improve, let my office know. I will then order xray for your shoulder to assess further. If you develop any numbness or tingling in your arm or fingers, let us know.

## 2018-08-02 ENCOUNTER — Encounter: Payer: Self-pay | Admitting: Internal Medicine

## 2018-08-30 ENCOUNTER — Other Ambulatory Visit: Payer: Self-pay | Admitting: Internal Medicine

## 2018-08-30 DIAGNOSIS — E782 Mixed hyperlipidemia: Secondary | ICD-10-CM | POA: Diagnosis not present

## 2018-08-30 DIAGNOSIS — E119 Type 2 diabetes mellitus without complications: Secondary | ICD-10-CM

## 2018-08-30 DIAGNOSIS — Z794 Long term (current) use of insulin: Principal | ICD-10-CM

## 2018-08-30 DIAGNOSIS — E118 Type 2 diabetes mellitus with unspecified complications: Secondary | ICD-10-CM | POA: Diagnosis not present

## 2018-08-31 DIAGNOSIS — E782 Mixed hyperlipidemia: Secondary | ICD-10-CM

## 2018-08-31 DIAGNOSIS — E118 Type 2 diabetes mellitus with unspecified complications: Secondary | ICD-10-CM

## 2018-09-01 LAB — LIPID PANEL
CHOL/HDL RATIO: 2.4 (calc) (ref <5.0)
Cholesterol: 139 mg/dL (ref <200)
HDL: 57 mg/dL (ref >40)
LDL Cholesterol (Calc): 67 mg/dL (calc)
NON-HDL CHOLESTEROL (CALC): 82 mg/dL (ref <130)
Triglycerides: 72 mg/dL (ref <150)

## 2018-09-01 LAB — COMPLETE METABOLIC PANEL WITH GFR
AG Ratio: 1.8 (calc) (ref 1.0–2.5)
ALKALINE PHOSPHATASE (APISO): 67 U/L (ref 40–115)
ALT: 8 U/L — ABNORMAL LOW (ref 9–46)
AST: 15 U/L (ref 10–35)
Albumin: 4.1 g/dL (ref 3.6–5.1)
BUN: 21 mg/dL (ref 7–25)
CALCIUM: 9 mg/dL (ref 8.6–10.3)
CHLORIDE: 106 mmol/L (ref 98–110)
CO2: 28 mmol/L (ref 20–32)
CREATININE: 0.82 mg/dL (ref 0.70–1.11)
GFR, Est African American: 90 mL/min/{1.73_m2} (ref >=60)
GFR, Est Non African American: 77 mL/min/{1.73_m2} (ref >=60)
GLUCOSE: 108 mg/dL — AB (ref 65–99)
Globulin: 2.3 g/dL (calc) (ref 1.9–3.7)
POTASSIUM: 4.3 mmol/L (ref 3.5–5.3)
SODIUM: 141 mmol/L (ref 135–146)
Total Bilirubin: 0.9 mg/dL (ref 0.2–1.2)
Total Protein: 6.4 g/dL (ref 6.1–8.1)

## 2018-09-01 LAB — HEMOGLOBIN A1C
EAG (MMOL/L): 8.2 (calc)
Hgb A1c MFr Bld: 6.8 % of total Hgb — ABNORMAL HIGH (ref <5.7)
Mean Plasma Glucose: 148 (calc)

## 2018-09-06 ENCOUNTER — Non-Acute Institutional Stay: Payer: Medicare Other | Admitting: Internal Medicine

## 2018-09-06 ENCOUNTER — Other Ambulatory Visit: Payer: Self-pay

## 2018-09-06 ENCOUNTER — Encounter: Payer: Self-pay | Admitting: Internal Medicine

## 2018-09-06 VITALS — BP 124/76 | HR 62 | Temp 98.6°F | Ht 66.0 in | Wt 154.0 lb

## 2018-09-06 DIAGNOSIS — E538 Deficiency of other specified B group vitamins: Secondary | ICD-10-CM

## 2018-09-06 DIAGNOSIS — E118 Type 2 diabetes mellitus with unspecified complications: Secondary | ICD-10-CM

## 2018-09-06 DIAGNOSIS — E782 Mixed hyperlipidemia: Secondary | ICD-10-CM

## 2018-09-06 DIAGNOSIS — R42 Dizziness and giddiness: Secondary | ICD-10-CM

## 2018-09-06 DIAGNOSIS — I10 Essential (primary) hypertension: Secondary | ICD-10-CM

## 2018-09-06 MED ORDER — MECLIZINE HCL 25 MG PO TABS: 25.0000 mg | ORAL_TABLET | Freq: Two times a day (BID) | ORAL | 3 refills | Status: DC | PRN

## 2018-09-06 NOTE — Progress Notes (Signed)
[facility]  Provider: Blanchie Serve MD   Location:  [place] of Service:  Clinic (12)  PCP: Blanchie Serve, MD Patient Care Team: Blanchie Serve, MD as PCP - General (Internal Medicine) Clent Jacks, MD as Consulting Physician (Ophthalmology) Lindwood Coke, MD as Consulting Physician (Dermatology) Thornell Sartorius, MD as Consulting Physician (Otolaryngology) Richmond Campbell, MD as Consulting Physician (Gastroenterology) Melina Modena, [facility]  Extended Emergency Contact Information Primary Emergency Contact: Calk,Peggy Address: [id number]          [place] [address] [place] Phone: [phone] Relation: Spouse Secondary Emergency Contact: Amedio, [place] [facility] Phone: [phone] Mobile Phone: [phone] Relation: Daughter  Code Status: DNR  Goals of Care: Advanced Directive information Advanced Directives 09/06/2018  Does Patient Have a Medical Advance Directive? Yes  Type of Paramedic of [place];Out of facility DNR (pink MOST or yellow form);Living will  Does patient want to make changes to medical advance directive? -  Copy of [place] in Chart? Yes  Would patient like information on creating a medical advance directive? -  Pre-existing out of facility DNR order (yellow form or pink MOST form) Yellow form placed in chart (order not valid for inpatient use)      Chief Complaint  Patient presents with  . Medical Management of Chronic Issues    Resident is being seen for a 3 month follow up.   . Audit C Screening    Score of 0    HPI: Patient is a 84 y.o. male seen today for routine visit.   Type 2 diabetes- carefully follows his diet. Takes actos and [place] at present. Tolerating well. Denies hypoglycemic symptoms.   b12 deficiency- takes vitamin b12 daily. Denies numbness or tingling. Denies confusion or behavioral issue  Vertigo-  controlled symptom. No further complaints of dizziness. Has not required meclizine for months.   HTN- controlled BP, off medication  Past Medical History:  Diagnosis Date  . Abdominal pain, other specified site   . Allergic rhinitis due to pollen   . Benign paroxysmal positional vertigo   . Disturbance of skin sensation    left great toe  . Elevated prostate specific antigen (PSA)   . Hematuria, unspecified   . Hypertrophy of prostate without urinary obstruction and other lower urinary tract symptoms (LUTS)   . Impotence of organic origin   . Left knee pain 05/28/2014  . Macrocytosis   . Other and unspecified hyperlipidemia   . Other malaise and fatigue   . Spermatocele    bilateral  . Type II or unspecified type diabetes mellitus without mention of complication, uncontrolled   . Unspecified essential hypertension   . Unspecified glaucoma(365.9)    Past Surgical History:  Procedure Laterality Date  . CATARACT EXTRACTION EXTRACAPSULAR  2008   bilateraly Dr. Katy Fitch  . COLONOSCOPY  01/03/2002   normal Dr. Earlean Shawl  . PROSTATE BIOPSY  1995   due to elevated PSA normal  . TONSILLECTOMY      reports that he quit smoking about 70 years ago. He quit after 4.00 years of use. He has never used smokeless tobacco. He reports that he does not drink alcohol or use drugs. Social History   Socioeconomic History  . Marital status: Married    Spouse name: Not on file  . Number of children: Not on file  . Years of education: Not on file  . Highest education level: Not  on file  Occupational History  . Occupation: retired, self employed  Social Needs  . Financial resource strain: Not on file  . Food insecurity:    Worry: Not on file    Inability: Not on file  . Transportation needs:    Medical: Not on file    Non-medical: Not on file  Tobacco Use  . Smoking status: Former Smoker    Years: 4.00    Last attempt to quit: 04/09/1948    Years since quitting: 70.4  . Smokeless tobacco: Never  Used  Substance and Sexual Activity  . Alcohol use: No  . Drug use: No  . Sexual activity: Yes  Lifestyle  . Physical activity:    Days per week: Not on file    Minutes per session: Not on file  . Stress: Not on file  Relationships  . Social connections:    Talks on phone: Not on file    Gets together: Not on file    Attends religious service: Not on file    Active member of club or organization: Not on file    Attends meetings of clubs or organizations: Not on file    Relationship status: Not on file  . Intimate partner violence:    Fear of current or ex partner: Not on file    Emotionally abused: Not on file    Physically abused: Not on file    Forced sexual activity: Not on file  Other Topics Concern  . Not on file  Social History Narrative   [facility]    Married    Living Will    Functional Status Survey:    Family History  Problem Relation Age of Onset  . Cancer Father        lung  . Cancer Brother        adrenal gland  . Cancer Son        liver    Health Maintenance  Topic Date Due  . INFLUENZA VACCINE  10/19/2018 (Originally 07/20/2018)  . TETANUS/TDAP  09/07/2019 (Originally 10/07/1943)  . FOOT EXAM  11/09/2018  . HEMOGLOBIN A1C  02/28/2019  . OPHTHALMOLOGY EXAM  05/17/2019  . URINE MICROALBUMIN  05/26/2019  . PNA vac Low Risk Adult  Completed    Allergies  Allergen Reactions  . Metformin And Related Other (See Comments)    unknown  . Sulfa Antibiotics Swelling  . Tetanus Toxoids Other (See Comments)    unknown  . Typhoid Vaccines Other (See Comments)    unknown    Outpatient Encounter Medications as of 09/06/2018  Medication Sig  . acetaminophen (TYLENOL) 500 MG tablet Take 1 tablet (500 mg total) by mouth 2 (two) times daily.  Marland Kitchen aspirin 81 MG tablet Take 81 mg by mouth daily.  Marland Kitchen JANUVIA 100 MG tablet TAKE 1 TABLET BY MOUTH EVERY MORNING TO CONTROL DIABETES  . meclizine (ANTIVERT) 25 MG tablet Take 1 tablet (25 mg total) by  mouth 2 (two) times daily.  . pioglitazone (ACTOS) 45 MG tablet TAKE 1 TABLET BY MOUTH EVERY DAY TO CONTROL BLOOD SUGAR  . Selenium 200 MCG TABS Take 1 tablet by mouth daily.   . vitamin B-12 (CYANOCOBALAMIN) 1000 MCG tablet Take 1 tablet (1,000 mcg total) by mouth daily.  Marland Kitchen glucose blood test strip Use one strip to test blood sugar once a day before breakfast . Dx E11.9 (Patient not taking: Reported on 09/06/2018)  . MICROLET LANCETS MISC Use one lancet each morning  to test blood sugar. Dx. E11.9 (Patient not taking: Reported on 09/06/2018)   No facility-administered encounter medications on file as of 09/06/2018.    Fall Risk  09/06/2018 08/26/2017 07/27/2016 01/27/2016 07/22/2015  Falls in the past year? No No No No No    Review of Systems  Constitutional: Negative for appetite change, chills, fatigue and fever.  HENT: Negative for congestion, mouth sores, postnasal drip, rhinorrhea, sinus pressure and trouble swallowing.   Eyes: Positive for visual disturbance.  Respiratory: Negative for cough and shortness of breath.   Cardiovascular: Negative for chest pain and palpitations.  Gastrointestinal: Negative for abdominal pain, constipation, diarrhea, nausea and vomiting.  Genitourinary: Negative for dysuria and hematuria.       Wakes up once at night to urinate  Musculoskeletal: Negative for arthralgias, back pain and gait problem.  Skin: Negative for rash and wound.  Neurological: Negative for dizziness, syncope, weakness and headaches.  Psychiatric/Behavioral: Negative for behavioral problems, confusion and dysphoric mood. The patient is not nervous/anxious.     Vitals:   09/06/18 1124  BP: 124/76  Pulse: 62  Temp: 98.6 F (37 C)  TempSrc: Oral  SpO2: 99%  Weight: 154 lb (69.9 kg)  Height: '5\' 6"'  (1.676 m)   Body mass index is 24.86 kg/m.   Wt Readings from Last 3 Encounters:  09/06/18 154 lb (69.9 kg)  05/31/18 158 lb 3.2 oz (71.8 kg)  03/01/18 151 lb (68.5 kg)   Physical Exam    Constitutional: He is oriented to person, place, and time. He appears well-developed and well-nourished. No distress.  HENT:  Head: Normocephalic and atraumatic.  Nose: Nose normal.  Mouth/Throat: Oropharynx is clear and moist. No oropharyngeal exudate.  Left ear impacted with cerumen, right ear has some wax, able to visualize tympanic membrane to right ear  Eyes: Pupils are equal, round, and reactive to light. Conjunctivae and EOM are normal. Right eye exhibits no discharge. Left eye exhibits no discharge.  Has corrective glasses  Neck: Normal range of motion. Neck supple.  Cardiovascular: Normal rate, regular rhythm and intact distal pulses.  Pulmonary/Chest: Effort normal and breath sounds normal. He has no wheezes. He has no rales.  Abdominal: Soft. Bowel sounds are normal. He exhibits no mass. There is no guarding.  Musculoskeletal: Normal range of motion. He exhibits no tenderness or deformity.  Trace ankle edema  Lymphadenopathy:    He has no cervical adenopathy.  Neurological: He is alert and oriented to person, place, and time. He exhibits normal muscle tone.  Skin: Skin is warm and dry. He is not diaphoretic.  Psychiatric: He has a normal mood and affect.       Nursing Home from 08/26/2017 in [place]  PHQ-2 Total Score  0     Labs reviewed: Basic Metabolic Panel: Recent Labs    02/23/18 0000 05/25/18 0000 08/30/18 0000  NA 140 143 141  K 4.2 4.1 4.3  CL 104 107 106  CO2 '28 29 28  ' GLUCOSE 145* 129* 108*  BUN '14 17 21  ' CREATININE 0.75 0.70 0.82  CALCIUM 8.6 8.6 9.0   Liver Function Tests: Recent Labs    02/23/18 0000 05/25/18 0000 08/30/18 0000  AST '15 15 15  ' ALT 7* 7* 8*  BILITOT 0.5 0.6 0.9  PROT 6.2 6.4 6.4   No results for input(s): LIPASE, AMYLASE in the last 8760 hours. No results for input(s): AMMONIA in the last 8760 hours. CBC: Recent Labs    02/11/18 0253 02/16/18  0000 02/23/18 0000 05/25/18 0000  WBC 8.1 8.6 6.2 5.4   NEUTROABS 5.9 5,917  --   --   HGB 11.9* 10.9* 11.2* 13.1*  HCT 35.6* 32.3* 33.1* 38.2*  MCV 99.7 98.2 96.8 95.3  PLT 269 378 407* 268   Cardiac Enzymes: No results for input(s): CKTOTAL, CKMB, CKMBINDEX, TROPONINI in the last 8760 hours. BNP: Invalid input(s): POCBNP Lab Results  Component Value Date   HGBA1C 6.8 (H) 08/30/2018   Lab Results  Component Value Date   TSH 2.67 01/30/2018   Lab Results  Component Value Date   VITAMINB12 289 02/03/2017   No results found for: FOLATE No results found for: IRON, TIBC, FERRITIN  Lipid Panel: Recent Labs    01/30/18 0000 08/30/18 0000  CHOL 132 139  HDL 54 57  LDLCALC 59 67  TRIG 106 72  CHOLHDL 2.4 2.4   Lab Results  Component Value Date   HGBA1C 6.8 (H) 08/30/2018    Procedures since last visit: No results found.  Assessment/Plan  1. Vertigo Continue meclizine 12.5 mg bid prn. - meclizine (ANTIVERT) 25 MG tablet; Take 1 tablet (25 mg total) by mouth 2 (two) times daily as needed for dizziness.  Dispense: 90 tablet; Refill: 3  2. Type 2 diabetes mellitus with complication, without long-term current use of insulin (HCC) Reviewed a1c. Continue current oral hypoglycemic agent. Check a1c next lab. Reviewed lipid panel.   3. B12 deficiency Continue b12 supplement. Check b12 level next lab  4. Essential hypertension Controlled BP readings. Reviewed bmp. Off medications  5. Mixed hyperlipidemia LDL at goal. Not on statin. Monitor.   Labs/tests ordered:  Bmp and a1c in 3 months, CMP with eGFR, CBC, lipid, a1c, b12 in 6 months  Next appointment: 6 months for physical with MMSE, EKG and PHQ 9  Communication: reviewed care plan with patient.    Blanchie Serve, MD Internal Medicine [facility] [address] [place], [place] [address] Cell Phone (Monday-Friday 8 am - 5 pm): [phone] On Call: [phone] and follow prompts after 5 pm and on weekends Office Phone:  [phone] Office Fax: [phone]

## 2018-09-13 DIAGNOSIS — C44219 Basal cell carcinoma of skin of left ear and external auricular canal: Secondary | ICD-10-CM | POA: Diagnosis not present

## 2018-09-13 DIAGNOSIS — D1801 Hemangioma of skin and subcutaneous tissue: Secondary | ICD-10-CM | POA: Diagnosis not present

## 2018-09-13 DIAGNOSIS — D485 Neoplasm of uncertain behavior of skin: Secondary | ICD-10-CM | POA: Diagnosis not present

## 2018-09-13 DIAGNOSIS — Z85828 Personal history of other malignant neoplasm of skin: Secondary | ICD-10-CM | POA: Diagnosis not present

## 2018-09-13 DIAGNOSIS — D225 Melanocytic nevi of trunk: Secondary | ICD-10-CM | POA: Diagnosis not present

## 2018-09-13 DIAGNOSIS — L821 Other seborrheic keratosis: Secondary | ICD-10-CM | POA: Diagnosis not present

## 2018-09-13 DIAGNOSIS — D3612 Benign neoplasm of peripheral nerves and autonomic nervous system, upper limb, including shoulder: Secondary | ICD-10-CM | POA: Diagnosis not present

## 2018-09-16 ENCOUNTER — Other Ambulatory Visit: Payer: Self-pay | Admitting: Internal Medicine

## 2018-09-27 ENCOUNTER — Telehealth: Payer: Self-pay | Admitting: Internal Medicine

## 2018-09-27 NOTE — Telephone Encounter (Signed)
I left a message asking the patient to call me at [phone] to schedule AWV with Clarise Cruz at [facility] on afternoon of 10/04/18 if available. VDM (DD)

## 2018-10-04 ENCOUNTER — Non-Acute Institutional Stay: Payer: Medicare Other

## 2018-10-04 VITALS — BP 138/60 | HR 80 | Temp 97.5°F | Ht 66.0 in | Wt 154.0 lb

## 2018-10-04 DIAGNOSIS — Z0000 Encounter for general adult medical examination without abnormal findings: Secondary | ICD-10-CM

## 2018-10-04 NOTE — Progress Notes (Addendum)
Subjective:   George [name] is a 84 y.o. male who presents for Medicare Annual/Subsequent preventive examination at [facility]  Last AWV-08/26/2017      Objective:    Vitals: BP 138/60 (BP Location: Left Arm, Patient Position: Sitting)   Pulse 80   Temp (!) 97.5 F (36.4 C) (Oral)   Ht 5\' 6"  (1.676 m)   Wt 154 lb (69.9 kg)   BMI 24.86 kg/m   Body mass index is 24.86 kg/m.  Advanced Directives 10/04/2018 09/06/2018 05/31/2018 02/11/2018 02/09/2018 02/09/2018 02/08/2018  Does Patient Have a Medical Advance Directive? Yes Yes Yes Yes Yes Yes Yes  Type of Paramedic of [place];Out of facility DNR (pink MOST or yellow form);Living will [place];Out of facility DNR (pink MOST or yellow form);Living will [place];Living will;Out of facility DNR (pink MOST or yellow form) Out of facility DNR (pink MOST or yellow form) [place];Living will [place];Living will [place];Living will  Does patient want to make changes to medical advance directive? No - Patient declined - No - Patient declined - - - -  Copy of [place] in Chart? Yes Yes Yes - - Yes No - copy requested  Would patient like information on creating a medical advance directive? - - - - - - -  Pre-existing out of facility DNR order (yellow form or pink MOST form) Yellow form placed in chart (order not valid for inpatient use) Yellow form placed in chart (order not valid for inpatient use) Pink MOST form placed in chart (order not valid for inpatient use) - - - -    Tobacco Social History   Tobacco Use  Smoking Status Former Smoker  . Years: 4.00  . Last attempt to quit: 04/09/1948  . Years since quitting: 70.5  Smokeless Tobacco Never Used     Counseling given: Not Answered   Clinical Intake:  Pre-visit preparation completed: No  Pain : No/denies  pain     Diabetes: Yes CBG done?: No Did pt. bring in CBG monitor from home?: No  How often do you need to have someone help you when you read instructions, pamphlets, or other written materials from your doctor or pharmacy?: 1 - Never What is the last grade level you completed in school?: [employer]  Interpreter Needed?: No  Information entered by :: Tyson Dense, RN  Past Medical History:  Diagnosis Date  . Abdominal pain, other specified site   . Allergic rhinitis due to pollen   . Benign paroxysmal positional vertigo   . Disturbance of skin sensation    left great toe  . Elevated prostate specific antigen (PSA)   . Hematuria, unspecified   . Hypertrophy of prostate without urinary obstruction and other lower urinary tract symptoms (LUTS)   . Impotence of organic origin   . Left knee pain 05/28/2014  . Macrocytosis   . Other and unspecified hyperlipidemia   . Other malaise and fatigue   . Spermatocele    bilateral  . Type II or unspecified type diabetes mellitus without mention of complication, uncontrolled   . Unspecified essential hypertension   . Unspecified glaucoma(365.9)    Past Surgical History:  Procedure Laterality Date  . CATARACT EXTRACTION EXTRACAPSULAR  2008   bilateraly Dr. Katy Fitch  . COLONOSCOPY  01/03/2002   normal Dr. Earlean Shawl  . PROSTATE BIOPSY  1995   due to elevated PSA  normal  . TONSILLECTOMY     Family History  Problem Relation Age of Onset  . Cancer Father        lung  . Cancer Brother        adrenal gland  . Cancer Son        liver   Social History   Socioeconomic History  . Marital status: Married    Spouse name: Not on file  . Number of children: Not on file  . Years of education: Not on file  . Highest education level: Not on file  Occupational History  . Occupation: retired, self employed  Social Needs  . Financial resource strain: Not hard at all  . Food insecurity:    Worry: Never true    Inability: Never true  .  Transportation needs:    Medical: No    Non-medical: No  Tobacco Use  . Smoking status: Former Smoker    Years: 4.00    Last attempt to quit: 04/09/1948    Years since quitting: 70.5  . Smokeless tobacco: Never Used  Substance and Sexual Activity  . Alcohol use: No  . Drug use: No  . Sexual activity: Yes  Lifestyle  . Physical activity:    Days per week: 0 days    Minutes per session: 0 min  . Stress: Not at all  Relationships  . Social connections:    Talks on phone: More than three times a week    Gets together: More than three times a week    Attends religious service: More than 4 times per year    Active member of club or organization: Yes    Attends meetings of clubs or organizations: More than 4 times per year    Relationship status: Married  Other Topics Concern  . Not on file  Social History Narrative   [facility] at [facility]    Married    Living Will    Outpatient Encounter Medications as of 10/04/2018  Medication Sig  . acetaminophen (TYLENOL) 500 MG tablet Take 1 tablet (500 mg total) by mouth 2 (two) times daily.  Marland Kitchen aspirin 81 MG tablet Take 81 mg by mouth daily.  Marland Kitchen JANUVIA 100 MG tablet TAKE 1 TABLET BY MOUTH EVERY MORNING TO CONTROL DIABETES  . meclizine (ANTIVERT) 25 MG tablet Take 1 tablet (25 mg total) by mouth 2 (two) times daily as needed for dizziness.  . pioglitazone (ACTOS) 45 MG tablet TAKE 1 TABLET BY MOUTH EVERY DAY TO CONTROL BLOOD SUGAR  . Selenium 200 MCG TABS Take 1 tablet by mouth daily.   . vitamin B-12 (CYANOCOBALAMIN) 1000 MCG tablet Take 1 tablet (1,000 mcg total) by mouth daily.  Marland Kitchen glucose blood test strip Use one strip to test blood sugar once a day before breakfast . Dx E11.9 (Patient not taking: Reported on 09/06/2018)  . MICROLET LANCETS MISC Use one lancet each morning to test blood sugar. Dx. E11.9 (Patient not taking: Reported on 09/06/2018)   No facility-administered encounter medications on file as of 10/04/2018.      Activities of Daily Living In your present state of health, do you have any difficulty performing the following activities: 10/04/2018  Hearing? N  Vision? N  Difficulty concentrating or making decisions? N  Walking or climbing stairs? N  Dressing or bathing? N  Doing errands, shopping? N  Preparing Food and eating ? N  Using the Toilet? N  In the past six months, have you accidently leaked  urine? N  Do you have problems with loss of bowel control? N  Managing your Medications? N  Managing your Finances? N  Housekeeping or managing your Housekeeping? N  Some recent data might be hidden    Patient Care Team: Blanchie Serve, MD as PCP - General (Internal Medicine) Clent Jacks, MD as Consulting Physician (Ophthalmology) Lindwood Coke, MD as Consulting Physician (Dermatology) Thornell Sartorius, MD as Consulting Physician (Otolaryngology) Richmond Campbell, MD as Consulting Physician (Gastroenterology) Melina Modena, [facility]   Assessment:   This is a routine wellness examination for Maan.  Exercise Activities and Dietary recommendations Current Exercise Habits: The patient does not participate in regular exercise at present, Exercise limited by: None identified  Goals    . Check Blood sugar     Patient will check blood sugars       Fall Risk Fall Risk  10/04/2018 09/06/2018 08/26/2017 07/27/2016 01/27/2016  Falls in the past year? No No No No No   Is the patient's home free of loose throw rugs in walkways, pet beds, electrical cords, etc?   yes      Grab bars in the bathroom? yes      Handrails on the stairs?   yes      Adequate lighting?   yes  Depression Screen PHQ 2/9 Scores 10/04/2018 08/26/2017 07/27/2016 01/27/2016  PHQ - 2 Score 0 0 0 0    Cognitive Function completed within the last year MMSE - [place] Exam 02/08/2018 08/26/2017  Not completed: (No Data) -  Orientation to time 4 3  Orientation to Place 5 5  Registration 3 3  Attention/ Calculation 5 0   Recall 3 0  Language- name 2 objects 2 2  Language- repeat 1 1  Language- follow 3 step command 3 3  Language- read & follow direction 1 1  Write a sentence 1 1  Copy design 1 1  Total score 29 20        Immunization History  Administered Date(s) Administered  . Influenza Whole 09/19/2012, 09/20/2013  . Influenza, High Dose Seasonal PF 09/28/2017  . Influenza-Unspecified 10/03/2014, 09/18/2015, 09/30/2016  . Pneumococcal Conjugate-13 08/27/2017  . Pneumococcal Polysaccharide-23 10/24/2001    Qualifies for Shingles Vaccine? Yes, educated and wants to wait   Screening Tests Health Maintenance  Topic Date Due  . INFLUENZA VACCINE  10/19/2018 (Originally 07/20/2018)  . TETANUS/TDAP  09/07/2019 (Originally 10/07/1943)  . FOOT EXAM  11/09/2018  . HEMOGLOBIN A1C  02/28/2019  . OPHTHALMOLOGY EXAM  05/17/2019  . URINE MICROALBUMIN  05/26/2019  . PNA vac Low Risk Adult  Completed   Cancer Screenings: Lung: Low Dose CT Chest recommended if Age 3-80 years, 30 pack-year currently smoking OR have quit w/in 15years. Patient does not qualify. Colorectal: up to date  Additional Screenings:  Hepatitis C Screening: declined TDAP GLO:[id number]      Plan:    I have personally reviewed and addressed the Medicare Annual Wellness questionnaire and have noted the following in the patient's chart:  A. Medical and social history B. Use of alcohol, tobacco or illicit drugs  C. Current medications and supplements D. Functional ability and status E.  Nutritional status F.  Physical activity G. Advance directives H. List of other physicians I.  Hospitalizations, surgeries, and ER visits in previous 12 months J.  [place] to include hearing, vision, cognitive, depression L. Referrals and appointments - none  In addition, I have reviewed and discussed with patient certain preventive protocols, quality  metrics, and best practice recommendations. A written personalized care plan  for preventive services as well as general preventive health recommendations were provided to patient.  See attached scanned questionnaire for additional information.   Signed,   Tyson Dense, RN Nurse Health Advisor  Patient Concerns: None

## 2018-10-04 NOTE — Patient Instructions (Signed)
George [name] , Thank you for taking time to come for your Medicare Wellness Visit. I appreciate your ongoing commitment to your health goals. Please review the following plan we discussed and let me know if I can assist you in the future.   Screening recommendations/referrals: Colonoscopy excluded, over age 82 Recommended yearly ophthalmology/optometry visit for glaucoma screening and checkup Recommended yearly dental visit for hygiene and checkup  Vaccinations: Influenza vaccine up to date Pneumococcal vaccine up to date, completed Tdap vaccine due, declined Shingles vaccine due, let us know if you decide you want this    Advanced directives: in chart  Conditions/risks identified: none  Next appointment: Dinah, NP 03/27/2019 @ 2pm  Preventive Care 64 Years and Older, Male Preventive care refers to lifestyle choices and visits with your health care provider that can promote health and wellness. What does preventive care include?  A yearly physical exam. This is also called an annual well check.  Dental exams once or twice a year.  Routine eye exams. Ask your health care provider how often you should have your eyes checked.  Personal lifestyle choices, including:  Daily care of your teeth and gums.  Regular physical activity.  Eating a healthy diet.  Avoiding tobacco and drug use.  Limiting alcohol use.  Practicing safe sex.  Taking low doses of aspirin every day.  Taking vitamin and mineral supplements as recommended by your health care provider. What happens during an annual well check? The services and screenings done by your health care provider during your annual well check will depend on your age, overall health, lifestyle risk factors, and family history of disease. Counseling  Your health care provider may ask you questions about your:  Alcohol use.  Tobacco use.  Drug use.  Emotional well-being.  Home and relationship well-being.  Sexual  activity.  Eating habits.  History of falls.  Memory and ability to understand (cognition).  Work and work Statistician. Screening  You may have the following tests or measurements:  Height, weight, and BMI.  Blood pressure.  Lipid and cholesterol levels. These may be checked every 5 years, or more frequently if you are over 100 years old.  Skin check.  Lung cancer screening. You may have this screening every year starting at age 4 if you have a 30-pack-year history of smoking and currently smoke or have quit within the past 15 years.  Fecal occult blood test (FOBT) of the stool. You may have this test every year starting at age 63.  Flexible sigmoidoscopy or colonoscopy. You may have a sigmoidoscopy every 5 years or a colonoscopy every 10 years starting at age 80.  Prostate cancer screening. Recommendations will vary depending on your family history and other risks.  Hepatitis C blood test.  Hepatitis B blood test.  Sexually transmitted disease (STD) testing.  Diabetes screening. This is done by checking your blood sugar (glucose) after you have not eaten for a while (fasting). You may have this done every 1-3 years.  Abdominal aortic aneurysm (AAA) screening. You may need this if you are a current or former smoker.  Osteoporosis. You may be screened starting at age 60 if you are at high risk. Talk with your health care provider about your test results, treatment options, and if necessary, the need for more tests. Vaccines  Your health care provider may recommend certain vaccines, such as:  Influenza vaccine. This is recommended every year.  Tetanus, diphtheria, and acellular pertussis (Tdap, Td) vaccine. You may need a  Td booster every 10 years.  Zoster vaccine. You may need this after age 86.  Pneumococcal 13-valent conjugate (PCV13) vaccine. One dose is recommended after age 70.  Pneumococcal polysaccharide (PPSV23) vaccine. One dose is recommended after age  69. Talk to your health care provider about which screenings and vaccines you need and how often you need them. This information is not intended to replace advice given to you by your health care provider. Make sure you discuss any questions you have with your health care provider. Document Released: 01/02/2016 Document Revised: 08/25/2016 Document Reviewed: 10/07/2015 Elsevier Interactive Patient Education  2017 [place] Prevention in the Home Falls can cause injuries. They can happen to people of all ages. There are many things you can do to make your home safe and to help prevent falls. What can I do on the outside of my home?  Regularly fix the edges of walkways and driveways and fix any cracks.  Remove anything that might make you trip as you walk through a door, such as a raised step or threshold.  Trim any bushes or trees on the path to your home.  Use bright outdoor lighting.  Clear any walking paths of anything that might make someone trip, such as rocks or tools.  Regularly check to see if handrails are loose or broken. Make sure that both sides of any steps have handrails.  Any raised decks and porches should have guardrails on the edges.  Have any leaves, snow, or ice cleared regularly.  Use sand or salt on walking paths during winter.  Clean up any spills in your garage right away. This includes oil or grease spills. What can I do in the bathroom?  Use night lights.  Install grab bars by the toilet and in the tub and shower. Do not use towel bars as grab bars.  Use non-skid mats or decals in the tub or shower.  If you need to sit down in the shower, use a plastic, non-slip stool.  Keep the floor dry. Clean up any water that spills on the floor as soon as it happens.  Remove soap buildup in the tub or shower regularly.  Attach bath mats securely with double-sided non-slip rug tape.  Do not have throw rugs and other things on the floor that can make  you trip. What can I do in the bedroom?  Use night lights.  Make sure that you have a light by your bed that is easy to reach.  Do not use any sheets or blankets that are too big for your bed. They should not hang down onto the floor.  Have a firm chair that has side arms. You can use this for support while you get dressed.  Do not have throw rugs and other things on the floor that can make you trip. What can I do in the kitchen?  Clean up any spills right away.  Avoid walking on wet floors.  Keep items that you use a lot in easy-to-reach places.  If you need to reach something above you, use a strong step stool that has a grab bar.  Keep electrical cords out of the way.  Do not use floor polish or wax that makes floors slippery. If you must use wax, use non-skid floor wax.  Do not have throw rugs and other things on the floor that can make you trip. What can I do with my stairs?  Do not leave any items on the stairs.  Make sure that there are handrails on both sides of the stairs and use them. Fix handrails that are broken or loose. Make sure that handrails are as long as the stairways.  Check any carpeting to make sure that it is firmly attached to the stairs. Fix any carpet that is loose or worn.  Avoid having throw rugs at the top or bottom of the stairs. If you do have throw rugs, attach them to the floor with carpet tape.  Make sure that you have a light switch at the top of the stairs and the bottom of the stairs. If you do not have them, ask someone to add them for you. What else can I do to help prevent falls?  Wear shoes that:  Do not have high heels.  Have rubber bottoms.  Are comfortable and fit you well.  Are closed at the toe. Do not wear sandals.  If you use a stepladder:  Make sure that it is fully opened. Do not climb a closed stepladder.  Make sure that both sides of the stepladder are locked into place.  Ask someone to hold it for you, if  possible.  Clearly mark and make sure that you can see:  Any grab bars or handrails.  First and last steps.  Where the edge of each step is.  Use tools that help you move around (mobility aids) if they are needed. These include:  Canes.  Walkers.  Scooters.  Crutches.  Turn on the lights when you go into a dark area. Replace any light bulbs as soon as they burn out.  Set up your furniture so you have a clear path. Avoid moving your furniture around.  If any of your floors are uneven, fix them.  If there are any pets around you, be aware of where they are.  Review your medicines with your doctor. Some medicines can make you feel dizzy. This can increase your chance of falling. Ask your doctor what other things that you can do to help prevent falls. This information is not intended to replace advice given to you by your health care provider. Make sure you discuss any questions you have with your health care provider. Document Released: 10/02/2009 Document Revised: 05/13/2016 Document Reviewed: 01/10/2015 Elsevier Interactive Patient Education  2017 [employer].

## 2018-10-13 IMAGING — CT CT ABD-PELV W/ CM
2 of 5 series · 16 of 46 positions shown, 18 images · IV contrast (ISOVUE)
Comparison: 09/06/2016

CLINICAL DATA: Hematuria

EXAM:
CT ABDOMEN AND PELVIS WITH CONTRAST
TECHNIQUE: Multidetector CT imaging of the abdomen and pelvis was performed
using the standard protocol following bolus administration of
intravenous contrast.
CONTRAST:  100mL [id number] IOPAMIDOL ([id number]) INJECTION 61%

[Series 2: axial st · axial · 0.88mm/px · z∈[+1050,+1440]mm · 13 of 91 slices shown, 15 images]
[im 7/91  soft-tissue]
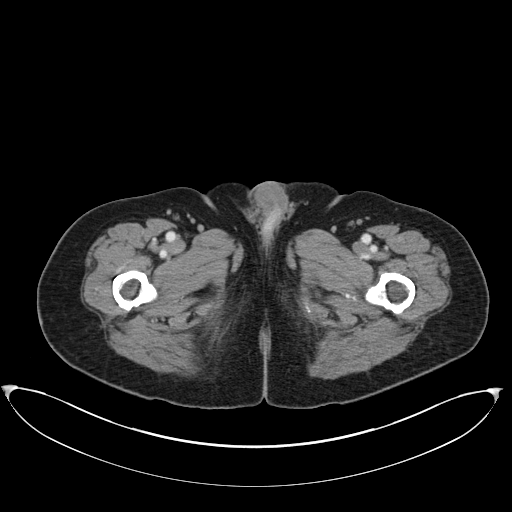
[im 7/91  bone]
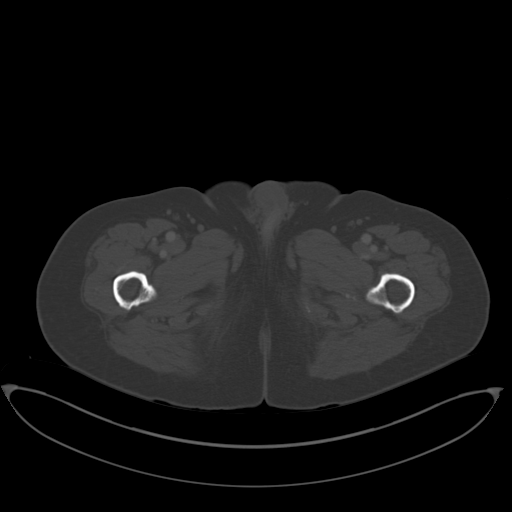
[im 13/91  soft-tissue]
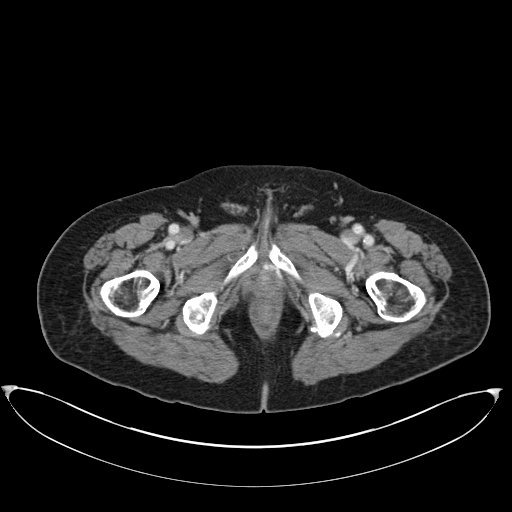
[im 19/91  soft-tissue]
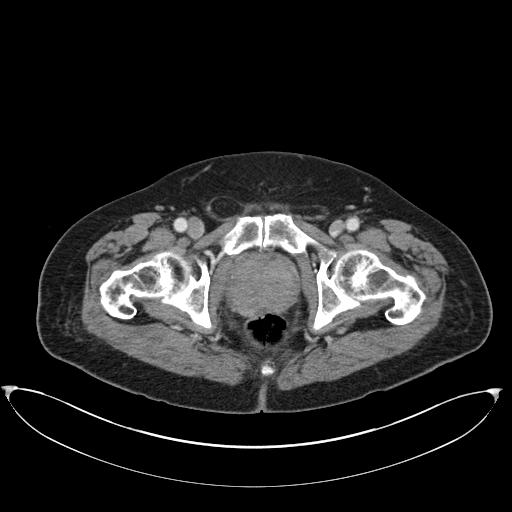
[im 25/91  soft-tissue]
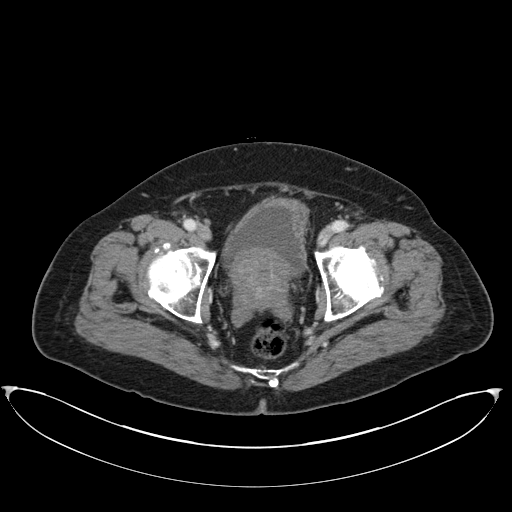
[im 31/91  soft-tissue]
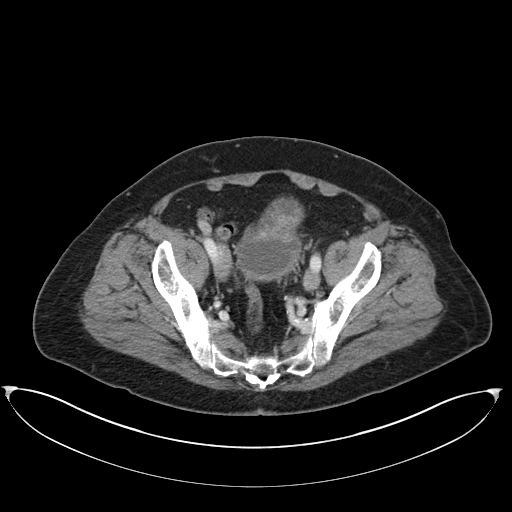
[im 37/91  soft-tissue]
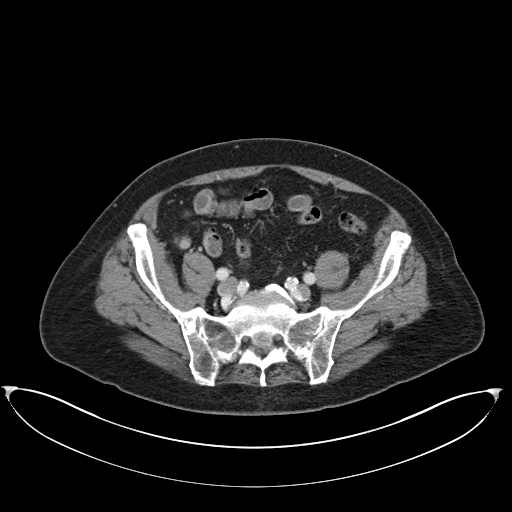
[im 49/91  soft-tissue]
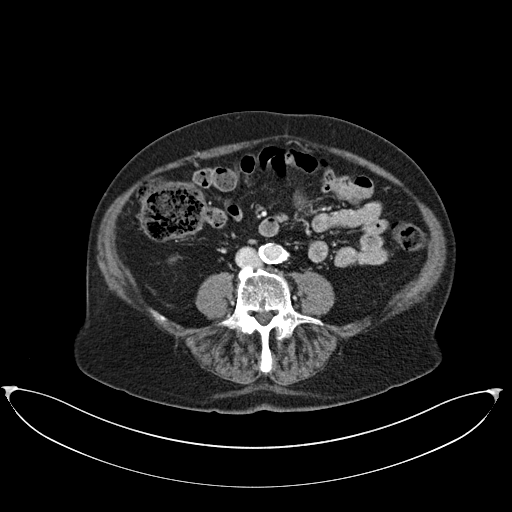
[im 55/91  soft-tissue]
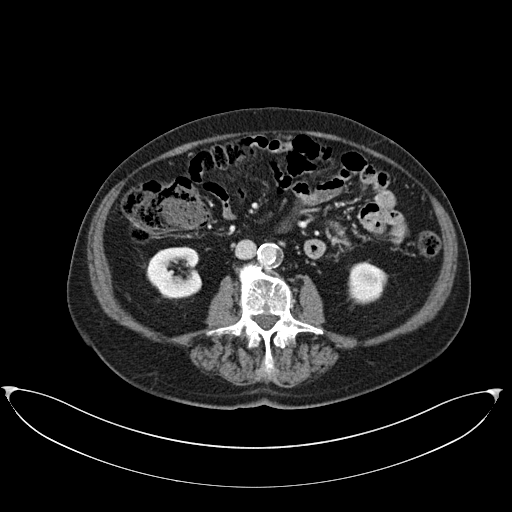
[im 61/91  soft-tissue]
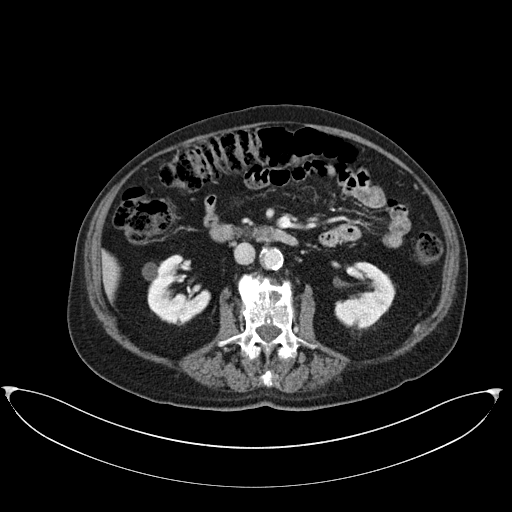
[im 61/91  bone]
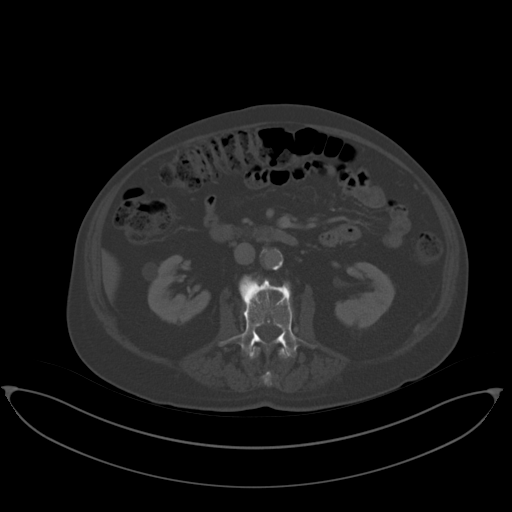
[im 67/91  soft-tissue]
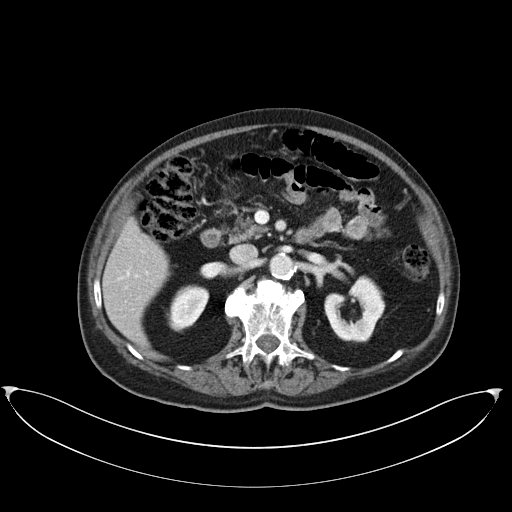
[im 73/91  soft-tissue]
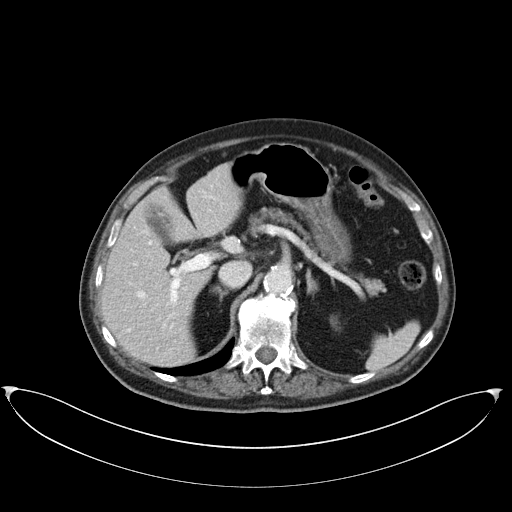
[im 79/91  soft-tissue]
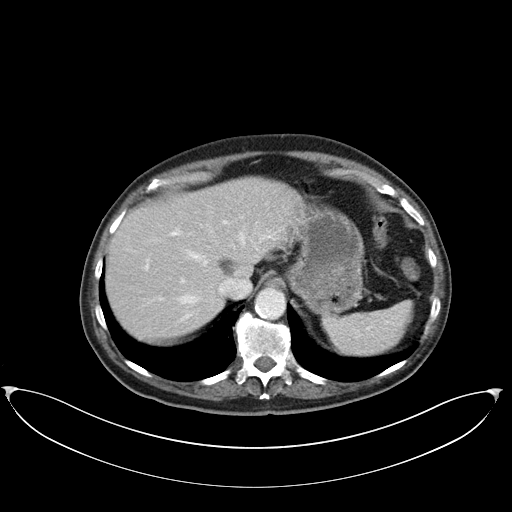
[im 85/91  soft-tissue]
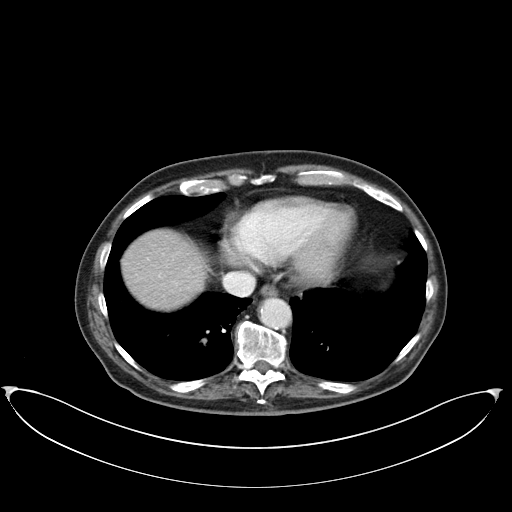

[Series 5: coronal st · coronal · 0.74mm/px · 3 of 96 slices shown]
[im 32/96  soft-tissue]
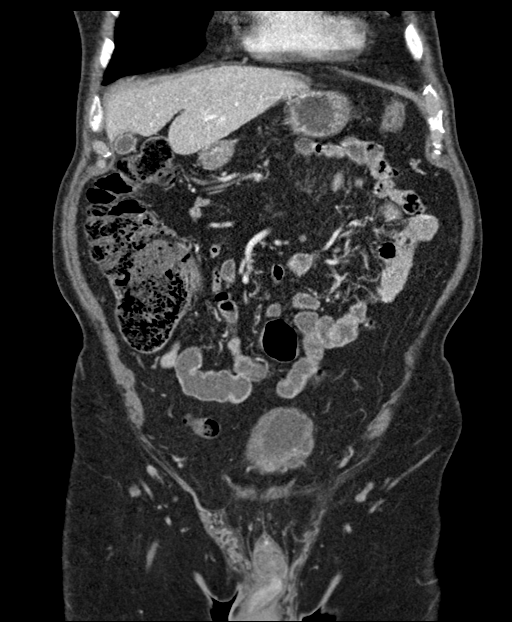
[im 43/96  soft-tissue]
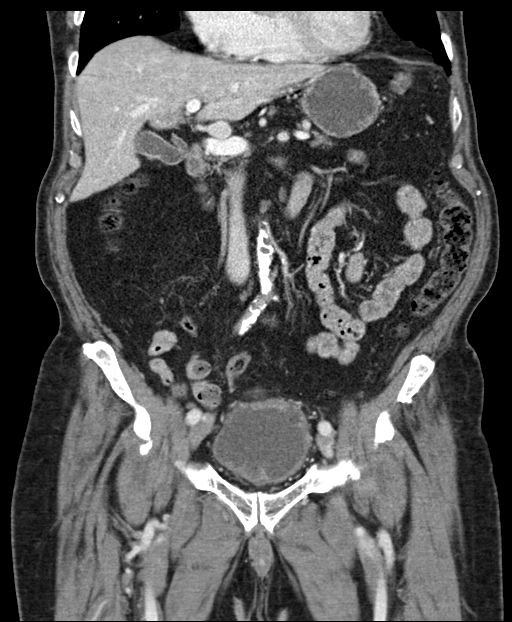
[im 53/96  soft-tissue]
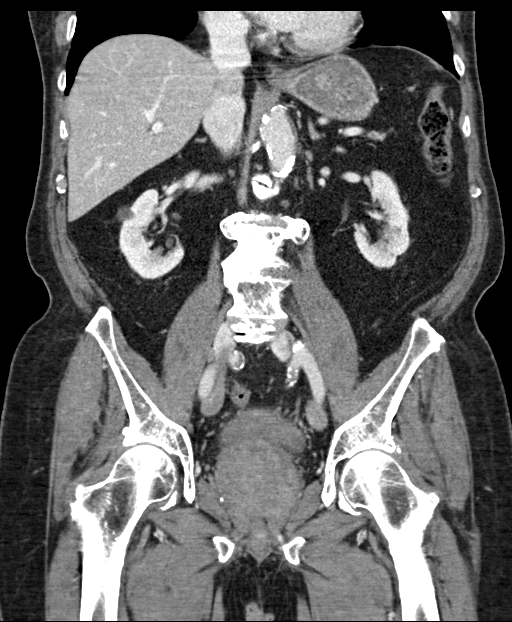

[16 of 46 positions shown; findings below may reference images not displayed]

FINDINGS: Lower chest: Lung bases demonstrate no acute consolidation or
pleural effusion. Heart size within normal limits

Hepatobiliary: Stable cyst in the central liver. No calcified
gallstones or biliary dilatation

Pancreas: Unremarkable. No pancreatic ductal dilatation or
surrounding inflammatory changes.

Spleen: Normal in size without focal abnormality.

Adrenals/Urinary Tract: Adrenal glands are normal. Small cysts
within the kidneys. Subcentimeter lesions too small to further
characterize. No hydronephrosis. Irregular bladder wall thickening
with cystic changes of the anterior bladder wall.

Stomach/Bowel: Stomach is within normal limits. Appendix not well
seen but no right lower quadrant inflammation. No evidence of bowel
wall thickening, distention, or inflammatory changes.

Vascular/Lymphatic: Marked aortic atherosclerosis. No aneurysmal
dilatation. No significantly enlarged lymph nodes.

Reproductive: Enlarged prostate gland with mass effect on the
bladder.

Other: Negative for free air or free fluid. Small fat in the right
inguinal canal

Musculoskeletal: Degenerative changes of the spine. No acute or
suspicious bone lesion.
IMPRESSION: 1. Prostatomegaly with mass effect on the posterior bladder.
Irregular wall thickening and trabeculation of the bladder similar
to the previous CT, this could be due to chronic obstruction
(favored) or possibly cystitis.
2. There are no other acute abnormalities visualized.

## 2018-10-23 ENCOUNTER — Other Ambulatory Visit: Payer: Self-pay | Admitting: *Deleted

## 2018-10-23 DIAGNOSIS — Z794 Long term (current) use of insulin: Principal | ICD-10-CM

## 2018-10-23 DIAGNOSIS — E119 Type 2 diabetes mellitus without complications: Secondary | ICD-10-CM

## 2018-10-23 MED ORDER — SITAGLIPTIN PHOSPHATE 100 MG PO TABS: ORAL_TABLET | ORAL | 0 refills | Status: DC

## 2018-10-23 NOTE — Telephone Encounter (Signed)
[facility] [employer] 

## 2018-11-22 DIAGNOSIS — H04123 Dry eye syndrome of bilateral lacrimal glands: Secondary | ICD-10-CM | POA: Diagnosis not present

## 2018-11-22 DIAGNOSIS — H401131 Primary open-angle glaucoma, bilateral, mild stage: Secondary | ICD-10-CM | POA: Diagnosis not present

## 2019-01-17 DIAGNOSIS — Z85828 Personal history of other malignant neoplasm of skin: Secondary | ICD-10-CM | POA: Diagnosis not present

## 2019-02-25 ENCOUNTER — Other Ambulatory Visit: Payer: Self-pay | Admitting: Family

## 2019-02-25 DIAGNOSIS — E119 Type 2 diabetes mellitus without complications: Secondary | ICD-10-CM

## 2019-02-25 DIAGNOSIS — Z794 Long term (current) use of insulin: Principal | ICD-10-CM

## 2019-02-26 ENCOUNTER — Other Ambulatory Visit: Payer: Self-pay

## 2019-02-26 DIAGNOSIS — Z794 Long term (current) use of insulin: Principal | ICD-10-CM

## 2019-02-26 DIAGNOSIS — E119 Type 2 diabetes mellitus without complications: Secondary | ICD-10-CM

## 2019-02-26 MED ORDER — SITAGLIPTIN PHOSPHATE 100 MG PO TABS: ORAL_TABLET | ORAL | 1 refills | Status: DC

## 2019-03-16 DIAGNOSIS — I10 Essential (primary) hypertension: Secondary | ICD-10-CM | POA: Diagnosis not present

## 2019-03-16 DIAGNOSIS — E782 Mixed hyperlipidemia: Secondary | ICD-10-CM | POA: Diagnosis not present

## 2019-03-16 DIAGNOSIS — E118 Type 2 diabetes mellitus with unspecified complications: Secondary | ICD-10-CM | POA: Diagnosis not present

## 2019-03-16 DIAGNOSIS — E538 Deficiency of other specified B group vitamins: Secondary | ICD-10-CM | POA: Diagnosis not present

## 2019-03-19 ENCOUNTER — Encounter: Payer: Self-pay | Admitting: *Deleted

## 2019-03-19 ENCOUNTER — Other Ambulatory Visit: Payer: Self-pay

## 2019-03-19 DIAGNOSIS — I10 Essential (primary) hypertension: Secondary | ICD-10-CM

## 2019-03-19 DIAGNOSIS — E782 Mixed hyperlipidemia: Secondary | ICD-10-CM

## 2019-03-19 DIAGNOSIS — E118 Type 2 diabetes mellitus with unspecified complications: Secondary | ICD-10-CM

## 2019-03-19 DIAGNOSIS — E538 Deficiency of other specified B group vitamins: Secondary | ICD-10-CM

## 2019-03-20 LAB — CBC WITH DIFFERENTIAL/PLATELET
ABSOLUTE MONOCYTES: 737 {cells}/uL (ref 200–950)
BASOS PCT: 0.9 %
Basophils Absolute: 50 cells/uL (ref 0–200)
EOS PCT: 1.4 %
Eosinophils Absolute: 77 cells/uL (ref 15–500)
HCT: 36.2 % — ABNORMAL LOW (ref 38.5–50.0)
Hemoglobin: 12.5 g/dL — ABNORMAL LOW (ref 13.2–17.1)
Lymphs Abs: 1425 cells/uL (ref 850–3900)
MCH: 34.3 pg — ABNORMAL HIGH (ref 27.0–33.0)
MCHC: 34.5 g/dL (ref 32.0–36.0)
MCV: 99.5 fL (ref 80.0–100.0)
MONOS PCT: 13.4 %
MPV: 9.7 fL (ref 7.5–12.5)
NEUTROS PCT: 58.4 %
Neutro Abs: 3212 cells/uL (ref 1500–7800)
PLATELETS: 259 10*3/uL (ref 140–400)
RBC: 3.64 10*6/uL — AB (ref 4.20–5.80)
RDW: 17.5 % — AB (ref 11.0–15.0)
TOTAL LYMPHOCYTE: 25.9 %
WBC: 5.5 10*3/uL (ref 3.8–10.8)

## 2019-03-20 LAB — COMPLETE METABOLIC PANEL WITH GFR
AG Ratio: 1.9 (calc) (ref 1.0–2.5)
ALT: 7 U/L — AB (ref 9–46)
AST: 13 U/L (ref 10–35)
Albumin: 4 g/dL (ref 3.6–5.1)
Alkaline phosphatase (APISO): 64 U/L (ref 35–144)
BUN: 20 mg/dL (ref 7–25)
CALCIUM: 8.5 mg/dL — AB (ref 8.6–10.3)
CO2: 28 mmol/L (ref 20–32)
Chloride: 107 mmol/L (ref 98–110)
Creat: 0.87 mg/dL (ref 0.70–1.11)
GFR, EST AFRICAN AMERICAN: 87 mL/min/{1.73_m2} (ref >=60)
GFR, Est Non African American: 75 mL/min/{1.73_m2} (ref >=60)
GLUCOSE: 190 mg/dL — AB (ref 65–99)
Globulin: 2.1 g/dL (calc) (ref 1.9–3.7)
POTASSIUM: 3.7 mmol/L (ref 3.5–5.3)
Sodium: 142 mmol/L (ref 135–146)
TOTAL PROTEIN: 6.1 g/dL (ref 6.1–8.1)
Total Bilirubin: 0.7 mg/dL (ref 0.2–1.2)

## 2019-03-20 LAB — HEMOGLOBIN A1C
EAG (MMOL/L): 8.2 (calc)
Hgb A1c MFr Bld: 6.8 % of total Hgb — ABNORMAL HIGH (ref <5.7)
Mean Plasma Glucose: 148 (calc)

## 2019-03-20 LAB — LIPID PANEL
CHOLESTEROL: 127 mg/dL (ref <200)
HDL: 52 mg/dL (ref >=40)
LDL Cholesterol (Calc): 59 mg/dL (calc)
NON-HDL CHOLESTEROL (CALC): 75 mg/dL (ref <130)
TRIGLYCERIDES: 84 mg/dL (ref <150)
Total CHOL/HDL Ratio: 2.4 (calc) (ref <5.0)

## 2019-03-20 LAB — VITAMIN B12: Vitamin B-12: 790 pg/mL (ref 200–1100)

## 2019-03-27 ENCOUNTER — Encounter: Payer: Self-pay | Admitting: Family

## 2019-03-28 ENCOUNTER — Encounter: Payer: Self-pay | Admitting: Family

## 2019-03-28 ENCOUNTER — Non-Acute Institutional Stay: Payer: Medicare Other | Admitting: Family

## 2019-03-28 ENCOUNTER — Other Ambulatory Visit: Payer: Self-pay

## 2019-03-28 VITALS — BP 118/62 | HR 83 | Temp 97.6°F | Ht 66.0 in | Wt 152.8 lb

## 2019-03-28 DIAGNOSIS — R42 Dizziness and giddiness: Secondary | ICD-10-CM

## 2019-03-28 DIAGNOSIS — E538 Deficiency of other specified B group vitamins: Secondary | ICD-10-CM

## 2019-03-28 DIAGNOSIS — E782 Mixed hyperlipidemia: Secondary | ICD-10-CM | POA: Diagnosis not present

## 2019-03-28 DIAGNOSIS — E118 Type 2 diabetes mellitus with unspecified complications: Secondary | ICD-10-CM

## 2019-03-28 DIAGNOSIS — I10 Essential (primary) hypertension: Secondary | ICD-10-CM

## 2019-03-28 DIAGNOSIS — Z0000 Encounter for general adult medical examination without abnormal findings: Secondary | ICD-10-CM | POA: Diagnosis not present

## 2019-03-28 NOTE — Progress Notes (Signed)
Location:  [facility] (12) Provider:   FNP-C  , Nelda Bucks, NP  Patient Care Team: , Nelda Bucks, NP as PCP - General (Family Medicine) Clent Jacks, MD as Consulting Physician (Ophthalmology) Lindwood Coke, MD as Consulting Physician (Dermatology) Thornell Sartorius, MD as Consulting Physician (Otolaryngology) Richmond Campbell, MD as Consulting Physician (Gastroenterology) Terrell Hills, [facility] Wardell Honour, MD as Attending Physician [facility])  Extended Emergency Contact Information Primary Emergency Contact: Therien,Peggy Address: [id number]          [place] [address] [place] Phone: [phone] Relation: Spouse Secondary Emergency Contact: Chike, [place] [facility] Phone: [phone] Mobile Phone: [phone] Relation: Daughter  Code Status:  DNR Goals of care: Advanced Directive information Advanced Directives 03/28/2019  Does Patient Have a Medical Advance Directive? Yes  Type of Paramedic of [place];Out of facility DNR (pink MOST or yellow form);Living will  Does patient want to make changes to medical advance directive? No - Patient declined  Copy of [place] in Chart? Yes - validated most recent copy scanned in chart (See row information)  Would patient like information on creating a medical advance directive? -  Pre-existing out of facility DNR order (yellow form or pink MOST form) Pink MOST form placed in chart (order not valid for inpatient use)     Chief Complaint  Patient presents with  . Medical Management of Chronic Issues    physical ,mmse score 28,phq9 score 0    HPI:  Pt is a 85 y.o. male seen today at [facility] clinic for an acute visit for Physical exam.He has a medical history of Hypertension,Type 2 DM,Vertigo,BPH,Hyperlipidemia among other conditions.He denies any acute issues during visit.He  has had no recent hospitalization,fall or changes in weight  Type 2 DM- he states does not check his blood sugars on a regular basis does not like the sharp pricking of the needle.He checked it recently CBG was 111.Recent Hgb A1C reviewed was stable 6.8 (03/16/19).He is compliant with his diabetic diet.Denies any signs of hypo/hyperglycemia.on Januvia 100 mg tablet daily and Actos 45 mg tablet daily.    Hypertension - Has occasional dizziness though chronic due to vertigo.Denies any headache or changes in vision.current off blood pressure medication.takes Baby ASA daily.  Vertigo - has meclizine 25 mg tablet one by mouth twice daily as needed.He states has not had any symptoms for a very long time but carries medication in his pocket just in case.describes symptoms as occasional.   Vitamin B12 -Recent lab recent reviewed within lower normal range.on Vitamin B12 1000 mcg tablet daily.    Hyperlipidemia - Labs reviewed within normal range.States tries to eat heart healthy diet.He is not on any cholesterol medication.   Anemia - Hgb 12.5 (03/16/2019) previous 13.1;11.2 he denies any active bleeding or blood in the stool. Also denies any fatigue.   Physical exam - up to date with immunization.Denies any recent fall episode.He denies feelings of depression.  Past Medical History:  Diagnosis Date  . Abdominal pain, other specified site   . Allergic rhinitis due to pollen   . Benign paroxysmal positional vertigo   . Disturbance of skin sensation    left great toe  . Elevated prostate specific antigen (PSA)   . Hematuria, unspecified   . Hypertrophy of prostate without urinary obstruction and other lower urinary tract symptoms (LUTS)   . Impotence of organic origin   .  Left knee pain 05/28/2014  . Macrocytosis   . Other and unspecified hyperlipidemia   . Other malaise and fatigue   . Spermatocele    bilateral  . Type II or unspecified type diabetes mellitus without mention of complication,  uncontrolled   . Unspecified essential hypertension   . Unspecified glaucoma(365.9)    Past Surgical History:  Procedure Laterality Date  . CATARACT EXTRACTION EXTRACAPSULAR  2008   bilateraly Dr. Katy Fitch  . COLONOSCOPY  01/03/2002   normal Dr. Earlean Shawl  . PROSTATE BIOPSY  1995   due to elevated PSA normal  . TONSILLECTOMY      Allergies  Allergen Reactions  . Metformin And Related Other (See Comments)    unknown  . Sulfa Antibiotics Swelling  . Tetanus Toxoids Other (See Comments)    unknown  . Typhoid Vaccines Other (See Comments)    unknown    Outpatient Encounter Medications as of 03/28/2019  Medication Sig  . acetaminophen (TYLENOL) 500 MG tablet Take 1 tablet (500 mg total) by mouth 2 (two) times daily. (Patient taking differently: Take 500 mg by mouth 2 (two) times daily as needed. )  . aspirin 81 MG tablet Take 81 mg by mouth daily.  . meclizine (ANTIVERT) 25 MG tablet Take 1 tablet (25 mg total) by mouth 2 (two) times daily as needed for dizziness.  . pioglitazone (ACTOS) 45 MG tablet TAKE 1 TABLET BY MOUTH EVERY DAY TO CONTROL BLOOD SUGAR  . sitaGLIPtin (JANUVIA) 100 MG tablet TAKE ONE TABLET BY MOUTH ONCE EVERY MORNING TO CONTROL DIABETES E11.9 Z79.4  . vitamin B-12 (CYANOCOBALAMIN) 1000 MCG tablet Take 1 tablet (1,000 mcg total) by mouth daily.  Marland Kitchen glucose blood test strip Use one strip to test blood sugar once a day before breakfast . Dx E11.9 (Patient not taking: Reported on 03/28/2019)  . MICROLET LANCETS MISC Use one lancet each morning to test blood sugar. Dx. E11.9 (Patient not taking: Reported on 09/06/2018)  . [DISCONTINUED] Selenium 200 MCG TABS Take 1 tablet by mouth daily.    No facility-administered encounter medications on file as of 03/28/2019.     Review of Systems  Constitutional: Negative for activity change, appetite change, chills, fatigue, fever and unexpected weight change.  HENT: Negative for congestion, rhinorrhea, sinus pressure, sinus pain, sneezing  and sore throat.   Eyes: Positive for visual disturbance. Negative for pain, discharge, redness and itching.       Wears eye glasses   Respiratory: Negative for cough, chest tightness, shortness of breath and wheezing.   Cardiovascular: Negative for chest pain, palpitations and leg swelling.  Gastrointestinal: Negative for abdominal distention, abdominal pain, blood in stool, constipation, diarrhea, nausea and vomiting.  Endocrine: Negative for cold intolerance, heat intolerance, polydipsia, polyphagia and polyuria.  Genitourinary: Negative for difficulty urinating, dysuria, flank pain and urgency.  Musculoskeletal: Positive for arthralgias. Negative for gait problem.  Skin: Negative for color change, pallor, rash and wound.  Neurological: Negative for syncope, light-headedness and headaches.       Occasional dizziness but none in a long time   Psychiatric/Behavioral: Negative for agitation, confusion and sleep disturbance. The patient is not nervous/anxious.     Immunization History  Administered Date(s) Administered  . Influenza Whole 09/19/2012, 09/20/2013  . Influenza, High Dose Seasonal PF 09/28/2017  . Influenza,inj,Quad PF,6+ Mos 09/21/2018  . Influenza-Unspecified 10/03/2014, 09/18/2015, 09/30/2016  . Pneumococcal Conjugate-13 08/27/2017  . Pneumococcal Polysaccharide-23 10/24/2001   Pertinent  Health Maintenance Due  Topic Date Due  . FOOT  EXAM  11/09/2018  . URINE MICROALBUMIN  05/26/2019  . OPHTHALMOLOGY EXAM  05/17/2019  . INFLUENZA VACCINE  07/21/2019  . HEMOGLOBIN A1C  09/16/2019  . PNA vac Low Risk Adult  Completed   Fall Risk  03/28/2019 10/04/2018 09/06/2018 08/26/2017 07/27/2016  Falls in the past year? 0 No No No No  Number falls in past yr: 0 - - - -  Injury with Fall? 0 - - - -    Vitals:   03/28/19 1353  BP: 118/62  Pulse: 83  Temp: 97.6 F (36.4 C)  TempSrc: Oral  SpO2: 96%  Weight: 152 lb 12.8 oz (69.3 kg)  Height: 5\' 6"  (1.676 m)   Body mass index  is 24.66 kg/m. Physical Exam Vitals signs reviewed.  Constitutional:      General: He is not in acute distress.    Appearance: He is normal weight. He is not ill-appearing.  HENT:     Head: Normocephalic.     Right Ear: There is impacted cerumen.     Left Ear: Tympanic membrane, ear canal and external ear normal. There is no impacted cerumen.     Nose: Nose normal. No congestion or rhinorrhea.     Mouth/Throat:     Mouth: Mucous membranes are moist.     Pharynx: Oropharynx is clear. No oropharyngeal exudate or posterior oropharyngeal erythema.  Eyes:     General: No scleral icterus.       Right eye: No discharge.        Left eye: No discharge.     Conjunctiva/sclera: Conjunctivae normal.     Pupils: Pupils are equal, round, and reactive to light.  Neck:     Musculoskeletal: Normal range of motion. No neck rigidity or muscular tenderness.     Vascular: No carotid bruit.  Cardiovascular:     Rate and Rhythm: Normal rate and regular rhythm.     Pulses: Normal pulses.     Heart sounds: Normal heart sounds. No murmur. No friction rub. No gallop.   Pulmonary:     Effort: Pulmonary effort is normal. No respiratory distress.     Breath sounds: Normal breath sounds. No wheezing, rhonchi or rales.  Chest:     Chest wall: No tenderness.  Abdominal:     General: Bowel sounds are normal. There is no distension.     Palpations: Abdomen is soft. There is no mass.     Tenderness: There is no abdominal tenderness. There is no right CVA tenderness, left CVA tenderness, guarding or rebound.  Genitourinary:    Prostate: Enlarged. Not tender and no nodules present.     Rectum: Normal. Guaiac result negative. No mass, tenderness, anal fissure, external hemorrhoid or internal hemorrhoid. Normal anal tone.  Musculoskeletal:        General: No swelling or tenderness.     Right lower leg: No edema.     Left lower leg: No edema.     Comments: FROM except chronic limited ROM on  right shoulder due to  arthritc pain.    Lymphadenopathy:     Cervical: No cervical adenopathy.  Skin:    General: Skin is warm and dry.     Coloration: Skin is not pale.     Findings: No bruising, erythema or rash.  Neurological:     Mental Status: He is alert and oriented to person, place, and time.     Cranial Nerves: No cranial nerve deficit.     Sensory: No sensory deficit.  Motor: No weakness.     Coordination: Coordination normal.     Gait: Gait normal.  Psychiatric:        Mood and Affect: Mood normal.        Speech: Speech normal.        Behavior: Behavior normal.        Thought Content: Thought content normal.        Judgment: Judgment normal.     Comments: Scored 28/30 on MMSE this visit.     Labs reviewed: Recent Labs    05/25/18 0000 08/30/18 0000 03/16/19 0000  NA 143 141 142  K 4.1 4.3 3.7  CL 107 106 107  CO2 29 28 28   GLUCOSE 129* 108* 190*  BUN 17 21 20   CREATININE 0.70 0.82 0.87  CALCIUM 8.6 9.0 8.5*   Recent Labs    05/25/18 0000 08/30/18 0000 03/16/19 0000  AST 15 15 13   ALT 7* 8* 7*  BILITOT 0.6 0.9 0.7  PROT 6.4 6.4 6.1   Recent Labs    05/25/18 0000 03/16/19 0000  WBC 5.4 5.5  NEUTROABS  --  3,212  HGB 13.1* 12.5*  HCT 38.2* 36.2*  MCV 95.3 99.5  PLT 268 259   Lab Results  Component Value Date   TSH 2.67 01/30/2018   Lab Results  Component Value Date   HGBA1C 6.8 (H) 03/16/2019   Lab Results  Component Value Date   CHOL 127 03/16/2019   HDL 52 03/16/2019   LDLCALC 59 03/16/2019   TRIG 84 03/16/2019   CHOLHDL 2.4 03/16/2019    Significant Diagnostic Results in last 30 days:  No results found.  Assessment/Plan 1. Type 2 diabetes mellitus with complication, without long-term current use of insulin (HCC) Lab Results  Component Value Date   HGBA1C 6.8 (H) 03/16/2019  No CBG log for review states does not check CBG on a regular basis recall recent reading was 111 encouraged to check CBG at least even once a week. - Continue on Januvia  100 mg tablet daily and Actos 45 mg tablet daily.continue on ASA 81 mg tablet daily.  - Recommend annual foot exam with podiatrist and eye exam with Ophthalmology patient to make appointment once Quarantine measure are cleared due to COVID-19 - Hgb A1C in 6 months.     2. Essential hypertension B/p stable.Not on b/p medication.continue on ASA 81 mg tablet for cardiovascular event prophylaxis.  - CBC/diff,CMP,TSH level in 6 months.   3. B12 deficiency Recent labs shows low normal range levels.continue on Vit B12 1000 mcg tablet daily.   4. Vertigo Has occasional dizziness but none for a while.continue on meclizine 25 mg tablet one by mouth twice daily as needed.encouraged fluid intake.   5. Anemia  Hgb 12.5 (03/16/2019) previous 13.1;11.2Asymptomatic.Stool negative for occult blood this visit. Continue to monitor H/H - CBC/diff in 6 months.   6. Hyperlipidemia  LDL at goal for Type 2 DM.currently off statin.continue on heart healthy diet. - Lipid panel in 6 months.   7. Physical Exam  Up to date with immunization.Medication and labs reviewed.Patient counseled regarding low carbohydrate,low saturated fats diet and exercise at least three times per week as tolerated to keep blood sugars and cholesterol down.Yearly eye,foot exam and dental exam.Fall and safety precaution.Fall screening,MMSE scored 28/30,PHQ 9 updated.   Family/ staff Communication: Reviewed plan of care with patient.  Labs/tests ordered: CBC/diff,CMP,Hgb A1C,TSH level in 6 months prior to next visit.   Follow up: 6 months for medical  management of chronic issues   Sandrea Hughs, NP

## 2019-04-02 NOTE — Addendum Note (Signed)
Addended byMarlowe Sax C on: 04/02/2019 06:22 PM   Modules accepted: Level of Service

## 2019-04-10 ENCOUNTER — Other Ambulatory Visit: Payer: Self-pay

## 2019-04-10 ENCOUNTER — Ambulatory Visit (INDEPENDENT_AMBULATORY_CARE_PROVIDER_SITE_OTHER): Payer: Medicare Other | Admitting: Family

## 2019-04-10 VITALS — BP 127/65 | HR 84 | Temp 97.4°F | Ht 66.0 in | Wt 152.0 lb

## 2019-04-10 DIAGNOSIS — E119 Type 2 diabetes mellitus without complications: Secondary | ICD-10-CM

## 2019-04-10 DIAGNOSIS — M25472 Effusion, left ankle: Secondary | ICD-10-CM

## 2019-04-10 DIAGNOSIS — I10 Essential (primary) hypertension: Secondary | ICD-10-CM | POA: Diagnosis not present

## 2019-04-10 DIAGNOSIS — Z794 Long term (current) use of insulin: Secondary | ICD-10-CM

## 2019-04-10 MED ORDER — SITAGLIPTIN PHOSPHATE 100 MG PO TABS: ORAL_TABLET | ORAL | 1 refills | Status: DC

## 2019-04-10 NOTE — Progress Notes (Signed)
This service is provided via telemedicine  No vital signs collected/recorded due to the encounter was a telemedicine visit.   Location of patient (ex: home, work):  [place] apartment   Patient consents to a telephone visit:  Yes   Location of the provider (ex: office, home):  Office    Names of all persons participating in the telemedicine service and their role in the encounter:  Ruthell Rummage CMA, Massimo Hartland NP, Jeneen Rinks Strojny   Time spent on call:  Jerral Ralph CMA spent  6 Minutes with patient on phone    Provider: Mohan Erven FNP-C  Goals of Care:  Advanced Directives 04/10/2019  Does Patient Have a Medical Advance Directive? Yes  Type of Paramedic of [place];Out of facility DNR (pink MOST or yellow form);Living will  Does patient want to make changes to medical advance directive? No - Patient declined  Copy of [place] in Chart? Yes - validated most recent copy scanned in chart (See row information)  Would patient like information on creating a medical advance directive? -  Pre-existing out of facility DNR order (yellow form or pink MOST form) Pink MOST form placed in chart (order not valid for inpatient use)     Chief Complaint  Patient presents with  . Acute Visit    left ankle swelling duration a couple days ago or patient states he just noted   . Medication Management    patient states he no longer checks blood sugar please advise     HPI: Patient is a 85 y.o. male seen today for an acute visit for left ankle swelling for a couple of days.He states swelling gets better whenever he elevate the left leg and in the mornings.He denies any pain,redness,tenderness or warmer than other leg.He also denies any history of gout, injuries or strain to left ankle.  He would also like provider to go over use of his new blood pressure cuff.discussed use of blood pressure machine.Patient able to check B/P without any difficulties.He  denies any headache,dizzines,shortness of breath or chest pain.   Past Medical History:  Diagnosis Date  . Abdominal pain, other specified site   . Allergic rhinitis due to pollen   . Benign paroxysmal positional vertigo   . Disturbance of skin sensation    left great toe  . Elevated prostate specific antigen (PSA)   . Hematuria, unspecified   . Hypertrophy of prostate without urinary obstruction and other lower urinary tract symptoms (LUTS)   . Impotence of organic origin   . Left knee pain 05/28/2014  . Macrocytosis   . Other and unspecified hyperlipidemia   . Other malaise and fatigue   . Spermatocele    bilateral  . Type II or unspecified type diabetes mellitus without mention of complication, uncontrolled   . Unspecified essential hypertension   . Unspecified glaucoma(365.9)     Past Surgical History:  Procedure Laterality Date  . CATARACT EXTRACTION EXTRACAPSULAR  2008   bilateraly Dr. Katy Fitch  . COLONOSCOPY  01/03/2002   normal Dr. Earlean Shawl  . PROSTATE BIOPSY  1995   due to elevated PSA normal  . TONSILLECTOMY      Allergies  Allergen Reactions  . Metformin And Related Other (See Comments)    unknown  . Sulfa Antibiotics Swelling  . Tetanus Toxoids Other (See Comments)    unknown  . Typhoid Vaccines Other (See Comments)    unknown    Outpatient Encounter Medications as of 04/10/2019  Medication Sig  . acetaminophen (TYLENOL) 500 MG tablet Take 500 mg by mouth 2 (two) times daily as needed.  Marland Kitchen aspirin 81 MG tablet Take 81 mg by mouth daily.  . meclizine (ANTIVERT) 25 MG tablet Take 1 tablet (25 mg total) by mouth 2 (two) times daily as needed for dizziness.  . pioglitazone (ACTOS) 45 MG tablet TAKE 1 TABLET BY MOUTH EVERY DAY TO CONTROL BLOOD SUGAR  . sitaGLIPtin (JANUVIA) 100 MG tablet TAKE ONE TABLET BY MOUTH ONCE EVERY MORNING TO CONTROL DIABETES E11.9 Z79.4  . vitamin B-12 (CYANOCOBALAMIN) 1000 MCG tablet Take 1 tablet (1,000 mcg total) by mouth daily.  Marland Kitchen  glucose blood test strip Use one strip to test blood sugar once a day before breakfast . Dx E11.9 (Patient not taking: Reported on 03/28/2019)  . MICROLET LANCETS MISC Use one lancet each morning to test blood sugar. Dx. E11.9 (Patient not taking: Reported on 09/06/2018)  . [DISCONTINUED] acetaminophen (TYLENOL) 500 MG tablet Take 1 tablet (500 mg total) by mouth 2 (two) times daily. (Patient taking differently: Take 500 mg by mouth 2 (two) times daily as needed. )   No facility-administered encounter medications on file as of 04/10/2019.     Review of Systems:  Review of Systems  Constitutional: Negative for chills, fatigue, fever and unexpected weight change.  Respiratory: Negative for cough, chest tightness, shortness of breath and wheezing.   Cardiovascular: Negative for chest pain, palpitations and leg swelling.       Except left ankle swelling per HPI   Endocrine: Negative for polydipsia, polyphagia and polyuria.  Musculoskeletal: Negative for arthralgias.       Left ankle swelling per HPI   Skin: Negative for color change, pallor, rash and wound.  Neurological: Negative for dizziness, light-headedness and headaches.       Chronic intermittent vertigo but none recently.    Health Maintenance  Topic Date Due  . URINE MICROALBUMIN  05/26/2019  . FOOT EXAM  05/19/2019 (Originally 11/09/2018)  . TETANUS/TDAP  09/07/2019 (Originally 10/07/1943)  . OPHTHALMOLOGY EXAM  05/17/2019  . INFLUENZA VACCINE  07/21/2019  . HEMOGLOBIN A1C  09/16/2019  . PNA vac Low Risk Adult  Completed    Vitals:   04/10/19 0851  BP: 127/65  Pulse: 84  Temp: (!) 97.4 F (36.3 C)  Weight: 152 lb (68.9 kg)  Height: 5\' 6"  (1.676 m)   Body mass index is 24.53 kg/m. Physical Exam Vitals signs (patient checked Temp,B/p and weight at home) reviewed.  Musculoskeletal:     Comments: Reported no swelling on left ankle this morning,no redness or tenderness.  Skin:    Comments: Reported no increased warmness  of left ankle.   Neurological:     Mental Status: He is alert and oriented to person, place, and time.  Psychiatric:        Mood and Affect: Mood normal.        Judgment: Judgment normal.   Unable to complete over the phone visit.   Labs reviewed: Basic Metabolic Panel: Recent Labs    05/25/18 0000 08/30/18 0000 03/16/19 0000  NA 143 141 142  K 4.1 4.3 3.7  CL 107 106 107  CO2 29 28 28   GLUCOSE 129* 108* 190*  BUN 17 21 20   CREATININE 0.70 0.82 0.87  CALCIUM 8.6 9.0 8.5*   Liver Function Tests: Recent Labs    05/25/18 0000 08/30/18 0000 03/16/19 0000  AST 15 15 13   ALT 7* 8* 7*  BILITOT 0.6  0.9 0.7  PROT 6.4 6.4 6.1   CBC: Recent Labs    05/25/18 0000 03/16/19 0000  WBC 5.4 5.5  NEUTROABS  --  3,212  HGB 13.1* 12.5*  HCT 38.2* 36.2*  MCV 95.3 99.5  PLT 268 259   Lipid Panel: Recent Labs    08/30/18 0000 03/16/19 0000  CHOL 139 127  HDL 57 52  LDLCALC 67 59  TRIG 72 84  CHOLHDL 2.4 2.4   Lab Results  Component Value Date   HGBA1C 6.8 (H) 03/16/2019    Procedures since last visit: No results found.  Assessment/Plan  1. Left ankle swelling Afebrile.sweling improves with elevation of foot.No injuries to foot,redness or signs of infections or gout. - advised to elevate leg when sitting. - Notify provider's office if symptoms worsen or having any redness or pain.  2. Type 2 diabetes mellitus without complication, with long-term current use of insulin ([place]) Does not check blood sugar on a regular basis " does not like to prick self"continue on Januvia and Actos. - sitaGLIPtin (JANUVIA) 100 MG tablet; TAKE ONE TABLET BY MOUTH ONCE EVERY MORNING TO CONTROL DIABETES E11.9 Z79.4  Dispense: 90 tablet; Refill: 1  3. Essential hypertension Reviewed how to check blood pressure.Patient checked B/p using his new automatic blood pressure cuff machine.B/p within target goal for type 2 DM.currently off B/p medication.continue on ASA 81 mg tablet daily for  cardiovascular event prophylaxis.  Labs/tests ordered:None   Next appt:  09/27/2019  Time spent with patient 21 minutes >50% time spent counseling; reviewing medical record; tests; labs; and developing future plan of care

## 2019-05-28 ENCOUNTER — Other Ambulatory Visit: Payer: Self-pay | Admitting: *Deleted

## 2019-05-28 MED ORDER — PIOGLITAZONE HCL 45 MG PO TABS: ORAL_TABLET | ORAL | 1 refills | Status: DC

## 2019-05-28 NOTE — Telephone Encounter (Signed)
Patient called and requested refill.

## 2019-06-15 DIAGNOSIS — E1159 Type 2 diabetes mellitus with other circulatory complications: Secondary | ICD-10-CM | POA: Diagnosis not present

## 2019-06-15 DIAGNOSIS — L600 Ingrowing nail: Secondary | ICD-10-CM | POA: Diagnosis not present

## 2019-06-15 DIAGNOSIS — Q6689 Other  specified congenital deformities of feet: Secondary | ICD-10-CM | POA: Diagnosis not present

## 2019-06-15 DIAGNOSIS — L84 Corns and callosities: Secondary | ICD-10-CM | POA: Diagnosis not present

## 2019-08-24 DIAGNOSIS — E1159 Type 2 diabetes mellitus with other circulatory complications: Secondary | ICD-10-CM | POA: Diagnosis not present

## 2019-08-24 DIAGNOSIS — L84 Corns and callosities: Secondary | ICD-10-CM | POA: Diagnosis not present

## 2019-08-24 DIAGNOSIS — L600 Ingrowing nail: Secondary | ICD-10-CM | POA: Diagnosis not present

## 2019-09-27 ENCOUNTER — Other Ambulatory Visit: Payer: Self-pay

## 2019-09-27 ENCOUNTER — Other Ambulatory Visit: Payer: Medicare Other

## 2019-09-27 DIAGNOSIS — E782 Mixed hyperlipidemia: Secondary | ICD-10-CM

## 2019-09-27 DIAGNOSIS — E118 Type 2 diabetes mellitus with unspecified complications: Secondary | ICD-10-CM

## 2019-09-27 DIAGNOSIS — I10 Essential (primary) hypertension: Secondary | ICD-10-CM | POA: Diagnosis not present

## 2019-09-28 LAB — CBC WITH DIFFERENTIAL/PLATELET
Absolute Monocytes: 738 cells/uL (ref 200–950)
Basophils Absolute: 62 cells/uL (ref 0–200)
Basophils Relative: 1 %
Eosinophils Absolute: 143 cells/uL (ref 15–500)
Eosinophils Relative: 2.3 %
HCT: 39.3 % (ref 38.5–50.0)
Hemoglobin: 13 g/dL — ABNORMAL LOW (ref 13.2–17.1)
Lymphs Abs: 1581 cells/uL (ref 850–3900)
MCH: 33.5 pg — ABNORMAL HIGH (ref 27.0–33.0)
MCHC: 33.1 g/dL (ref 32.0–36.0)
MCV: 101.3 fL — ABNORMAL HIGH (ref 80.0–100.0)
MPV: 9.7 fL (ref 7.5–12.5)
Monocytes Relative: 11.9 %
Neutro Abs: 3677 cells/uL (ref 1500–7800)
Neutrophils Relative %: 59.3 %
Platelets: 263 10*3/uL (ref 140–400)
RBC: 3.88 10*6/uL — ABNORMAL LOW (ref 4.20–5.80)
RDW: 18.2 % — ABNORMAL HIGH (ref 11.0–15.0)
Total Lymphocyte: 25.5 %
WBC: 6.2 10*3/uL (ref 3.8–10.8)

## 2019-09-28 LAB — HEMOGLOBIN A1C
Hgb A1c MFr Bld: 6.4 % of total Hgb — ABNORMAL HIGH (ref <5.7)
Mean Plasma Glucose: 137 (calc)
eAG (mmol/L): 7.6 (calc)

## 2019-09-28 LAB — COMPLETE METABOLIC PANEL WITH GFR
AG Ratio: 1.7 (calc) (ref 1.0–2.5)
ALT: 9 U/L (ref 9–46)
AST: 17 U/L (ref 10–35)
Albumin: 4 g/dL (ref 3.6–5.1)
Alkaline phosphatase (APISO): 66 U/L (ref 35–144)
BUN: 22 mg/dL (ref 7–25)
CO2: 28 mmol/L (ref 20–32)
Calcium: 8.7 mg/dL (ref 8.6–10.3)
Chloride: 105 mmol/L (ref 98–110)
Creat: 0.8 mg/dL (ref 0.70–1.11)
GFR, Est African American: 90 mL/min/{1.73_m2} (ref >=60)
GFR, Est Non African American: 78 mL/min/{1.73_m2} (ref >=60)
Globulin: 2.4 g/dL (calc) (ref 1.9–3.7)
Glucose, Bld: 114 mg/dL — ABNORMAL HIGH (ref 65–99)
Potassium: 3.8 mmol/L (ref 3.5–5.3)
Sodium: 141 mmol/L (ref 135–146)
Total Bilirubin: 0.8 mg/dL (ref 0.2–1.2)
Total Protein: 6.4 g/dL (ref 6.1–8.1)

## 2019-09-28 LAB — LIPID PANEL
Cholesterol: 137 mg/dL (ref <200)
HDL: 57 mg/dL (ref >=40)
LDL Cholesterol (Calc): 64 mg/dL (calc)
Non-HDL Cholesterol (Calc): 80 mg/dL (calc) (ref <130)
Total CHOL/HDL Ratio: 2.4 (calc) (ref <5.0)
Triglycerides: 76 mg/dL (ref <150)

## 2019-09-28 LAB — TSH: TSH: 2.62 mIU/L (ref 0.40–4.50)

## 2019-10-03 ENCOUNTER — Encounter: Payer: Self-pay | Admitting: Internal Medicine

## 2019-10-03 ENCOUNTER — Other Ambulatory Visit: Payer: Self-pay

## 2019-10-03 ENCOUNTER — Non-Acute Institutional Stay: Payer: Medicare Other | Admitting: Internal Medicine

## 2019-10-03 VITALS — BP 136/68 | HR 62 | Temp 97.7°F | Ht 66.0 in | Wt 152.8 lb

## 2019-10-03 DIAGNOSIS — E1159 Type 2 diabetes mellitus with other circulatory complications: Secondary | ICD-10-CM | POA: Diagnosis not present

## 2019-10-03 DIAGNOSIS — E538 Deficiency of other specified B group vitamins: Secondary | ICD-10-CM

## 2019-10-03 DIAGNOSIS — I10 Essential (primary) hypertension: Secondary | ICD-10-CM | POA: Diagnosis not present

## 2019-10-03 DIAGNOSIS — M7989 Other specified soft tissue disorders: Secondary | ICD-10-CM | POA: Diagnosis not present

## 2019-10-03 MED ORDER — PIOGLITAZONE HCL 30 MG PO TABS: 30.0000 mg | ORAL_TABLET | Freq: Every day | ORAL | 0 refills | Status: DC

## 2019-10-03 NOTE — Progress Notes (Addendum)
Location:  [place] of Service:  Clinic (12)  Provider:   Code Status:  Goals of Care:  Advanced Directives 04/10/2019  Does Patient Have a Medical Advance Directive? Yes  Type of Paramedic of [place];Out of facility DNR (pink MOST or yellow form);Living will  Does patient want to make changes to medical advance directive? No - Patient declined  Copy of [place] in Chart? Yes - validated most recent copy scanned in chart (See row information)  Would patient like information on creating a medical advance directive? -  Pre-existing out of facility DNR order (yellow form or pink MOST form) Pink MOST form placed in chart (order not valid for inpatient use)     Chief Complaint  Patient presents with  . Medical Management of Chronic Issues    6 month follow up and discuss labs    HPI: Patient is a 85 y.o. male seen today for medical management of chronic diseases.   Patient has h/o Diabetes mellitus, LE edema, Right Frozen Shoulder,And B12 def  Patient seen for regular visit today.  He had lives in [place] in [facility] Is very independent.  Still drives.  Does not use cane or a walker.  Lives with his wife. Has not had any falls recently.His weight is stable Diabetes Does not do Accu-Cheks at home ?Atypical Chest Pain He was complaining that he is sometimes little discomfort on the left side of his chest.  Denied any relationship to exertion.  No associated symptoms.? Reflux  Dizziness He does complain of long history of dizziness after having heavy lunch.  He states that it usually he feels lightheaded and it lasts for few minutes and goes away Does have h/o Orthostatic Hypotension in Past Echo was normal in 4/19 Lower extremity edema Left more than right Has not been using his TED hoses  Past Medical History:  Diagnosis Date  . Abdominal pain, other specified site   . Allergic rhinitis due to pollen   .  Benign paroxysmal positional vertigo   . Disturbance of skin sensation    left great toe  . Elevated prostate specific antigen (PSA)   . Hematuria, unspecified   . Hypertrophy of prostate without urinary obstruction and other lower urinary tract symptoms (LUTS)   . Impotence of organic origin   . Left knee pain 05/28/2014  . Macrocytosis   . Other and unspecified hyperlipidemia   . Other malaise and fatigue   . Spermatocele    bilateral  . Type II or unspecified type diabetes mellitus without mention of complication, uncontrolled   . Unspecified essential hypertension   . Unspecified glaucoma(365.9)     Past Surgical History:  Procedure Laterality Date  . CATARACT EXTRACTION EXTRACAPSULAR  2008   bilateraly Dr. Katy Fitch  . COLONOSCOPY  01/03/2002   normal Dr. Earlean Shawl  . PROSTATE BIOPSY  1995   due to elevated PSA normal  . TONSILLECTOMY      Allergies  Allergen Reactions  . Metformin And Related Other (See Comments)    unknown  . Sulfa Antibiotics Swelling  . Tetanus Toxoids Other (See Comments)    unknown  . Typhoid Vaccines Other (See Comments)    unknown    Outpatient Encounter Medications as of 10/03/2019  Medication Sig  . aspirin 81 MG tablet Take 81 mg by mouth daily.  Marland Kitchen glucose blood test strip Use one strip to test blood sugar once a day before  breakfast . Dx E11.9  . meclizine (ANTIVERT) 25 MG tablet Take 1 tablet (25 mg total) by mouth 2 (two) times daily as needed for dizziness.  Marland Kitchen MICROLET LANCETS MISC Use one lancet each morning to test blood sugar. Dx. E11.9  . pioglitazone (ACTOS) 45 MG tablet Take one tablet by mouth once daily to control blood sugar  . sitaGLIPtin (JANUVIA) 100 MG tablet TAKE ONE TABLET BY MOUTH ONCE EVERY MORNING TO CONTROL DIABETES E11.9 Z79.4  . vitamin B-12 (CYANOCOBALAMIN) 1000 MCG tablet Take 1 tablet (1,000 mcg total) by mouth daily.  . [DISCONTINUED] acetaminophen (TYLENOL) 500 MG tablet Take 500 mg by mouth 2 (two) times daily as  needed.   No facility-administered encounter medications on file as of 10/03/2019.     Review of Systems:  Review of Systems  All other Systems were negative except mentioned in Presenting Complain  Health Maintenance  Topic Date Due  . TETANUS/TDAP  10/07/1943  . FOOT EXAM  11/09/2018  . OPHTHALMOLOGY EXAM  05/17/2019  . URINE MICROALBUMIN  05/26/2019  . INFLUENZA VACCINE  07/21/2019  . HEMOGLOBIN A1C  03/27/2020  . PNA vac Low Risk Adult  Completed    Physical Exam: Vitals:   10/03/19 1302  BP: 136/68  Pulse: 62  Temp: 97.7 F (36.5 C)  SpO2: 98%  Weight: 152 lb 12.8 oz (69.3 kg)  Height: 5\' 6"  (1.676 m)   Body mass index is 24.66 kg/m. Physical Exam  Constitutional: Oriented to person, place, and time. Well-developed and well-nourished.  HENT:  Head: Normocephalic.  Mouth/Throat: Oropharynx is clear and moist.  Eyes: Pupils are equal, round, and reactive to light.  Neck: Neck supple.  Cardiovascular: Normal rate and normal heart sounds.  No murmur heard. Pulmonary/Chest: Effort normal and breath sounds normal. No respiratory distress. No wheezes. She has no rales.  Abdominal: Soft. Bowel sounds are normal. No distension. There is no tenderness. There is no rebound.  Musculoskeletal: Bilateral Mild edema  Has frozen Right Shoulder Lymphadenopathy: none Neurological: Alert and oriented to person, place, and time. Steady Gait Skin: Skin is warm and dry.  Psychiatric: Normal mood and affect. Behavior is normal. Thought content normal.    Labs reviewed: Basic Metabolic Panel: Recent Labs    03/16/19 0000 09/27/19 0000  NA 142 141  K 3.7 3.8  CL 107 105  CO2 28 28  GLUCOSE 190* 114*  BUN 20 22  CREATININE 0.87 0.80  CALCIUM 8.5* 8.7  TSH  --  2.62   Liver Function Tests: Recent Labs    03/16/19 0000 09/27/19 0000  AST 13 17  ALT 7* 9  BILITOT 0.7 0.8  PROT 6.1 6.4   No results for input(s): LIPASE, AMYLASE in the last 8760 hours. No results  for input(s): AMMONIA in the last 8760 hours. CBC: Recent Labs    03/16/19 0000 09/27/19 0000  WBC 5.5 6.2  NEUTROABS 3,212 3,677  HGB 12.5* 13.0*  HCT 36.2* 39.3  MCV 99.5 101.3*  PLT 259 263   Lipid Panel: Recent Labs    03/16/19 0000 09/27/19 0000  CHOL 127 137  HDL 52 57  LDLCALC 59 64  TRIG 84 76  CHOLHDL 2.4 2.4   Lab Results  Component Value Date   HGBA1C 6.4 (H) 09/27/2019    Procedures since last visit: No results found.  Assessment/Plan Type 2 diabetes mellitus  A1C was 6.4 D/w with him about lowering Actos to 30 mg Follow up in 4 months  Dizziness  He Will check his BP at that time at home Does have h/o Orthostatic hypotension Echo was normal  B12 deficiency Continue Supplement Level Normal in 3/20  Leg swelling  Ted hoses Elevate when at home  Atypical Chest Pain Pepcid 20 mg Prn Will let me know if symptoms not better and Associated symptoms  Vaccination TDAP Wants to wait for Next visit Also need Shigrix   Labs/tests ordered:  * No order type specified * Next appt:  Visit date not found  Total time spent in this patient care encounter was  25_  minutes; greater than 50% of the visit spent counseling patient and staff, reviewing records , Labs and coordinating care for problems addressed at this encounter.

## 2019-10-28 ENCOUNTER — Other Ambulatory Visit: Payer: Self-pay | Admitting: Internal Medicine

## 2019-12-08 ENCOUNTER — Other Ambulatory Visit: Payer: Self-pay | Admitting: Family

## 2019-12-08 DIAGNOSIS — Z794 Long term (current) use of insulin: Secondary | ICD-10-CM

## 2019-12-08 DIAGNOSIS — E119 Type 2 diabetes mellitus without complications: Secondary | ICD-10-CM

## 2019-12-10 NOTE — Telephone Encounter (Signed)
Pending medication refill due to high allergy/ contraindication warning. Routing to provider for approval patient request 90 day supply

## 2020-01-20 ENCOUNTER — Other Ambulatory Visit: Payer: Self-pay | Admitting: Internal Medicine

## 2020-01-31 ENCOUNTER — Other Ambulatory Visit: Payer: Self-pay

## 2020-01-31 DIAGNOSIS — E1159 Type 2 diabetes mellitus with other circulatory complications: Secondary | ICD-10-CM

## 2020-01-31 DIAGNOSIS — E538 Deficiency of other specified B group vitamins: Secondary | ICD-10-CM

## 2020-02-01 LAB — HEMOGLOBIN A1C
Hgb A1c MFr Bld: 6.9 % of total Hgb — ABNORMAL HIGH (ref <5.7)
Mean Plasma Glucose: 151 (calc)
eAG (mmol/L): 8.4 (calc)

## 2020-02-01 LAB — CBC WITH DIFFERENTIAL/PLATELET
Absolute Monocytes: 697 cells/uL (ref 200–950)
Basophils Absolute: 60 cells/uL (ref 0–200)
Basophils Relative: 0.9 %
Eosinophils Absolute: 121 cells/uL (ref 15–500)
Eosinophils Relative: 1.8 %
HCT: 39.4 % (ref 38.5–50.0)
Hemoglobin: 13.2 g/dL (ref 13.2–17.1)
Lymphs Abs: 1936 cells/uL (ref 850–3900)
MCH: 33.8 pg — ABNORMAL HIGH (ref 27.0–33.0)
MCHC: 33.5 g/dL (ref 32.0–36.0)
MCV: 101 fL — ABNORMAL HIGH (ref 80.0–100.0)
MPV: 9.8 fL (ref 7.5–12.5)
Monocytes Relative: 10.4 %
Neutro Abs: 3886 cells/uL (ref 1500–7800)
Neutrophils Relative %: 58 %
Platelets: 313 10*3/uL (ref 140–400)
RBC: 3.9 10*6/uL — ABNORMAL LOW (ref 4.20–5.80)
RDW: 17.8 % — ABNORMAL HIGH (ref 11.0–15.0)
Total Lymphocyte: 28.9 %
WBC: 6.7 10*3/uL (ref 3.8–10.8)

## 2020-02-01 LAB — VITAMIN B12: Vitamin B-12: 1336 pg/mL — ABNORMAL HIGH (ref 200–1100)

## 2020-02-06 ENCOUNTER — Encounter: Payer: Self-pay | Admitting: Internal Medicine

## 2020-02-06 ENCOUNTER — Non-Acute Institutional Stay: Payer: Medicare Other | Admitting: Internal Medicine

## 2020-02-06 ENCOUNTER — Other Ambulatory Visit: Payer: Self-pay

## 2020-02-06 VITALS — BP 122/66 | HR 74 | Temp 98.2°F | Ht 66.0 in | Wt 155.8 lb

## 2020-02-06 DIAGNOSIS — I10 Essential (primary) hypertension: Secondary | ICD-10-CM

## 2020-02-06 DIAGNOSIS — E1159 Type 2 diabetes mellitus with other circulatory complications: Secondary | ICD-10-CM

## 2020-02-06 DIAGNOSIS — M25472 Effusion, left ankle: Secondary | ICD-10-CM | POA: Diagnosis not present

## 2020-02-06 DIAGNOSIS — E538 Deficiency of other specified B group vitamins: Secondary | ICD-10-CM

## 2020-02-06 NOTE — Progress Notes (Signed)
Location:  [place] of Service:  Clinic (12)  Provider:   Code Status:  Goals of Care:  Advanced Directives 04/10/2019  Does Patient Have a Medical Advance Directive? Yes  Type of Paramedic of [place];Out of facility DNR (pink MOST or yellow form);Living will  Does patient want to make changes to medical advance directive? No - Patient declined  Copy of [place] in Chart? Yes - validated most recent copy scanned in chart (See row information)  Would patient like information on creating a medical advance directive? -  Pre-existing out of facility DNR order (yellow form or pink MOST form) Pink MOST form placed in chart (order not valid for inpatient use)     Chief Complaint  Patient presents with  . Medical Management of Chronic Issues    4 month follow up  . Health Maintenance    Foot and eye exam, urine microalbumin    HPI: Patient is a 86 y.o. male seen today for medical management of chronic diseases.    Patient has h/o Diabetes mellitus, LE edema, Right Frozen Shoulder,And B12 def  Patient seen for regular visit today.  He had lives in [place] in [facility] Is very independent.  Still drives.  Does not use cane or a walker.  Lives with his wife. Has not had any falls recently.His weight is stable His POA is their Daughter who lives in [place] their son 7 years ago to cancer  Diabetes Doing well with lower dose of Actos Dizziness C/o Occasional;l Dizziness after eating Usually goes away. No Falls. Has documented orthostatic Hypotension in past Echo was normal in 4/19 LE edema Left more then right Also c/o Some Burning / Tingling in Left Feet No Numbness Does not like to use Ted hoses    Past Medical History:  Diagnosis Date  . Abdominal pain, other specified site   . Allergic rhinitis due to pollen   . Benign paroxysmal positional vertigo   . Disturbance of skin sensation    left great  toe  . Elevated prostate specific antigen (PSA)   . Hematuria, unspecified   . Hypertrophy of prostate without urinary obstruction and other lower urinary tract symptoms (LUTS)   . Impotence of organic origin   . Left knee pain 05/28/2014  . Macrocytosis   . Other and unspecified hyperlipidemia   . Other malaise and fatigue   . Spermatocele    bilateral  . Type II or unspecified type diabetes mellitus without mention of complication, uncontrolled   . Unspecified essential hypertension   . Unspecified glaucoma(365.9)     Past Surgical History:  Procedure Laterality Date  . CATARACT EXTRACTION EXTRACAPSULAR  2008   bilateraly Dr. Katy Fitch  . COLONOSCOPY  01/03/2002   normal Dr. Earlean Shawl  . PROSTATE BIOPSY  1995   due to elevated PSA normal  . TONSILLECTOMY      Allergies  Allergen Reactions  . Metformin And Related Other (See Comments)    unknown  . Sulfa Antibiotics Swelling  . Tetanus Toxoids Other (See Comments)    unknown  . Typhoid Vaccines Other (See Comments)    unknown    Outpatient Encounter Medications as of 02/06/2020  Medication Sig  . aspirin 81 MG tablet Take 81 mg by mouth daily.  Marland Kitchen glucose blood test strip Use one strip to test blood sugar once a day before breakfast . Dx E11.9  . meclizine (ANTIVERT) 25 MG  tablet Take 1 tablet (25 mg total) by mouth 2 (two) times daily as needed for dizziness.  Marland Kitchen MICROLET LANCETS MISC Use one lancet each morning to test blood sugar. Dx. E11.9  . pioglitazone (ACTOS) 30 MG tablet TAKE 1 TABLET BY MOUTH EVERY DAY  . sitaGLIPtin (JANUVIA) 100 MG tablet TAKE ONE TABLET BY MOUTH ONCE EVERY MORNING TO CONTROL DIABETES  . vitamin B-12 (CYANOCOBALAMIN) 1000 MCG tablet Take 1 tablet (1,000 mcg total) by mouth daily.   No facility-administered encounter medications on file as of 02/06/2020.    Review of Systems:  Review of Systems  Review of Systems  Constitutional: Negative for activity change, appetite change, chills, diaphoresis,  fatigue and fever.  HENT: Negative for mouth sores, postnasal drip, rhinorrhea, sinus pain and sore throat.   Respiratory: Negative for apnea, cough, chest tightness, shortness of breath and wheezing.   Cardiovascular: Negative for chest pain, palpitations and leg swelling.  Gastrointestinal: Negative for abdominal distention, abdominal pain, constipation, diarrhea, nausea and vomiting.  Genitourinary: Negative for dysuria and frequency.  Musculoskeletal: Negative for arthralgias, joint swelling and myalgias.  Skin: Negative for rash.  Neurological: Negative for  syncope, weakness, and numbness.  Psychiatric/Behavioral: Negative for behavioral problems, confusion and sleep disturbance.     Health Maintenance  Topic Date Due  . FOOT EXAM  11/09/2018  . OPHTHALMOLOGY EXAM  05/17/2019  . URINE MICROALBUMIN  05/26/2019  . TETANUS/TDAP  02/05/2021 (Originally 10/07/1943)  . HEMOGLOBIN A1C  07/30/2020  . INFLUENZA VACCINE  Completed  . PNA vac Low Risk Adult  Completed    Physical Exam: Vitals:   02/06/20 1425  BP: 122/66  Pulse: 74  Temp: 98.2 F (36.8 C)  SpO2: 97%  Weight: 155 lb 12.8 oz (70.7 kg)  Height: 5\' 6"  (1.676 m)   Body mass index is 25.15 kg/m. Physical Exam  Constitutional: Oriented to person, place, and time. Well-developed and well-nourished.  HENT:  Head: Normocephalic.  Mouth/Throat: Oropharynx is clear and moist.  Eyes: Pupils are equal, round, and reactive to light.  Neck: Neck supple.  Cardiovascular: Normal rate and normal heart sounds.  No murmur heard. Pulmonary/Chest: Effort normal and breath sounds normal. No respiratory distress. No wheezes. She has no rales.  Abdominal: Soft. Bowel sounds are normal. No distension. There is no tenderness. There is no rebound.  Musculoskeletal: Bilateral Edema mild.  Right Shoulder Cannot raise above her Shoulder.  Lymphadenopathy: none Neurological: Alert and oriented to person, place, and time.  No Focal  Deficits Stable Gait Skin: Skin is warm and dry.  Psychiatric: Normal mood and affect. Behavior is normal. Thought content normal.     Labs reviewed: Basic Metabolic Panel: Recent Labs    03/16/19 0000 09/27/19 0000  NA 142 141  K 3.7 3.8  CL 107 105  CO2 28 28  GLUCOSE 190* 114*  BUN 20 22  CREATININE 0.87 0.80  CALCIUM 8.5* 8.7  TSH  --  2.62   Liver Function Tests: Recent Labs    03/16/19 0000 09/27/19 0000  AST 13 17  ALT 7* 9  BILITOT 0.7 0.8  PROT 6.1 6.4   No results for input(s): LIPASE, AMYLASE in the last 8760 hours. No results for input(s): AMMONIA in the last 8760 hours. CBC: Recent Labs    03/16/19 0000 09/27/19 0000 01/31/20 0735  WBC 5.5 6.2 6.7  NEUTROABS 3,212 3,677 3,886  HGB 12.5* 13.0* 13.2  HCT 36.2* 39.3 39.4  MCV 99.5 101.3* 101.0*  PLT 259 263  313   Lipid Panel: Recent Labs    03/16/19 0000 09/27/19 0000  CHOL 127 137  HDL 52 57  LDLCALC 59 64  TRIG 84 76  CHOLHDL 2.4 2.4   Lab Results  Component Value Date   HGBA1C 6.9 (H) 01/31/2020    Procedures since last visit: No results found.  Assessment/Plan Type 2 diabetes mellitus  Repeat A1C is 6.9 Continue Actos and Januvia Left Burning Pain ? Diabetic Neuropathy Does not want treatment right now  Dizziness Does have h/o Orthostatic Hypotension Doing well . No falls  B12 deficiency Continue Supplement Level High in 2/21  Leg swelling  Keep elevate when sitting Frozen Right Shoulder Does not want therapy or see Ortho  Vaccination TDAP Does not want it due to side effects Also says got Shingrix 7 years ago Will check for me before next Appointment Also Need to follow with His Ophthalmologist and Dentist Labs/tests ordered:  * No order type specified * Next appt:  Visit date not found Total time spent in this patient care encounter was  45_  minutes; greater than 50% of the visit spent counseling patient and staff, reviewing records , Labs and  coordinating care for problems addressed at this encounter.

## 2020-03-05 ENCOUNTER — Other Ambulatory Visit: Payer: Self-pay

## 2020-03-05 ENCOUNTER — Non-Acute Institutional Stay: Payer: Medicare Other | Admitting: Internal Medicine

## 2020-03-05 ENCOUNTER — Encounter: Payer: Self-pay | Admitting: Internal Medicine

## 2020-03-05 VITALS — BP 136/80 | HR 73 | Temp 97.7°F | Ht 66.0 in | Wt 153.0 lb

## 2020-03-05 DIAGNOSIS — R040 Epistaxis: Secondary | ICD-10-CM | POA: Diagnosis not present

## 2020-03-06 NOTE — Progress Notes (Signed)
Location: [place] of Service:  Clinic (12)  Provider:   Code Status:  Goals of Care:  Advanced Directives 04/10/2019  Does Patient Have a Medical Advance Directive? Yes  Type of Paramedic of [place];Out of facility DNR (pink MOST or yellow form);Living will  Does patient want to make changes to medical advance directive? No - Patient declined  Copy of [place] in Chart? Yes - validated most recent copy scanned in chart (See row information)  Would patient like information on creating a medical advance directive? -  Pre-existing out of facility DNR order (yellow form or pink MOST form) Pink MOST form placed in chart (order not valid for inpatient use)     Chief Complaint  Patient presents with  . Acute Visit    Episode of nose bleed and blood with cough.    HPI: Patient is a 86 y.o. male seen today for an acute visit for Nose Bleed  Patient has h/o Diabetes mellitus,LE edema, Right Frozen Shoulder,And B12 def  He had an episode of nosebleed couple of days ago.  He was seen by the facility nurse and told to follow-up with me.  Patient said that he was sneezing then the blood came.  He got scared because there was a lot of blood.  He also coughed up some blood.  But then it stopped and has not had any episodes since then. He denied any runny nose.  Denies using over-the-counter Benadryl or Claritin. No other acute issues. Past Medical History:  Diagnosis Date  . Abdominal pain, other specified site   . Allergic rhinitis due to pollen   . Benign paroxysmal positional vertigo   . Disturbance of skin sensation    left great toe  . Elevated prostate specific antigen (PSA)   . Hematuria, unspecified   . Hypertrophy of prostate without urinary obstruction and other lower urinary tract symptoms (LUTS)   . Impotence of organic origin   . Left knee pain 05/28/2014  . Macrocytosis   . Other and unspecified hyperlipidemia    . Other malaise and fatigue   . Spermatocele    bilateral  . Type II or unspecified type diabetes mellitus without mention of complication, uncontrolled   . Unspecified essential hypertension   . Unspecified glaucoma(365.9)     Past Surgical History:  Procedure Laterality Date  . CATARACT EXTRACTION EXTRACAPSULAR  2008   bilateraly Dr. Katy Fitch  . COLONOSCOPY  01/03/2002   normal Dr. Earlean Shawl  . PROSTATE BIOPSY  1995   due to elevated PSA normal  . TONSILLECTOMY      Allergies  Allergen Reactions  . Metformin And Related Other (See Comments)    unknown  . Sulfa Antibiotics Swelling  . Tetanus Toxoids Other (See Comments)    unknown  . Typhoid Vaccines Other (See Comments)    unknown    Outpatient Encounter Medications as of 03/05/2020  Medication Sig  . aspirin 81 MG tablet Take 81 mg by mouth daily.  Marland Kitchen glucose blood test strip Use one strip to test blood sugar once a day before breakfast . Dx E11.9  . MICROLET LANCETS MISC Use one lancet each morning to test blood sugar. Dx. E11.9  . pioglitazone (ACTOS) 30 MG tablet TAKE 1 TABLET BY MOUTH EVERY DAY  . sitaGLIPtin (JANUVIA) 100 MG tablet TAKE ONE TABLET BY MOUTH ONCE EVERY MORNING TO CONTROL DIABETES  . vitamin B-12 (CYANOCOBALAMIN) 1000 MCG tablet Take 1  tablet (1,000 mcg total) by mouth daily.   No facility-administered encounter medications on file as of 03/05/2020.    Review of Systems:  Review of Systems  Constitutional: Negative.   HENT: Positive for nosebleeds. Negative for congestion, postnasal drip, rhinorrhea and sinus pain.   Respiratory: Negative.   Cardiovascular: Positive for leg swelling.  Gastrointestinal: Negative.   Musculoskeletal: Negative.   Neurological: Positive for dizziness.  Psychiatric/Behavioral: Negative.     Health Maintenance  Topic Date Due  . FOOT EXAM  11/09/2018  . OPHTHALMOLOGY EXAM  05/17/2019  . URINE MICROALBUMIN  05/26/2019  . TETANUS/TDAP  02/05/2021 (Originally  10/07/1943)  . HEMOGLOBIN A1C  07/30/2020  . INFLUENZA VACCINE  Completed  . PNA vac Low Risk Adult  Completed    Physical Exam: Vitals:   03/05/20 1550  BP: 136/80  Pulse: 73  Temp: 97.7 F (36.5 C)  SpO2: 94%  Weight: 153 lb (69.4 kg)  Height: 5\' 6"  (1.676 m)   Body mass index is 24.69 kg/m. Physical Exam  Constitutional: Oriented to person, place, and time. Well-developed and well-nourished.  HENT:  Head: Normocephalic.  Mouth/Throat: Oropharynx is clear and moist.  Nose There was no sinus tenderness no active lesion in his nose.  It was dry and there was no other signs of bleeding Eyes: Pupils are equal, round, and reactive to light.  Neck: Neck supple.  Cardiovascular: Normal rate and normal heart sounds.  No murmur heard. Pulmonary/Chest: Effort normal and breath sounds normal. No respiratory distress. No wheezes. She has no rales.  Abdominal: Soft. Bowel sounds are normal. No distension. There is no tenderness. There is no rebound.  Musculoskeletal: Mild edema bilateral Lymphadenopathy: none Neurological: Alert and oriented to person, place, and time.  Skin: Skin is warm and dry.  Psychiatric: Normal mood and affect. Behavior is normal. Thought content normal.    Labs reviewed: Basic Metabolic Panel: Recent Labs    03/16/19 0000 09/27/19 0000  NA 142 141  K 3.7 3.8  CL 107 105  CO2 28 28  GLUCOSE 190* 114*  BUN 20 22  CREATININE 0.87 0.80  CALCIUM 8.5* 8.7  TSH  --  2.62   Liver Function Tests: Recent Labs    03/16/19 0000 09/27/19 0000  AST 13 17  ALT 7* 9  BILITOT 0.7 0.8  PROT 6.1 6.4   No results for input(s): LIPASE, AMYLASE in the last 8760 hours. No results for input(s): AMMONIA in the last 8760 hours. CBC: Recent Labs    03/16/19 0000 09/27/19 0000 01/31/20 0735  WBC 5.5 6.2 6.7  NEUTROABS 3,212 3,677 3,886  HGB 12.5* 13.0* 13.2  HCT 36.2* 39.3 39.4  MCV 99.5 101.3* 101.0*  PLT 259 263 313   Lipid Panel: Recent Labs     03/16/19 0000 09/27/19 0000  CHOL 127 137  HDL 52 57  LDLCALC 59 64  TRIG 84 76  CHOLHDL 2.4 2.4   Lab Results  Component Value Date   HGBA1C 6.9 (H) 01/31/2020    Procedures since last visit: No results found.  Assessment/Plan  Bleeding from the nose Had one episode and it  stopped The exam is benign at this time Discussed with the patient to use less heat in the house not to use over-the-counter antihistamines.  And hold aspirin if have a lot of bleeding.  Patient understood to call our office if he has any more episodes   Labs/tests ordered:  * No order type specified * Next appt:  05/22/2020  

## 2020-03-18 LAB — HM DIABETES EYE EXAM

## 2020-03-19 ENCOUNTER — Encounter: Payer: Self-pay | Admitting: *Deleted

## 2020-05-22 ENCOUNTER — Other Ambulatory Visit: Payer: Self-pay

## 2020-05-22 DIAGNOSIS — E1159 Type 2 diabetes mellitus with other circulatory complications: Secondary | ICD-10-CM

## 2020-05-23 LAB — COMPLETE METABOLIC PANEL WITH GFR
AG Ratio: 1.4 (calc) (ref 1.0–2.5)
ALT: 9 U/L (ref 9–46)
AST: 16 U/L (ref 10–35)
Albumin: 3.7 g/dL (ref 3.6–5.1)
Alkaline phosphatase (APISO): 78 U/L (ref 35–144)
BUN/Creatinine Ratio: 37 (calc) — ABNORMAL HIGH (ref 6–22)
BUN: 26 mg/dL — ABNORMAL HIGH (ref 7–25)
CO2: 28 mmol/L (ref 20–32)
Calcium: 8.8 mg/dL (ref 8.6–10.3)
Chloride: 103 mmol/L (ref 98–110)
Creat: 0.71 mg/dL (ref 0.70–1.11)
GFR, Est African American: 94 mL/min/{1.73_m2} (ref >=60)
GFR, Est Non African American: 81 mL/min/{1.73_m2} (ref >=60)
Globulin: 2.7 g/dL (calc) (ref 1.9–3.7)
Glucose, Bld: 126 mg/dL — ABNORMAL HIGH (ref 65–99)
Potassium: 4.2 mmol/L (ref 3.5–5.3)
Sodium: 141 mmol/L (ref 135–146)
Total Bilirubin: 0.8 mg/dL (ref 0.2–1.2)
Total Protein: 6.4 g/dL (ref 6.1–8.1)

## 2020-05-23 LAB — CBC WITH DIFFERENTIAL/PLATELET
Absolute Monocytes: 638 cells/uL (ref 200–950)
Basophils Absolute: 70 cells/uL (ref 0–200)
Basophils Relative: 1.2 %
Eosinophils Absolute: 99 cells/uL (ref 15–500)
Eosinophils Relative: 1.7 %
HCT: 37.2 % — ABNORMAL LOW (ref 38.5–50.0)
Hemoglobin: 12.3 g/dL — ABNORMAL LOW (ref 13.2–17.1)
Lymphs Abs: 1630 cells/uL (ref 850–3900)
MCH: 33.1 pg — ABNORMAL HIGH (ref 27.0–33.0)
MCHC: 33.1 g/dL (ref 32.0–36.0)
MCV: 100 fL (ref 80.0–100.0)
MPV: 9.2 fL (ref 7.5–12.5)
Monocytes Relative: 11 %
Neutro Abs: 3364 cells/uL (ref 1500–7800)
Neutrophils Relative %: 58 %
Platelets: 358 10*3/uL (ref 140–400)
RBC: 3.72 10*6/uL — ABNORMAL LOW (ref 4.20–5.80)
RDW: 17.8 % — ABNORMAL HIGH (ref 11.0–15.0)
Total Lymphocyte: 28.1 %
WBC: 5.8 10*3/uL (ref 3.8–10.8)

## 2020-05-23 LAB — LIPID PANEL
Cholesterol: 140 mg/dL (ref <200)
HDL: 62 mg/dL (ref >=40)
LDL Cholesterol (Calc): 64 mg/dL (calc)
Non-HDL Cholesterol (Calc): 78 mg/dL (calc) (ref <130)
Total CHOL/HDL Ratio: 2.3 (calc) (ref <5.0)
Triglycerides: 56 mg/dL (ref <150)

## 2020-05-23 LAB — HEMOGLOBIN A1C
Hgb A1c MFr Bld: 7.2 % of total Hgb — ABNORMAL HIGH (ref <5.7)
Mean Plasma Glucose: 160 (calc)
eAG (mmol/L): 8.9 (calc)

## 2020-05-23 LAB — TSH: TSH: 2.65 mIU/L (ref 0.40–4.50)

## 2020-05-28 ENCOUNTER — Other Ambulatory Visit: Payer: Self-pay

## 2020-05-28 ENCOUNTER — Non-Acute Institutional Stay: Payer: Medicare Other | Admitting: Internal Medicine

## 2020-05-28 ENCOUNTER — Encounter: Payer: Self-pay | Admitting: Internal Medicine

## 2020-05-28 VITALS — BP 100/46 | HR 70 | Temp 97.8°F | Ht 66.0 in | Wt 149.2 lb

## 2020-05-28 DIAGNOSIS — M7989 Other specified soft tissue disorders: Secondary | ICD-10-CM | POA: Diagnosis not present

## 2020-05-28 DIAGNOSIS — E538 Deficiency of other specified B group vitamins: Secondary | ICD-10-CM

## 2020-05-28 DIAGNOSIS — I10 Essential (primary) hypertension: Secondary | ICD-10-CM

## 2020-05-28 DIAGNOSIS — E1159 Type 2 diabetes mellitus with other circulatory complications: Secondary | ICD-10-CM | POA: Diagnosis not present

## 2020-05-28 NOTE — Progress Notes (Signed)
Location:  [place] of Service:  Clinic (12)  Provider:   Code Status:  Goals of Care:  Advanced Directives 04/10/2019  Does Patient Have a Medical Advance Directive? Yes  Type of Paramedic of [place];Out of facility DNR (pink MOST or yellow form);Living will  Does patient want to make changes to medical advance directive? No - Patient declined  Copy of [place] in Chart? Yes - validated most recent copy scanned in chart (See row information)  Would patient like information on creating a medical advance directive? -  Pre-existing out of facility DNR order (yellow form or pink MOST form) Pink MOST form placed in chart (order not valid for inpatient use)     Chief Complaint  Patient presents with  . Medical Management of Chronic Issues    Patient returns to the clinic for follow up. He will be seeing his podiatrist this coming Friday. He does seeing an ophthamologist. He has no concerns.   . Health Maintenance    Urine microalbumin    HPI: Patient is a 86 y.o. male seen today for medical management of chronic diseases.    Patient has h/o Diabetes mellitus,LE edema, Right Frozen Shoulder,And B12 def  Patient seen for his regular visit.  Lives in [place] and friends home Very independent still drives.  Does not use cane or walker.  Lives with his wife.  Has not had any falls.  His POA is the daughter who lives in [place].  Diabetes Doing well on low-dose of Actos Dizziness Occasional sometimes after eating when he stands up.  Has documented orthostatic hypotension in the past Echo was normal in 4/19 Lower extremity edema Left more than right Does not like to use TED hoses  Had no acute complaints today Past Medical History:  Diagnosis Date  . Abdominal pain, other specified site   . Allergic rhinitis due to pollen   . Benign paroxysmal positional vertigo   . Disturbance of skin sensation    left great toe  .  Elevated prostate specific antigen (PSA)   . Hematuria, unspecified   . Hypertrophy of prostate without urinary obstruction and other lower urinary tract symptoms (LUTS)   . Impotence of organic origin   . Left knee pain 05/28/2014  . Macrocytosis   . Other and unspecified hyperlipidemia   . Other malaise and fatigue   . Spermatocele    bilateral  . Type II or unspecified type diabetes mellitus without mention of complication, uncontrolled   . Unspecified essential hypertension   . Unspecified glaucoma(365.9)     Past Surgical History:  Procedure Laterality Date  . CATARACT EXTRACTION EXTRACAPSULAR  2008   bilateraly Dr. Katy Fitch  . COLONOSCOPY  01/03/2002   normal Dr. Earlean Shawl  . PROSTATE BIOPSY  1995   due to elevated PSA normal  . TONSILLECTOMY      Allergies  Allergen Reactions  . Metformin And Related Other (See Comments)    unknown  . Sulfa Antibiotics Swelling  . Tetanus Toxoids Other (See Comments)    unknown  . Typhoid Vaccines Other (See Comments)    unknown    Outpatient Encounter Medications as of 05/28/2020  Medication Sig  . aspirin 81 MG tablet Take 81 mg by mouth daily.  Marland Kitchen glucose blood test strip Use one strip to test blood sugar once a day before breakfast . Dx E11.9  . MICROLET LANCETS MISC Use one lancet each morning to test  blood sugar. Dx. E11.9  . pioglitazone (ACTOS) 30 MG tablet TAKE 1 TABLET BY MOUTH EVERY DAY  . sitaGLIPtin (JANUVIA) 100 MG tablet TAKE ONE TABLET BY MOUTH ONCE EVERY MORNING TO CONTROL DIABETES  . [DISCONTINUED] vitamin B-12 (CYANOCOBALAMIN) 1000 MCG tablet Take 1 tablet (1,000 mcg total) by mouth daily.   No facility-administered encounter medications on file as of 05/28/2020.    Review of Systems:  Review of Systems  Review of Systems  Constitutional: Negative for activity change, appetite change, chills, diaphoresis, fatigue and fever.  HENT: Negative for mouth sores, postnasal drip, rhinorrhea, sinus pain and sore throat.     Respiratory: Negative for apnea, cough, chest tightness, shortness of breath and wheezing.   Cardiovascular: Negative for chest pain, palpitations   Gastrointestinal: Negative for abdominal distention, abdominal pain, constipation, diarrhea, nausea and vomiting.  Genitourinary: Negative for dysuria and frequency.  Musculoskeletal: Negative for arthralgias, joint swelling and myalgias.  Skin: Negative for rash.  Neurological: Negative for , syncope, weakness, light-headedness and numbness.  Psychiatric/Behavioral: Negative for behavioral problems, confusion and sleep disturbance.     Health Maintenance  Topic Date Due  . URINE MICROALBUMIN  05/26/2019  . FOOT EXAM  05/30/2020 (Originally 11/09/2018)  . TETANUS/TDAP  02/05/2021 (Originally 10/07/1943)  . INFLUENZA VACCINE  07/20/2020  . HEMOGLOBIN A1C  11/21/2020  . OPHTHALMOLOGY EXAM  03/18/2021  . COVID-19 Vaccine  Completed  . PNA vac Low Risk Adult  Completed    Physical Exam: Vitals:   05/28/20 1617  BP: (!) 100/46  Pulse: 70  Temp: 97.8 F (36.6 C)  SpO2: 94%   There is no height or weight on file to calculate BMI. Physical Exam  Constitutional: Oriented to person, place, and time. Well-developed and well-nourished.  HENT:  Head: Normocephalic.  Mouth/Throat: Oropharynx is clear and moist.  Eyes: Pupils are equal, round, and reactive to light.  Neck: Neck supple.  Cardiovascular: Normal rate and normal heart sounds.  No murmur heard. Pulmonary/Chest: Effort normal and breath sounds normal. No respiratory distress. No wheezes. She has no rales.  Abdominal: Soft. Bowel sounds are normal. No distension. There is no tenderness. There is no rebound.  Musculoskeletal: Mild edema Bilateral Lymphadenopathy: none Neurological: Alert and oriented to person, place, and time.  Gait stable Skin: Skin is warm and dry.  Psychiatric: Normal mood and affect. Behavior is normal. Thought content normal.    Labs reviewed: Basic  Metabolic Panel: Recent Labs    09/27/19 0000 05/22/20 0000  NA 141 141  K 3.8 4.2  CL 105 103  CO2 28 28  GLUCOSE 114* 126*  BUN 22 26*  CREATININE 0.80 0.71  CALCIUM 8.7 8.8  TSH 2.62 2.65   Liver Function Tests: Recent Labs    09/27/19 0000 05/22/20 0000  AST 17 16  ALT 9 9  BILITOT 0.8 0.8  PROT 6.4 6.4   No results for input(s): LIPASE, AMYLASE in the last 8760 hours. No results for input(s): AMMONIA in the last 8760 hours. CBC: Recent Labs    09/27/19 0000 01/31/20 0735 05/22/20 0000  WBC 6.2 6.7 5.8  NEUTROABS 3,677 3,886 3,364  HGB 13.0* 13.2 12.3*  HCT 39.3 39.4 37.2*  MCV 101.3* 101.0* 100.0  PLT 263 313 358   Lipid Panel: Recent Labs    09/27/19 0000 05/22/20 0000  CHOL 137 140  HDL 57 62  LDLCALC 64 64  TRIG 76 56  CHOLHDL 2.4 2.3   Lab Results  Component Value Date  HGBA1C 7.2 (H) 05/22/2020    Procedures since last visit: No results found.  Assessment/Plan  Type 2 diabetes mellitus \ Continue Actos and Januvia Continue diet and exercise Follows with ophthalmologist and podiatrist  B12 deficiency On supplement Leg swelling Elevate when sitting does not like to use TED hoses  Frozen Right Shoulder Does not want therapy or see Ortho Vaccination TDAP Does not want it due to side effects Also says got Shingrix 7 years ago  Labs/tests ordered:  * No order type specified * Next appt:  Visit date not found

## 2020-06-05 ENCOUNTER — Encounter: Payer: Self-pay | Admitting: Gastroenterology

## 2020-06-12 ENCOUNTER — Other Ambulatory Visit: Payer: Self-pay | Admitting: Internal Medicine

## 2020-06-12 DIAGNOSIS — E119 Type 2 diabetes mellitus without complications: Secondary | ICD-10-CM

## 2020-07-08 ENCOUNTER — Other Ambulatory Visit: Payer: Self-pay

## 2020-07-08 ENCOUNTER — Encounter: Payer: Self-pay | Admitting: Nurse Practitioner

## 2020-07-08 ENCOUNTER — Non-Acute Institutional Stay: Payer: Medicare Other | Admitting: Nurse Practitioner

## 2020-07-08 DIAGNOSIS — E1140 Type 2 diabetes mellitus with diabetic neuropathy, unspecified: Secondary | ICD-10-CM | POA: Diagnosis not present

## 2020-07-08 DIAGNOSIS — R202 Paresthesia of skin: Secondary | ICD-10-CM | POA: Diagnosis not present

## 2020-07-08 NOTE — Assessment & Plan Note (Signed)
T2DM takes Pilglitazone 30mg  qd, Sitagliptin 100mg  qd. Hgb a1 c7.2 05/22/20.

## 2020-07-08 NOTE — Assessment & Plan Note (Signed)
a long standing tingling, numbness of the left arm, annoying, he attributed his issue to sleep on his left side. He denied chest pain, pressure, palpitation, or SOB. Declined X-ray of the cervical spine or therapy.

## 2020-07-08 NOTE — Progress Notes (Signed)
Location:   clinic [place]   Place of Service:  Clinic (12) Provider: Marlana Latus NP  Code Status: DNR Goals of Care:  Advanced Directives 04/10/2019  Does Patient Have a Medical Advance Directive? Yes  Type of Paramedic of [place];Out of facility DNR (pink MOST or yellow form);Living will  Does patient want to make changes to medical advance directive? No - Patient declined  Copy of [place] in Chart? Yes - validated most recent copy scanned in chart (See row information)  Would patient like information on creating a medical advance directive? -  Pre-existing out of facility DNR order (yellow form or pink MOST form) Pink MOST form placed in chart (order not valid for inpatient use)     Chief Complaint  Patient presents with  . Acute Visit    tingling, numbness in left arm    HPI: Patient is a 86 y.o. male seen today for a long standing tingling, numbness of the left arm, annoying, he attributed his issue to sleep on his left side. He denied chest pain, pressure, palpitation, or SOB. Declined X-ray of the cervical spine or therapy.    T2DM takes Pilglitazone 30mg  qd, Sitagliptin 100mg  qd. Hgb a1 c7.2 05/22/20.    Past Medical History:  Diagnosis Date  . Abdominal pain, other specified site   . Allergic rhinitis due to pollen   . Benign paroxysmal positional vertigo   . Disturbance of skin sensation    left great toe  . Elevated prostate specific antigen (PSA)   . Hematuria, unspecified   . Hypertrophy of prostate without urinary obstruction and other lower urinary tract symptoms (LUTS)   . Impotence of organic origin   . Left knee pain 05/28/2014  . Macrocytosis   . Other and unspecified hyperlipidemia   . Other malaise and fatigue   . Spermatocele    bilateral  . Type II or unspecified type diabetes mellitus without mention of complication, uncontrolled   . Unspecified essential hypertension   . Unspecified glaucoma(365.9)      Past Surgical History:  Procedure Laterality Date  . CATARACT EXTRACTION EXTRACAPSULAR  2008   bilateraly Dr. Katy Fitch  . COLONOSCOPY  01/03/2002   normal Dr. Earlean Shawl  . PROSTATE BIOPSY  1995   due to elevated PSA normal  . TONSILLECTOMY      Allergies  Allergen Reactions  . Metformin And Related Other (See Comments)    unknown  . Sulfa Antibiotics Swelling  . Tetanus Toxoids Other (See Comments)    unknown  . Typhoid Vaccines Other (See Comments)    unknown    Allergies as of 07/08/2020      Reactions   Metformin And Related Other (See Comments)   unknown   Sulfa Antibiotics Swelling   Tetanus Toxoids Other (See Comments)   unknown   Typhoid Vaccines Other (See Comments)   unknown      Medication List       Accurate as of July 08, 2020  4:19 PM. If you have any questions, ask your nurse or doctor.        aspirin 81 MG tablet Take 81 mg by mouth daily.   glucose blood test strip Use one strip to test blood sugar once a day before breakfast . Dx E11.9   Microlet Lancets Misc Use one lancet each morning to test blood sugar. Dx. E11.9   pioglitazone 30 MG tablet Commonly known as: ACTOS TAKE 1 TABLET BY MOUTH EVERY  DAY   sitaGLIPtin 100 MG tablet Commonly known as: Januvia TAKE ONE TABLET BY MOUTH ONCE EVERY MORNING TO CONTROL DIABETES       Review of Systems:  Review of Systems  Constitutional: Negative for activity change, appetite change and fever.  HENT: Negative for congestion and voice change.   Eyes: Negative for visual disturbance.  Respiratory: Negative for cough.   Cardiovascular: Negative for chest pain, palpitations and leg swelling.  Gastrointestinal: Negative for abdominal pain, constipation, nausea and vomiting.  Genitourinary: Positive for frequency. Negative for difficulty urinating, dysuria and urgency.  Musculoskeletal: Positive for gait problem.  Skin: Negative for color change and pallor.  Neurological: Positive for numbness.  Negative for dizziness, speech difficulty and headaches.       Tingling, numbness left arm  Psychiatric/Behavioral: Negative for behavioral problems, confusion, hallucinations and sleep disturbance. The patient is not nervous/anxious.     Health Maintenance  Topic Date Due  . FOOT EXAM  11/09/2018  . URINE MICROALBUMIN  05/26/2019  . TETANUS/TDAP  02/05/2021 (Originally 10/07/1943)  . INFLUENZA VACCINE  07/20/2020  . HEMOGLOBIN A1C  11/21/2020  . OPHTHALMOLOGY EXAM  03/18/2021  . COVID-19 Vaccine  Completed  . PNA vac Low Risk Adult  Completed    Physical Exam: Vitals:   07/08/20 1500  BP: 120/60  Pulse: 71  Temp: (!) 97.4 F (36.3 C)  SpO2: 94%  Weight: 149 lb 3.2 oz (67.7 kg)  Height: 5\' 6"  (1.676 m)   Body mass index is 24.08 kg/m. Physical Exam Vitals reviewed.  Constitutional:      Appearance: Normal appearance.  HENT:     Head: Normocephalic and atraumatic.     Nose: Nose normal.     Mouth/Throat:     Mouth: Mucous membranes are moist.  Eyes:     Extraocular Movements: Extraocular movements intact.     Conjunctiva/sclera: Conjunctivae normal.     Pupils: Pupils are equal, round, and reactive to light.  Cardiovascular:     Rate and Rhythm: Normal rate and regular rhythm.     Heart sounds: No murmur heard.   Pulmonary:     Effort: Pulmonary effort is normal.     Breath sounds: Normal breath sounds.  Abdominal:     General: Bowel sounds are normal.     Palpations: Abdomen is soft.     Tenderness: There is no abdominal tenderness. There is no guarding or rebound.  Musculoskeletal:        General: No swelling, tenderness or deformity.     Cervical back: Normal range of motion and neck supple. No rigidity or tenderness.     Right lower leg: No edema.     Left lower leg: No edema.  Skin:    General: Skin is warm and dry.  Neurological:     General: No focal deficit present.     Mental Status: He is alert and oriented to person, place, and time. Mental  status is at baseline.     Motor: No weakness.     Coordination: Coordination normal.     Gait: Gait normal.  Psychiatric:        Mood and Affect: Mood normal.        Behavior: Behavior normal.        Thought Content: Thought content normal.        Judgment: Judgment normal.     Labs reviewed: Basic Metabolic Panel: Recent Labs    09/27/19 0000 05/22/20 0000  NA 141 141  K 3.8 4.2  CL 105 103  CO2 28 28  GLUCOSE 114* 126*  BUN 22 26*  CREATININE 0.80 0.71  CALCIUM 8.7 8.8  TSH 2.62 2.65   Liver Function Tests: Recent Labs    09/27/19 0000 05/22/20 0000  AST 17 16  ALT 9 9  BILITOT 0.8 0.8  PROT 6.4 6.4   No results for input(s): LIPASE, AMYLASE in the last 8760 hours. No results for input(s): AMMONIA in the last 8760 hours. CBC: Recent Labs    09/27/19 0000 01/31/20 0735 05/22/20 0000  WBC 6.2 6.7 5.8  NEUTROABS 3,677 3,886 3,364  HGB 13.0* 13.2 12.3*  HCT 39.3 39.4 37.2*  MCV 101.3* 101.0* 100.0  PLT 263 313 358   Lipid Panel: Recent Labs    09/27/19 0000 05/22/20 0000  CHOL 137 140  HDL 57 62  LDLCALC 64 64  TRIG 76 56  CHOLHDL 2.4 2.3   Lab Results  Component Value Date   HGBA1C 7.2 (H) 05/22/2020    Procedures since last visit: No results found.  Assessment/Plan  Paresthesia a long standing tingling, numbness of the left arm, annoying, he attributed his issue to sleep on his left side. He denied chest pain, pressure, palpitation, or SOB. Declined X-ray of the cervical spine or therapy.     DM type 2 (diabetes mellitus, type 2) T2DM takes Pilglitazone 30mg  qd, Sitagliptin 100mg  qd. Hgb a1 c7.2 05/22/20.     Labs/tests ordered:  None  Next appt:  Dr Lyndel Safe 10/01/20

## 2020-08-07 ENCOUNTER — Other Ambulatory Visit: Payer: Self-pay | Admitting: Internal Medicine

## 2020-09-25 ENCOUNTER — Other Ambulatory Visit: Payer: Self-pay

## 2020-09-25 DIAGNOSIS — E1159 Type 2 diabetes mellitus with other circulatory complications: Secondary | ICD-10-CM

## 2020-09-26 LAB — COMPLETE METABOLIC PANEL WITH GFR
AG Ratio: 1.5 (calc) (ref 1.0–2.5)
ALT: 7 U/L — ABNORMAL LOW (ref 9–46)
AST: 15 U/L (ref 10–35)
Albumin: 3.8 g/dL (ref 3.6–5.1)
Alkaline phosphatase (APISO): 82 U/L (ref 35–144)
BUN: 20 mg/dL (ref 7–25)
CO2: 25 mmol/L (ref 20–32)
Calcium: 8.6 mg/dL (ref 8.6–10.3)
Chloride: 105 mmol/L (ref 98–110)
Creat: 0.78 mg/dL (ref 0.70–1.11)
GFR, Est African American: 90 mL/min/{1.73_m2} (ref >=60)
GFR, Est Non African American: 78 mL/min/{1.73_m2} (ref >=60)
Globulin: 2.5 g/dL (calc) (ref 1.9–3.7)
Glucose, Bld: 134 mg/dL — ABNORMAL HIGH (ref 65–99)
Potassium: 4.1 mmol/L (ref 3.5–5.3)
Sodium: 141 mmol/L (ref 135–146)
Total Bilirubin: 0.9 mg/dL (ref 0.2–1.2)
Total Protein: 6.3 g/dL (ref 6.1–8.1)

## 2020-09-26 LAB — HEMOGLOBIN A1C
Hgb A1c MFr Bld: 7.5 % of total Hgb — ABNORMAL HIGH (ref <5.7)
Mean Plasma Glucose: 169 (calc)
eAG (mmol/L): 9.3 (calc)

## 2020-10-01 ENCOUNTER — Encounter: Payer: Self-pay | Admitting: Internal Medicine

## 2020-10-01 ENCOUNTER — Other Ambulatory Visit: Payer: Self-pay

## 2020-10-01 ENCOUNTER — Non-Acute Institutional Stay: Payer: Medicare Other | Admitting: Internal Medicine

## 2020-10-01 VITALS — BP 110/62 | HR 93 | Temp 96.0°F | Ht 66.0 in | Wt 149.2 lb

## 2020-10-01 DIAGNOSIS — M7989 Other specified soft tissue disorders: Secondary | ICD-10-CM | POA: Diagnosis not present

## 2020-10-01 DIAGNOSIS — E1140 Type 2 diabetes mellitus with diabetic neuropathy, unspecified: Secondary | ICD-10-CM

## 2020-10-01 DIAGNOSIS — E538 Deficiency of other specified B group vitamins: Secondary | ICD-10-CM

## 2020-10-01 NOTE — Progress Notes (Signed)
Location:  [place] of Service:  Clinic (12)  Provider:   Code Status:  Goals of Care:  Advanced Directives 04/10/2019  Does Patient Have a Medical Advance Directive? Yes  Type of Paramedic of [place];Out of facility DNR (pink MOST or yellow form);Living will  Does patient want to make changes to medical advance directive? No - Patient declined  Copy of [place] in Chart? Yes - validated most recent copy scanned in chart (See row information)  Would patient like information on creating a medical advance directive? -  Pre-existing out of facility DNR order (yellow form or pink MOST form) Pink MOST form placed in chart (order not valid for inpatient use)     Chief Complaint  Patient presents with  . Medical Management of Chronic Issues    Patient returns to the clinic for his 4 month follow up.  George [name] Health Maintenance    Urine microalbumin    HPI: Patient is a 86 y.o. male seen today for medical management of chronic diseases.    Patient has h/o Diabetes mellitus,LE edema, Right Frozen Shoulder,And B12 def Post Prandial Dizziness Echo in the past is Normal EF  Patient seen for his regular visit.  Lives in [place] and friends home Very independent still drives.  Does not use cane or walker.  Lives with his wife.  George [name]  His POA is the daughter who lives in [place].  Doing well. No Complains today.  Weight is stable. Appetite is good No recent falls. Walks with no assist He wanted to talk about his wife today who is struggling with falls and Dementia. She is right now in SNF.He wants her back with him in apartment and feels comfortable taking care of her   Past Medical History:  Diagnosis Date  . Abdominal pain, other specified site   . Allergic rhinitis due to pollen   . Benign paroxysmal positional vertigo   . Disturbance of skin sensation    left great toe  . Elevated prostate specific antigen (PSA)   . Hematuria,  unspecified   . Hypertrophy of prostate without urinary obstruction and other lower urinary tract symptoms (LUTS)   . Impotence of organic origin   . Left knee pain 05/28/2014  . Macrocytosis   . Other and unspecified hyperlipidemia   . Other malaise and fatigue   . Spermatocele    bilateral  . Type II or unspecified type diabetes mellitus without mention of complication, uncontrolled   . Unspecified essential hypertension   . Unspecified glaucoma(365.9)     Past Surgical History:  Procedure Laterality Date  . CATARACT EXTRACTION EXTRACAPSULAR  2008   bilateraly Dr. Katy Fitch  . COLONOSCOPY  01/03/2002   normal Dr. Earlean Shawl  . PROSTATE BIOPSY  1995   due to elevated PSA normal  . TONSILLECTOMY      Allergies  Allergen Reactions  . Metformin And Related Other (See Comments)    unknown  . Sulfa Antibiotics Swelling  . Tetanus Toxoids Other (See Comments)    unknown  . Typhoid Vaccines Other (See Comments)    unknown    Outpatient Encounter Medications as of 10/01/2020  Medication Sig  . aspirin 81 MG tablet Take 81 mg by mouth daily.  George [name] glucose blood test strip Use one strip to test blood sugar once a day before breakfast . Dx E11.9  . MICROLET LANCETS MISC Use one lancet each morning to test blood sugar. Dx.  E11.9  . pioglitazone (ACTOS) 30 MG tablet TAKE 1 TABLET BY MOUTH EVERY DAY  . sitaGLIPtin (JANUVIA) 100 MG tablet TAKE ONE TABLET BY MOUTH ONCE EVERY MORNING TO CONTROL DIABETES   No facility-administered encounter medications on file as of 10/01/2020.    Review of Systems:  Review of Systems  Review of Systems  Constitutional: Negative for activity change, appetite change, chills, diaphoresis, fatigue and fever.  HENT: Negative for mouth sores, postnasal drip, rhinorrhea, sinus pain and sore throat.   Respiratory: Negative for apnea, cough, chest tightness, shortness of breath and wheezing.   Cardiovascular: Negative for chest pain, palpitations and leg swelling.    Gastrointestinal: Negative for abdominal distention, abdominal pain, constipation, diarrhea, nausea and vomiting.  Genitourinary: Negative for dysuria and frequency.  Musculoskeletal: Negative for arthralgias, joint swelling and myalgias.  Skin: Negative for rash.  Neurological: Negative for dizziness, syncope, weakness, light-headedness and numbness.  Psychiatric/Behavioral: Negative for behavioral problems, confusion and sleep disturbance.     Health Maintenance  Topic Date Due  . FOOT EXAM  11/09/2018  . URINE MICROALBUMIN  05/26/2019  . TETANUS/TDAP  02/05/2021 (Originally 10/07/1943)  . OPHTHALMOLOGY EXAM  03/18/2021  . HEMOGLOBIN A1C  03/26/2021  . INFLUENZA VACCINE  Completed  . COVID-19 Vaccine  Completed  . PNA vac Low Risk Adult  Completed    Physical Exam: Vitals:   10/01/20 1301  BP: 110/62  Pulse: 93  Temp: (!) 96 F (35.6 C)  SpO2: 96%  Weight: 149 lb 3.2 oz (67.7 kg)  Height: 5\' 6"  (1.676 m)   Body mass index is 24.08 kg/m. Physical Exam  Constitutional: Oriented to person, place, and time. Well-developed and well-nourished.  HENT:  Head: Normocephalic.  Mouth/Throat: Oropharynx is clear and moist.  Eyes: Pupils are equal, round, and reactive to light.  Neck: Neck supple.  Cardiovascular: Normal rate and normal heart sounds.  No murmur heard. Pulmonary/Chest: Effort normal and breath sounds normal. No respiratory distress. No wheezes. Has no rales.  Abdominal: Soft. Bowel sounds are normal. No distension. There is no tenderness. There is no rebound.  Musculoskeletal: Mild Edema Bilateral Lymphadenopathy: none Neurological: Alert and oriented to person, place, and time.  Skin: Skin is warm and dry.  Psychiatric: Normal mood and affect. Behavior is normal. Thought content normal.    Labs reviewed: Basic Metabolic Panel: Recent Labs    05/22/20 0000 09/25/20 0820  NA 141 141  K 4.2 4.1  CL 103 105  CO2 28 25  GLUCOSE 126* 134*  BUN 26* 20   CREATININE 0.71 0.78  CALCIUM 8.8 8.6  TSH 2.65  --    Liver Function Tests: Recent Labs    05/22/20 0000 09/25/20 0820  AST 16 15  ALT 9 7*  BILITOT 0.8 0.9  PROT 6.4 6.3   No results for input(s): LIPASE, AMYLASE in the last 8760 hours. No results for input(s): AMMONIA in the last 8760 hours. CBC: Recent Labs    01/31/20 0735 05/22/20 0000  WBC 6.7 5.8  NEUTROABS 3,886 3,364  HGB 13.2 12.3*  HCT 39.4 37.2*  MCV 101.0* 100.0  PLT 313 358   Lipid Panel: Recent Labs    05/22/20 0000  CHOL 140  HDL 62  LDLCALC 64  TRIG 56  CHOLHDL 2.3   Lab Results  Component Value Date   HGBA1C 7.5 (H) 09/25/2020    Procedures since last visit: No results found.  Assessment/Plan Type 2 diabetes mellitus with diabetic neuropathy, without long-term current use  of insulin ([place]) A1C Good limit for him On Actos and Januvia Sees Ophthalmologist and Podiatrist Follow up in 4 months Leg swelling Ted hoses PRN B12 deficiency Off supplement Will repeat the levels  Up to date on all vaccinations Allergic to Tetanus toxoid Shingrix 7 years ago    Labs/tests ordered:  * No order type specified * Next appt:  Visit date not found

## 2020-11-05 ENCOUNTER — Encounter: Payer: Self-pay | Admitting: Internal Medicine

## 2020-11-05 ENCOUNTER — Other Ambulatory Visit: Payer: Self-pay

## 2020-11-05 ENCOUNTER — Non-Acute Institutional Stay: Payer: Medicare Other | Admitting: Internal Medicine

## 2020-11-05 VITALS — BP 100/58 | HR 76 | Temp 96.9°F

## 2020-11-05 DIAGNOSIS — H01005 Unspecified blepharitis left lower eyelid: Secondary | ICD-10-CM

## 2020-11-05 DIAGNOSIS — H1033 Unspecified acute conjunctivitis, bilateral: Secondary | ICD-10-CM | POA: Diagnosis not present

## 2020-11-05 DIAGNOSIS — H01002 Unspecified blepharitis right lower eyelid: Secondary | ICD-10-CM

## 2020-11-05 MED ORDER — OFLOXACIN 0.3 % OP SOLN: 1.0000 [drp] | Freq: Four times a day (QID) | OPHTHALMIC | 0 refills | Status: DC

## 2020-11-05 NOTE — Progress Notes (Signed)
Location: [place] of Service:  Clinic (12)  Provider:   Code Status:  Goals of Care:  Advanced Directives 04/10/2019  Does Patient Have a Medical Advance Directive? Yes  Type of Paramedic of [place];Out of facility DNR (pink MOST or yellow form);Living will  Does patient want to make changes to medical advance directive? No - Patient declined  Copy of [place] in Chart? Yes - validated most recent copy scanned in chart (See row information)  Would patient like information on creating a medical advance directive? -  Pre-existing out of facility DNR order (yellow form or pink MOST form) Pink MOST form placed in chart (order not valid for inpatient use)     Chief Complaint  Patient presents with  . Acute Visit    Red and itchy eyes    HPI: Patient is a 86 y.o. [name] seen today for an acute visit for Red and itchy eye  Patient has h/o Diabetes mellitus,LE edema, Right Frozen Shoulder,And B12 def Post Prandial Dizziness Echo in the past is Normal EF  C/o Both Eyes itchy, Burning and discharge  No Pain or Vision complains Has tried OTC antihistamines which did not  Help     Past Medical History:  Diagnosis Date  . Abdominal pain, other specified site   . Allergic rhinitis due to pollen   . Benign paroxysmal positional vertigo   . Disturbance of skin sensation    left great toe  . Elevated prostate specific antigen (PSA)   . Hematuria, unspecified   . Hypertrophy of prostate without urinary obstruction and other lower urinary tract symptoms (LUTS)   . Impotence of organic origin   . Left knee pain 05/28/2014  . Macrocytosis   . Other and unspecified hyperlipidemia   . Other malaise and fatigue   . Spermatocele    bilateral  . Type II or unspecified type diabetes mellitus without mention of complication, uncontrolled   . Unspecified essential hypertension   . Unspecified glaucoma(365.9)     Past  Surgical History:  Procedure Laterality Date  . CATARACT EXTRACTION EXTRACAPSULAR  2008   bilateraly Dr. Katy Fitch  . COLONOSCOPY  01/03/2002   normal Dr. Earlean Shawl  . PROSTATE BIOPSY  1995   due to elevated PSA normal  . TONSILLECTOMY      Allergies  Allergen Reactions  . Metformin And Related Other (See Comments)    unknown  . Sulfa Antibiotics Swelling  . Tetanus Toxoids Other (See Comments)    unknown  . Typhoid Vaccines Other (See Comments)    unknown    Outpatient Encounter Medications as of 11/05/2020  Medication Sig  . aspirin George MG tablet Take George mg by mouth daily.  Marland Kitchen glucose blood test strip Use one strip to test blood sugar once a day before breakfast . Dx E11.9  . MICROLET LANCETS MISC Use one lancet each morning to test blood sugar. Dx. E11.9  . ofloxacin (OCUFLOX) 0.3 % ophthalmic solution Place 1 drop into both eyes 4 (four) times daily.  . pioglitazone (ACTOS) 30 MG tablet TAKE 1 TABLET BY MOUTH EVERY DAY  . sitaGLIPtin (JANUVIA) 100 MG tablet TAKE ONE TABLET BY MOUTH ONCE EVERY MORNING TO CONTROL DIABETES   No facility-administered encounter medications on file as of 11/05/2020.    Review of Systems:  Review of Systems  Constitutional: Negative.   HENT: Negative.   Eyes: Positive for discharge, redness and itching. Negative for photophobia,  pain and visual disturbance.  Respiratory: Negative.   Cardiovascular: Negative.   Gastrointestinal: Negative.   Genitourinary: Negative.   Musculoskeletal: Negative.   Neurological: Negative.   Psychiatric/Behavioral: Negative.     Health Maintenance  Topic Date Due  . FOOT EXAM  11/09/2018  . URINE MICROALBUMIN  05/26/2019  . TETANUS/TDAP  02/05/2021 (Originally 10/07/1943)  . OPHTHALMOLOGY EXAM  03/18/2021  . HEMOGLOBIN A1C  03/26/2021  . INFLUENZA VACCINE  Completed  . COVID-19 Vaccine  Completed  . PNA vac Low Risk Adult  Completed    Physical Exam: Vitals:   11/05/20 1500  BP: (!) 100/58  Pulse: 76    Temp: (!) 96.9 F (36.1 C)  SpO2: 96%   There is no height or weight on file to calculate BMI. Physical Exam  Constitutional: Oriented to person, place, and time. Well-developed and well-nourished.  HENT:  Head: Normocephalic.  Mouth/Throat: Oropharynx is clear and moist.  Eyes: Pupils are equal, round, and reactive to light.  Mild Redness in both Eyes with redness of Lower eyelids Neck: Neck supple.  Cardiovascular: Normal rate and normal heart sounds.  No murmur heard. Pulmonary/Chest: Effort normal and breath sounds normal. No respiratory distress. No wheezes. She has no rales.  Abdominal: Soft. Bowel sounds are normal. No distension. There is no tenderness. There is no rebound.  Musculoskeletal: No edema.  Lymphadenopathy: none Neurological: Alert and oriented to person, place, and time.  Skin: Skin is warm and dry.  Psychiatric: Normal mood and affect. Behavior is normal. Thought content normal.   Labs reviewed: Basic Metabolic Panel: Recent Labs    05/22/20 0000 09/25/20 0820  NA 141 141  K 4.2 4.1  CL 103 105  CO2 28 25  GLUCOSE 126* 134*  BUN 26* 20  CREATININE 0.71 0.78  CALCIUM 8.8 8.6  TSH 2.65  --    Liver Function Tests: Recent Labs    05/22/20 0000 09/25/20 0820  AST 16 15  ALT 9 7*  BILITOT 0.8 0.9  PROT 6.4 6.3   No results for input(s): LIPASE, AMYLASE in the last 8760 hours. No results for input(s): AMMONIA in the last 8760 hours. CBC: Recent Labs    01/31/20 0735 05/22/20 0000  WBC 6.7 5.8  NEUTROABS 3,886 3,364  HGB 13.2 12.3*  HCT 39.4 37.2*  MCV 101.0* 100.0  PLT 313 358   Lipid Panel: Recent Labs    05/22/20 0000  CHOL 140  HDL 62  LDLCALC 64  TRIG 56  CHOLHDL 2.3   Lab Results  Component Value Date   HGBA1C 7.5 (H) 09/25/2020    Procedures since last visit: No results found.  Assessment/Plan 1. Acute conjunctivitis of both eyes, unspecified acute conjunctivitis type Ocuflox QID for 2 weeks  2. Blepharitis  of lower eyelids of both eyes, unspecified type Ocuflox QID for 2 week  If not better will call us in 2 weeks    Labs/tests ordered:  * No order type specified * Next appt:  01/29/2021

## 2020-11-07 ENCOUNTER — Telehealth: Payer: Self-pay

## 2020-11-07 NOTE — Telephone Encounter (Signed)
No He is confused. I did not say anything else beside the Eye drops. The Eye drops should clear his eyes

## 2020-11-07 NOTE — Telephone Encounter (Signed)
Incoming call received from patient stating he seen Dr.Gupta on 11/05/2020 and she told him she was going to order some type of salve for his eye as well as eye drops.  Patient picked up rx for eye drops, yet the pharmacy was unable to locate a rx for a salve.  Patient would like for Dr.Gupta to submit rx at this time and requested a return call when complete  Please advise

## 2020-11-07 NOTE — Telephone Encounter (Signed)
Discussed response with patient and he states he must have misunderstood Dr.Gupta.  Patient appeared grateful for the quick reply

## 2020-11-27 ENCOUNTER — Other Ambulatory Visit: Payer: Self-pay | Admitting: *Deleted

## 2020-11-27 DIAGNOSIS — Z794 Long term (current) use of insulin: Secondary | ICD-10-CM

## 2020-11-27 DIAGNOSIS — E119 Type 2 diabetes mellitus without complications: Secondary | ICD-10-CM

## 2020-11-27 MED ORDER — SITAGLIPTIN PHOSPHATE 100 MG PO TABS: ORAL_TABLET | ORAL | 1 refills | Status: DC

## 2020-11-27 MED ORDER — PIOGLITAZONE HCL 30 MG PO TABS
30.0000 mg | ORAL_TABLET | Freq: Every day | ORAL | 1 refills | Status: DC
Start: 2020-11-27 — End: 2021-02-03

## 2020-11-27 NOTE — Telephone Encounter (Signed)
[facility] sent [employer].

## 2020-11-27 NOTE — Addendum Note (Signed)
Addended by: Rafael Bihari A on: 11/27/2020 02:12 PM   Modules accepted: Orders

## 2020-12-01 DIAGNOSIS — Z23 Encounter for immunization: Secondary | ICD-10-CM | POA: Diagnosis not present

## 2020-12-21 ENCOUNTER — Other Ambulatory Visit: Payer: Self-pay | Admitting: Internal Medicine

## 2020-12-21 DIAGNOSIS — Z794 Long term (current) use of insulin: Secondary | ICD-10-CM

## 2020-12-21 DIAGNOSIS — E119 Type 2 diabetes mellitus without complications: Secondary | ICD-10-CM

## 2021-01-25 ENCOUNTER — Other Ambulatory Visit: Payer: Self-pay | Admitting: Nurse Practitioner

## 2021-01-29 ENCOUNTER — Other Ambulatory Visit: Payer: Self-pay

## 2021-01-29 DIAGNOSIS — E538 Deficiency of other specified B group vitamins: Secondary | ICD-10-CM | POA: Diagnosis not present

## 2021-01-29 DIAGNOSIS — E1140 Type 2 diabetes mellitus with diabetic neuropathy, unspecified: Secondary | ICD-10-CM | POA: Diagnosis not present

## 2021-01-29 DIAGNOSIS — M7989 Other specified soft tissue disorders: Secondary | ICD-10-CM | POA: Diagnosis not present

## 2021-01-30 LAB — CBC WITH DIFFERENTIAL/PLATELET
Absolute Monocytes: 788 cells/uL (ref 200–950)
Basophils Absolute: 57 cells/uL (ref 0–200)
Basophils Relative: 0.9 %
Eosinophils Absolute: 107 cells/uL (ref 15–500)
Eosinophils Relative: 1.7 %
HCT: 39.6 % (ref 38.5–50.0)
Hemoglobin: 13.3 g/dL (ref 13.2–17.1)
Lymphs Abs: 1707 cells/uL (ref 850–3900)
MCH: 34.6 pg — ABNORMAL HIGH (ref 27.0–33.0)
MCHC: 33.6 g/dL (ref 32.0–36.0)
MCV: 103.1 fL — ABNORMAL HIGH (ref 80.0–100.0)
MPV: 9.8 fL (ref 7.5–12.5)
Monocytes Relative: 12.5 %
Neutro Abs: 3641 cells/uL (ref 1500–7800)
Neutrophils Relative %: 57.8 %
Platelets: 280 10*3/uL (ref 140–400)
RBC: 3.84 10*6/uL — ABNORMAL LOW (ref 4.20–5.80)
RDW: 17.7 % — ABNORMAL HIGH (ref 11.0–15.0)
Total Lymphocyte: 27.1 %
WBC: 6.3 10*3/uL (ref 3.8–10.8)

## 2021-01-30 LAB — COMPLETE METABOLIC PANEL WITH GFR
AG Ratio: 1.7 (calc) (ref 1.0–2.5)
ALT: 9 U/L (ref 9–46)
AST: 15 U/L (ref 10–35)
Albumin: 4.1 g/dL (ref 3.6–5.1)
Alkaline phosphatase (APISO): 73 U/L (ref 35–144)
BUN: 23 mg/dL (ref 7–25)
CO2: 28 mmol/L (ref 20–32)
Calcium: 9.1 mg/dL (ref 8.6–10.3)
Chloride: 106 mmol/L (ref 98–110)
Creat: 0.76 mg/dL (ref 0.70–1.11)
GFR, Est African American: 91 mL/min/{1.73_m2} (ref >=60)
GFR, Est Non African American: 78 mL/min/{1.73_m2} (ref >=60)
Globulin: 2.4 g/dL (calc) (ref 1.9–3.7)
Glucose, Bld: 129 mg/dL — ABNORMAL HIGH (ref 65–99)
Potassium: 4.4 mmol/L (ref 3.5–5.3)
Sodium: 140 mmol/L (ref 135–146)
Total Bilirubin: 0.7 mg/dL (ref 0.2–1.2)
Total Protein: 6.5 g/dL (ref 6.1–8.1)

## 2021-01-30 LAB — HEMOGLOBIN A1C
Hgb A1c MFr Bld: 7 % of total Hgb — ABNORMAL HIGH (ref <5.7)
Mean Plasma Glucose: 154 mg/dL
eAG (mmol/L): 8.5 mmol/L

## 2021-01-30 LAB — LIPID PANEL
Cholesterol: 147 mg/dL (ref <200)
HDL: 65 mg/dL (ref >=40)
LDL Cholesterol (Calc): 64 mg/dL (calc)
Non-HDL Cholesterol (Calc): 82 mg/dL (calc) (ref <130)
Total CHOL/HDL Ratio: 2.3 (calc) (ref <5.0)
Triglycerides: 101 mg/dL (ref <150)

## 2021-01-30 LAB — VITAMIN B12: Vitamin B-12: 1176 pg/mL — ABNORMAL HIGH (ref 200–1100)

## 2021-02-03 ENCOUNTER — Other Ambulatory Visit: Payer: Self-pay | Admitting: Nurse Practitioner

## 2021-02-04 ENCOUNTER — Encounter: Payer: Self-pay | Admitting: Internal Medicine

## 2021-02-04 ENCOUNTER — Other Ambulatory Visit: Payer: Self-pay

## 2021-02-04 ENCOUNTER — Non-Acute Institutional Stay: Payer: Medicare Other | Admitting: Internal Medicine

## 2021-02-04 VITALS — BP 110/56 | HR 94 | Temp 96.7°F | Ht 66.0 in | Wt 148.4 lb

## 2021-02-04 DIAGNOSIS — R200 Anesthesia of skin: Secondary | ICD-10-CM | POA: Diagnosis not present

## 2021-02-04 DIAGNOSIS — E1140 Type 2 diabetes mellitus with diabetic neuropathy, unspecified: Secondary | ICD-10-CM

## 2021-02-04 DIAGNOSIS — E538 Deficiency of other specified B group vitamins: Secondary | ICD-10-CM

## 2021-02-04 NOTE — Progress Notes (Signed)
Location:  [place] of Service:  Clinic (12)  Provider:   Code Status:  Goals of Care:  Advanced Directives 04/10/2019  Does Patient Have a Medical Advance Directive? Yes  Type of Paramedic of [place];Out of facility DNR (pink MOST or yellow form);Living will  Does patient want to make changes to medical advance directive? No - Patient declined  Copy of [place] in Chart? Yes - validated most recent copy scanned in chart (See row information)  Would patient like information on creating a medical advance directive? -  Pre-existing out of facility DNR order (yellow form or pink MOST form) Pink MOST form placed in chart (order not valid for inpatient use)     Chief Complaint  Patient presents with  . Medical Management of Chronic Issues    Patient returns to the clinic for follow up.     HPI: Patient is a 87 y.o. male seen today for medical management of chronic diseases.    Patient has h/o Diabetes mellitus,LE edema, Right Frozen Shoulder,And B12 def Post Prandial Dizziness Echo in the past is Normal EF  Patient seen for his regular visit. Lives in [place] and friends home Very independent still drives. Does not use cane or walker. Lives with his wife. Marland Kitchen His POA is the daughter who lives in [place].  Acute issue Has noticed some numbness in Left Hand coming down from the Left shoulder Saw Chiropractor who said it is his neck but he could not help him Not noticed any weakness.  Does not have any Neck pain   Past Medical History:  Diagnosis Date  . Abdominal pain, other specified site   . Allergic rhinitis due to pollen   . Benign paroxysmal positional vertigo   . Disturbance of skin sensation    left great toe  . Elevated prostate specific antigen (PSA)   . Hematuria, unspecified   . Hypertrophy of prostate without urinary obstruction and other lower urinary tract symptoms (LUTS)   . Impotence of organic  origin   . Left knee pain 05/28/2014  . Macrocytosis   . Other and unspecified hyperlipidemia   . Other malaise and fatigue   . Spermatocele    bilateral  . Type II or unspecified type diabetes mellitus without mention of complication, uncontrolled   . Unspecified essential hypertension   . Unspecified glaucoma(365.9)     Past Surgical History:  Procedure Laterality Date  . CATARACT EXTRACTION EXTRACAPSULAR  2008   bilateraly Dr. Katy Fitch  . COLONOSCOPY  01/03/2002   normal Dr. Earlean Shawl  . PROSTATE BIOPSY  1995   due to elevated PSA normal  . TONSILLECTOMY      Allergies  Allergen Reactions  . Metformin And Related Other (See Comments)    unknown  . Sulfa Antibiotics Swelling  . Tetanus Toxoids Other (See Comments)    unknown  . Typhoid Vaccines Other (See Comments)    unknown    Outpatient Encounter Medications as of 02/04/2021  Medication Sig  . aspirin 81 MG tablet Take 81 mg by mouth daily.  Marland Kitchen glucose blood test strip Use one strip to test blood sugar once a day before breakfast . Dx E11.9  . MICROLET LANCETS MISC Use one lancet each morning to test blood sugar. Dx. E11.9  . pioglitazone (ACTOS) 30 MG tablet TAKE 1 TABLET BY MOUTH EVERY DAY  . sitaGLIPtin (JANUVIA) 100 MG tablet TAKE ONE TABLET BY MOUTH ONCE EVERY MORNING  TO CONTROL DIABETES  . [DISCONTINUED] ofloxacin (OCUFLOX) 0.3 % ophthalmic solution Place 1 drop into both eyes 4 (four) times daily.   No facility-administered encounter medications on file as of 02/04/2021.    Review of Systems:  Review of Systems  Review of Systems  Constitutional: Negative for activity change, appetite change, chills, diaphoresis, fatigue and fever.  HENT: Negative for mouth sores, postnasal drip, rhinorrhea, sinus pain and sore throat.   Respiratory: Negative for apnea, cough, chest tightness, shortness of breath and wheezing.   Cardiovascular: Negative for chest pain, palpitations and leg swelling.  Gastrointestinal: Negative for  abdominal distention, abdominal pain, constipation, diarrhea, nausea and vomiting.  Genitourinary: Negative for dysuria and frequency.  Musculoskeletal: Negative for arthralgias, joint swelling and myalgias.  Skin: Negative for rash.  Neurological: Negative for dizziness, syncope, weakness, light-headedness Psychiatric/Behavioral: Negative for behavioral problems, confusion and sleep disturbance.     Health Maintenance  Topic Date Due  . FOOT EXAM  11/09/2018  . URINE MICROALBUMIN  05/26/2019  . COVID-19 Vaccine (3 - Booster for Moderna series) 07/20/2020  . TETANUS/TDAP  02/05/2021 (Originally 10/07/1943)  . OPHTHALMOLOGY EXAM  03/18/2021  . HEMOGLOBIN A1C  07/29/2021  . INFLUENZA VACCINE  Completed  . PNA vac Low Risk Adult  Completed    Physical Exam: Vitals:   02/04/21 1258  BP: (!) 110/56  Pulse: 94  Temp: (!) 96.7 F (35.9 C)  SpO2: 99%  Weight: 148 lb 6.4 oz (67.3 kg)  Height: 5\' 6"  (1.676 m)   Body mass index is 23.95 kg/m. Physical Exam  Constitutional: Oriented to person, place, and time. Well-developed and well-nourished.  HENT:  Head: Normocephalic.  Mouth/Throat: Oropharynx is clear and moist.  Eyes: Pupils are equal, round, and reactive to light.  Neck: Neck supple.  Cardiovascular: Normal rate and normal heart sounds.  No murmur heard. Pulmonary/Chest: Effort normal and breath sounds normal. No respiratory distress. No wheezes. She has no rales.  Abdominal: Soft. Bowel sounds are normal. No distension. There is no tenderness. There is no rebound.  Musculoskeletal: No edema.  Lymphadenopathy: none Neurological: Alert and oriented to person, place, and time.  Gait stable Had Good strength in his Both UE No signs of any Neuropathy Skin: Skin is warm and dry.  Psychiatric: Normal mood and affect. Behavior is normal. Thought content normal.    Labs reviewed: Basic Metabolic Panel: Recent Labs    05/22/20 0000 09/25/20 0820 01/29/21 0800  NA 141  141 140  K 4.2 4.1 4.4  CL 103 105 106  CO2 28 25 28   GLUCOSE 126* 134* 129*  BUN 26* 20 23  CREATININE 0.71 0.78 0.76  CALCIUM 8.8 8.6 9.1  TSH 2.65  --   --    Liver Function Tests: Recent Labs    05/22/20 0000 09/25/20 0820 01/29/21 0800  AST 16 15 15   ALT 9 7* 9  BILITOT 0.8 0.9 0.7  PROT 6.4 6.3 6.5   No results for input(s): LIPASE, AMYLASE in the last 8760 hours. No results for input(s): AMMONIA in the last 8760 hours. CBC: Recent Labs    05/22/20 0000 01/29/21 0800  WBC 5.8 6.3  NEUTROABS 3,364 3,641  HGB 12.3* 13.3  HCT 37.2* 39.6  MCV 100.0 103.1*  PLT 358 280   Lipid Panel: Recent Labs    05/22/20 0000 01/29/21 0800  CHOL 140 147  HDL 62 65  LDLCALC 64 64  TRIG 56 101  CHOLHDL 2.3 2.3   Lab Results  Component Value Date   HGBA1C 7.0 (H) 01/29/2021    Procedures since last visit: No results found.  Assessment/Plan 1. Type 2 diabetes mellitus with diabetic neuropathy, without long-term current use of insulin (HCC) Continue on Actos and Januvia Yearly follows with Ophthalmologist and Podiatrist  - Hemoglobin A1c; Future - CBC with Differential/Platelet; Future - TSH; Future  2. Left arm numbness ? Cervical  stenosis Does not want any testing Continue to monitor  3. B12 deficiency Repeat levels were Normal - TSH; Future Leg swelling Uses Ted hoses PRN  Up to date on all vaccinations Allergic to Tetanus toxoid Shingrix 7 years ago   Labs/tests ordered:  * No order type specified * Next appt:  Visit date not found

## 2021-02-11 ENCOUNTER — Telehealth: Payer: Self-pay

## 2021-02-11 DIAGNOSIS — G8929 Other chronic pain: Secondary | ICD-10-CM

## 2021-02-11 DIAGNOSIS — M25512 Pain in left shoulder: Secondary | ICD-10-CM

## 2021-02-11 NOTE — Telephone Encounter (Signed)
Left arm has been numb and tingling now radiating down side and hip. He is having problems walking and was wondering which specialist he should see.

## 2021-02-13 NOTE — Telephone Encounter (Signed)
Let him know that I am Making referal to Orthopedics for Shoulder and Hip Pain

## 2021-02-16 NOTE — Telephone Encounter (Signed)
Discussed with the patient. He has been scheduled already but needed to know where to go. Details for [facility] given to patient.

## 2021-02-20 ENCOUNTER — Telehealth: Payer: Self-pay | Admitting: *Deleted

## 2021-02-20 NOTE — Telephone Encounter (Signed)
[place], called and stated that patient is having severe Shoulder and Leg pain. Stated that he has an appointment next week with Orthopaedic Stated that the Tylenol is not helping with pain and wants to speak with Dr. Lyndel Safe.  Patient is requesting for Dr. Lyndel Safe to call him regarding giving him something different for the pain.   Please Advise.

## 2021-02-20 NOTE — Telephone Encounter (Signed)
Discussed with the Patient that he has pain in his shoulder area/ He thinks he has pulled a muscle. I have told him ot take Alleve 1 tablet BID with meals for 3 days till he sees ortho on Tues

## 2021-02-24 ENCOUNTER — Encounter: Payer: Self-pay | Admitting: Orthopaedic Surgery

## 2021-02-24 ENCOUNTER — Ambulatory Visit (INDEPENDENT_AMBULATORY_CARE_PROVIDER_SITE_OTHER): Payer: Medicare Other

## 2021-02-24 ENCOUNTER — Ambulatory Visit (INDEPENDENT_AMBULATORY_CARE_PROVIDER_SITE_OTHER): Payer: Medicare Other | Admitting: Orthopaedic Surgery

## 2021-02-24 VITALS — Ht 66.0 in | Wt 148.0 lb

## 2021-02-24 DIAGNOSIS — M5416 Radiculopathy, lumbar region: Secondary | ICD-10-CM

## 2021-02-24 DIAGNOSIS — M5412 Radiculopathy, cervical region: Secondary | ICD-10-CM

## 2021-02-24 MED ORDER — DICLOFENAC SODIUM 75 MG PO TBEC: 75.0000 mg | DELAYED_RELEASE_TABLET | Freq: Two times a day (BID) | ORAL | 2 refills | Status: DC | PRN

## 2021-02-24 MED ORDER — PREDNISONE 5 MG (21) PO TBPK: ORAL_TABLET | ORAL | 0 refills | Status: DC

## 2021-02-24 NOTE — Progress Notes (Signed)
Office Visit Note   Patient: George [name]           Date of Birth: 06/28/1924           MRN: [id number] Visit Date: 02/24/2021              Requested by: Virgie Dad, MD [place],  [place] [id number] PCP: Virgie Dad, MD   Assessment & Plan: Visit Diagnoses:  1. Radiculopathy of cervical spine   2. Lumbar radiculopathy     Plan: Impression is left upper and left lower extremity radiculopathy.  We have discussed starting the patient on a steroid taper and sending him to outpatient physical therapy.  Referral has been made.  He will follow up with [place] as needed.  Follow-Up Instructions: Return if symptoms worsen or fail to improve.   Orders:  Orders Placed This Encounter  Procedures  . XR Cervical Spine 2 or 3 views  . XR Lumbar Spine 2-3 Views  . Ambulatory referral to Physical Therapy   Meds ordered this encounter  Medications  . predniSONE (STERAPRED UNI-PAK 21 TAB) 5 MG (21) TBPK tablet    Sig: Take as directed    Dispense:  21 tablet    Refill:  0  . diclofenac (VOLTAREN) 75 MG EC tablet    Sig: Take 1 tablet (75 mg total) by mouth 2 (two) times daily as needed. Do not take until after you have finished the prednisone    Dispense:  60 tablet    Refill:  2      Procedures: No procedures performed   Clinical Data: No additional findings.   Subjective: Chief Complaint  Patient presents with  . Left Leg - Pain  . Left Arm - Pain    HPI patient is a pleasant 87 year old gentleman who comes in today with left arm and left leg pain.  In regards to the left arm he has had pain and tingling for the past few months without any specific injury or change in activity.  The symptoms start from the left lateral neck and radiate into his fingers.  He denies any weakness.  Moving his neck specifically touching his ear is left shoulder seems to aggravate his symptoms most.  He has been taking Aleve without relief.  No previous history of cervical  spine pathology.  In regards to the left leg, he has had pain for the past month.  The pain is actually to the left buttocks and radiates down the back of his leg.  Standing as well as lying down seems to aggravate his symptoms.  No weakness to the left lower extremity.  Aleve does not seem to help his symptoms.  He denies any paresthesias to the left lower extremity.  No bowel or bladder change or saddle paresthesias.  No history of epidural steroid injection or surgical intervention.  Review of Systems as detailed in HPI.  All others reviewed and are negative.   Objective: Vital Signs: Ht 5\' 6"  (1.676 m)   Wt 148 lb (67.1 kg)   BMI 23.89 kg/m   Physical Exam well-developed well-nourished gentleman in no acute distress.  Alert oriented x3.  Ortho Exam left shoulder exam is unremarkable.  Cervical spine exam shows no spinous tenderness he does have moderate left-sided paraspinous tenderness as well as tenderness of the parascapular region.  Increased pain with cervical rotation to the left.  No focal weakness.  Lumbar exam shows no spinous or paraspinous tenderness.  Negative straight leg raise.  No focal weakness.  He is neurovascular intact distally.  Specialty Comments:  No specialty comments available.  Imaging: XR Cervical Spine 2 or 3 views  Result Date: 02/24/2021 Advanced multilevel degenerative changes  XR Lumbar Spine 2-3 Views  Result Date: 02/24/2021 Advanced multilevel degenerative changes with retrolisthesis L5 on S1    PMFS History: Patient Active Problem List   Diagnosis Date Noted  . Memory loss 11/09/2017  . Paresthesia 10/12/2016  . Macrocytosis 10/12/2016  . Light-headed feeling 09/21/2016  . Right knee pain 07/01/2015  . Left knee pain 05/28/2014  . Deaf 01/22/2014  . DM type 2 (diabetes mellitus, type 2) ([place])   . Hyperlipidemia   . Benign paroxysmal positional vertigo   . Essential hypertension   . BPH (benign prostatic hyperplasia)   . Impotence of  organic origin    Past Medical History:  Diagnosis Date  . Abdominal pain, other specified site   . Allergic rhinitis due to pollen   . Benign paroxysmal positional vertigo   . Disturbance of skin sensation    left great toe  . Elevated prostate specific antigen (PSA)   . Hematuria, unspecified   . Hypertrophy of prostate without urinary obstruction and other lower urinary tract symptoms (LUTS)   . Impotence of organic origin   . Left knee pain 05/28/2014  . Macrocytosis   . Other and unspecified hyperlipidemia   . Other malaise and fatigue   . Spermatocele    bilateral  . Type II or unspecified type diabetes mellitus without mention of complication, uncontrolled   . Unspecified essential hypertension   . Unspecified glaucoma(365.9)     Family History  Problem Relation Age of Onset  . Cancer Father        lung  . Cancer Brother        adrenal gland  . Cancer Son        liver    Past Surgical History:  Procedure Laterality Date  . CATARACT EXTRACTION EXTRACAPSULAR  2008   bilateraly Dr. Katy Fitch  . COLONOSCOPY  01/03/2002   normal Dr. Earlean Shawl  . PROSTATE BIOPSY  1995   due to elevated PSA normal  . TONSILLECTOMY     Social History   Occupational History  . Occupation: retired, self employed  Tobacco Use  . Smoking status: Former Smoker    Years: 4.00    Quit date: 04/09/1948    Years since quitting: 72.9  . Smokeless tobacco: Never Used  Vaping Use  . Vaping Use: Never used  Substance and Sexual Activity  . Alcohol use: No  . Drug use: No  . Sexual activity: Yes

## 2021-02-26 ENCOUNTER — Encounter: Payer: Self-pay | Admitting: Rehabilitative and Restorative Service Providers"

## 2021-02-26 ENCOUNTER — Ambulatory Visit (INDEPENDENT_AMBULATORY_CARE_PROVIDER_SITE_OTHER): Payer: Medicare Other | Admitting: Rehabilitative and Restorative Service Providers"

## 2021-02-26 ENCOUNTER — Other Ambulatory Visit: Payer: Self-pay

## 2021-02-26 DIAGNOSIS — M5412 Radiculopathy, cervical region: Secondary | ICD-10-CM | POA: Diagnosis not present

## 2021-02-26 DIAGNOSIS — R293 Abnormal posture: Secondary | ICD-10-CM | POA: Diagnosis not present

## 2021-02-26 DIAGNOSIS — R262 Difficulty in walking, not elsewhere classified: Secondary | ICD-10-CM

## 2021-02-26 DIAGNOSIS — M5416 Radiculopathy, lumbar region: Secondary | ICD-10-CM

## 2021-02-26 NOTE — Patient Instructions (Signed)
Access Code: [email or web address] URL: [email or web address] Date: 02/26/2021 Prepared by: Vista Mink  Exercises Standing Scapular Retraction - 5 x daily - 7 x weekly - 1 sets - 5 reps - 5 second hold Standing Isometric Cervical Extension with Manual Resistance - 5 x daily - 7 x weekly - 1 sets - 5 reps - 5 hold  Walking with a walker, work up to 20+ minutes as long as radicular symptoms do not increase.

## 2021-02-26 NOTE — Therapy (Signed)
[facility] Physical Therapy [address] [place], [place], [address] Phone: [phone]   Fax:  [phone]  Physical Therapy Evaluation  Patient Details  Name: George [name] MRN: [id number] Date of Birth: 1924/11/17 Referring Provider (PT): Aundra Dubin PA-C   Encounter Date: 02/26/2021   PT End of Session - 02/26/21 1734    Visit Number 1    Number of Visits 24    Date for PT Re-Evaluation 04/23/21    PT Start Time 1100    PT Stop Time 1140    PT Time Calculation (min) 40 min    Activity Tolerance Patient tolerated treatment well;Patient limited by pain    Behavior During Therapy [facility] for tasks assessed/performed           Past Medical History:  Diagnosis Date  . Abdominal pain, other specified site   . Allergic rhinitis due to pollen   . Benign paroxysmal positional vertigo   . Disturbance of skin sensation    left great toe  . Elevated prostate specific antigen (PSA)   . Hematuria, unspecified   . Hypertrophy of prostate without urinary obstruction and other lower urinary tract symptoms (LUTS)   . Impotence of organic origin   . Left knee pain 05/28/2014  . Macrocytosis   . Other and unspecified hyperlipidemia   . Other malaise and fatigue   . Spermatocele    bilateral  . Type II or unspecified type diabetes mellitus without mention of complication, uncontrolled   . Unspecified essential hypertension   . Unspecified glaucoma(365.9)     Past Surgical History:  Procedure Laterality Date  . CATARACT EXTRACTION EXTRACAPSULAR  2008   bilateraly Dr. Katy Fitch  . COLONOSCOPY  01/03/2002   normal Dr. Earlean Shawl  . PROSTATE BIOPSY  1995   due to elevated PSA normal  . TONSILLECTOMY      There were no vitals filed for this visit.    Subjective Assessment - 02/26/21 1105    Subjective L sided upper and lower extremity to the hand and foot.  Has been getting worse and he would like to see it resolve or decrease in intensity.    Patient is  accompained by: Family member    Pertinent History No previous surgeries    Limitations House hold activities;Lifting;Standing;Walking    How long can you sit comfortably? As long as he likes    How long can you stand comfortably? < 1 minute    How long can you walk comfortably? < 1 minute, can go longer but radicular symptoms are present    Diagnostic tests X-Ray cervical and lumbar (DDD/DJD)    Patient Stated Goals Have less arm and leg pain and paresthesias    Currently in Pain? Yes    Pain Score 6     Pain Location Leg    Pain Orientation Left    Pain Descriptors / Indicators Burning;Numbness;Pins and needles;Tingling    Pain Type Chronic pain    Pain Radiating Towards L foot    Pain Onset More than a month ago    Pain Frequency Occasional    Aggravating Factors  Standing and walking    Pain Relieving Factors Sitting and flexing    Effect of Pain on Daily Activities Limits WB function    Multiple Pain Sites Yes    Pain Score 6    Pain Location Arm    Pain Orientation Left    Pain Descriptors / Indicators Burning;Numbness;Pins and needles;Tingling    Pain Type  Chronic pain    Pain Radiating Towards L hand and fingers    Pain Onset More than a month ago    Pain Frequency Intermittent    Aggravating Factors  Extension    Pain Relieving Factors Flexion    Effect of Pain on Daily Activities Limits L UE use              [facility] PT Assessment - 02/26/21 0001      Assessment   Medical Diagnosis Cervical and lumbar radiculopathy    Referring Provider (PT) Aundra Dubin PA-C    Onset Date/Surgical Date --   Chronic     Balance Screen   Has the patient fallen in the past 6 months No    Has the patient had a decrease in activity level because of a fear of falling?  No    Is the patient reluctant to leave their home because of a fear of falling?  No      Home Environment   Living Environment Assisted living      Prior Function   Level of Independence Independent       Cognition   Overall Cognitive Status Within Functional Limits for tasks assessed      Observation/Other Assessments   Focus on Therapeutic Outcomes (FOTO)  46 (Goal 57)      ROM / Strength   AROM / PROM / Strength AROM      AROM   Overall AROM  Deficits    AROM Assessment Site Cervical;Lumbar    Cervical Extension 0    Cervical - Right Side Bend 10    Cervical - Left Side Bend 0    Cervical - Right Rotation 40    Cervical - Left Rotation 35    Lumbar Extension 5                      Objective measurements completed on examination: See above findings.       [place] Adult PT Treatment/Exercise - 02/26/21 0001      Therapeutic Activites    Therapeutic Activities Other Therapeutic Activities    Other Therapeutic Activities Discussed spine anatomy, imaging, posture and prescribed walking with walker to reduce LE radiculopathy along with cervical exercises      Exercises   Exercises Lumbar;Neck      Neck Exercises: Seated   Cervical Isometrics Extension;5 secs;10 reps    Other Seated Exercise Shoulder blade pinches 10X 5 seconds                  PT Education - 02/26/21 1732    Education Details Discussed exam findings, imaging and the importance of avoiding flexed postures even though he gets relief with them (will laed to other worse problems).  Reviewed traction implications and starter HEP.    Person(s) Educated Spouse;Patient    Methods Explanation;Demonstration;Tactile cues;Verbal cues;Handout    Comprehension Verbal cues required;Need further instruction;Returned demonstration;Verbalized understanding;Tactile cues required            PT Short Term Goals - 02/26/21 1739      PT SHORT TERM GOAL #1   Title George [name] will report a decrease in upper and lower extremity radicular symptoms when standing and walking for < 5 minutes.    Baseline < 1 minute    Time 4    Period Weeks    Status New    Target Date 03/27/21  PT Long Term  Goals - 02/26/21 1740      PT LONG TERM GOAL #1   Title Improve FOTO to 57.    Baseline 46    Time 8    Period Weeks    Status New    Target Date 04/24/21      PT LONG TERM GOAL #2   Title George [name] will be able to stand and walk for 10+ minutes without an increase in radicular symptoms at DC.    Baseline < 1 minute    Time 8    Period Weeks    Status New    Target Date 04/24/21      PT LONG TERM GOAL #3   Title Improve cervical AROM for extension to 40 degrees and rotation to 45 degrees.    Baseline 0 and < 40 respectively    Time 8    Period Weeks    Status New    Target Date 04/24/21      PT LONG TERM GOAL #4   Title Improve trunk extension to 10 degrees without increased radicular L leg symptoms.    Baseline -5 degrees    Time 8    Period Weeks    Status New    Target Date 04/24/21      PT LONG TERM GOAL #5   Title George [name] will be independent and compliant with his HEP.    Time 8    Period Weeks    Status New    Target Date 04/24/21                  Plan - 02/26/21 1735    Clinical Impression Statement George [name] has had increasing radicular symptoms of his L upper and lower extremities for several weeks.  He is able to function and does not want surgery.  We discussed that even though he gets relief with flexion, he runs the risk of causing secondary postural issues and disc issues.  We discussed using cervical and lumbar traction, postural correction, select exercises for strengthening and walking with a walker as a good place to start his rehabilitation.  He helps care for his elderly wife and he is very motivated to do what is needed to meet long-term goals.    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  Ease into extension AROM for postural correction.    PT Home Exercise Plan Access Code: [email or web address]    Consulted and Agree with Plan of Care Family member/caregiver;Patient    Family Member Consulted Wife           Patient will benefit from skilled therapeutic intervention in order to improve the following deficits and impairments:  Abnormal gait,Decreased activity tolerance,Decreased endurance,Decreased range of motion,Decreased mobility,Decreased strength,Difficulty walking,Hypomobility,Impaired flexibility,Impaired UE functional use,Postural dysfunction,Improper body mechanics,Pain  Visit Diagnosis: Abnormal posture  Difficulty walking  Radiculopathy, cervical region  Radiculopathy, lumbar region     Problem List Patient Active Problem List   Diagnosis Date Noted  . Memory loss 11/09/2017  . Paresthesia  10/12/2016  . Macrocytosis 10/12/2016  . Light-headed feeling 09/21/2016  . Right knee pain 07/01/2015  . Left knee pain 05/28/2014  . Deaf 01/22/2014  . DM type 2 (diabetes mellitus, type 2) ([place])   . Hyperlipidemia   . Benign paroxysmal positional vertigo   . Essential hypertension   . BPH (benign prostatic hyperplasia)   . Impotence of organic origin     Farley Ly PT, MPT 02/26/2021, 5:44 PM  [facility] Physical Therapy [address] [place], [place], [address] Phone: [phone]   Fax:  [phone]  Name: George [name] MRN: [id number] Date of Birth: 18-Oct-1924

## 2021-03-04 ENCOUNTER — Encounter: Payer: Self-pay | Admitting: Physical Therapy

## 2021-03-04 ENCOUNTER — Other Ambulatory Visit: Payer: Self-pay

## 2021-03-04 ENCOUNTER — Ambulatory Visit (INDEPENDENT_AMBULATORY_CARE_PROVIDER_SITE_OTHER): Payer: Medicare Other | Admitting: Physical Therapy

## 2021-03-04 DIAGNOSIS — M5416 Radiculopathy, lumbar region: Secondary | ICD-10-CM | POA: Diagnosis not present

## 2021-03-04 DIAGNOSIS — R262 Difficulty in walking, not elsewhere classified: Secondary | ICD-10-CM

## 2021-03-04 DIAGNOSIS — M5412 Radiculopathy, cervical region: Secondary | ICD-10-CM | POA: Diagnosis not present

## 2021-03-04 DIAGNOSIS — R293 Abnormal posture: Secondary | ICD-10-CM

## 2021-03-04 NOTE — Patient Instructions (Signed)
Access Code: [email or web address] URL: [email or web address] Date: 03/04/2021 Prepared by: Faustino Congress  Exercises Standing Scapular Retraction - 5 x daily - 7 x weekly - 1 sets - 5 reps - 5 second hold Standing Isometric Cervical Extension with Manual Resistance - 5 x daily - 7 x weekly - 1 sets - 5 reps - 5 hold Seated Figure 4 Piriformis Stretch - 2-3 x daily - 7 x weekly - 1 sets - 3 reps - 30 sec hold

## 2021-03-04 NOTE — Therapy (Signed)
[facility] Physical Therapy [address] [place], [place], [address] Phone: [phone]   Fax:  [phone]  Physical Therapy Treatment  Patient Details  Name: George [name] MRN: [id number] Date of Birth: Dec 05, 1924 Referring Provider (PT): Aundra Dubin PA-C   Encounter Date: 03/04/2021   PT End of Session - 03/04/21 0924    Visit Number 2    Number of Visits 24    Date for PT Re-Evaluation 04/23/21    PT Start Time 0840    PT Stop Time 0921    PT Time Calculation (min) 41 min    Activity Tolerance Patient tolerated treatment well;Patient limited by pain    Behavior During Therapy [facility] for tasks assessed/performed           Past Medical History:  Diagnosis Date  . Abdominal pain, other specified site   . Allergic rhinitis due to pollen   . Benign paroxysmal positional vertigo   . Disturbance of skin sensation    left great toe  . Elevated prostate specific antigen (PSA)   . Hematuria, unspecified   . Hypertrophy of prostate without urinary obstruction and other lower urinary tract symptoms (LUTS)   . Impotence of organic origin   . Left knee pain 05/28/2014  . Macrocytosis   . Other and unspecified hyperlipidemia   . Other malaise and fatigue   . Spermatocele    bilateral  . Type II or unspecified type diabetes mellitus without mention of complication, uncontrolled   . Unspecified essential hypertension   . Unspecified glaucoma(365.9)     Past Surgical History:  Procedure Laterality Date  . CATARACT EXTRACTION EXTRACAPSULAR  2008   bilateraly Dr. Katy Fitch  . COLONOSCOPY  01/03/2002   normal Dr. Earlean Shawl  . PROSTATE BIOPSY  1995   due to elevated PSA normal  . TONSILLECTOMY      There were no vitals filed for this visit.   Subjective Assessment - 03/04/21 0842    Subjective hurting about the same - has done exercises as instructed but no change.  standing pain comes up the leg    Patient is accompained by: Family member    Pertinent  History No previous surgeries    Limitations House hold activities;Lifting;Standing;Walking    How long can you sit comfortably? As long as he likes    How long can you stand comfortably? < 1 minute    How long can you walk comfortably? < 1 minute, can go longer but radicular symptoms are present    Diagnostic tests X-Ray cervical and lumbar (DDD/DJD)    Patient Stated Goals Have less arm and leg pain and paresthesias    Pain Score 0-No pain   up to 5/10 with standing   Pain Location Leg    Pain Orientation Left    Pain Descriptors / Indicators Burning;Numbness;Pins and needles;Tingling    Pain Type Chronic pain    Pain Onset More than a month ago    Pain Frequency Occasional    Aggravating Factors  standing and walking    Pain Relieving Factors sitting and flexing    Pain Score 8    Pain Location Arm    Pain Orientation Left    Pain Descriptors / Indicators Sharp;Aching;Tingling    Pain Onset More than a month ago    Pain Frequency Intermittent    Aggravating Factors  extension    Pain Relieving Factors flexion  [facility] Adult PT Treatment/Exercise - 03/04/21 0847      Neck Exercises: Seated   Shoulder Rolls Backwards;20 reps    Other Seated Exercise Shoulder blade pinches 10X 5 seconds    Other Seated Exercise shoulder external rotation with scap retraction 10 x 5 sec hold      Neck Exercises: Supine   Neck Retraction 10 reps;5 secs      Lumbar Exercises: Stretches   Piriformis Stretch Right;Left;2 reps;20 seconds    Piriformis Stretch Limitations seated figure-4      Lumbar Exercises: Seated   Other Seated Lumbar Exercises attempted seated lumbar ant pelvic tilts for slight extension - difficulty with isolating movement      Modalities   Modalities Traction   increased time needed to get into position     Traction   Type of Traction Cervical   2 pillow cases folded up on headrest   Min (lbs) 13    Max (lbs) 18-22    Hold  Time 60    Rest Time 20    Time 12                  PT Education - 03/04/21 0924    Education Details added piriformis stretch    Person(s) Educated Patient    Methods Explanation;Demonstration;Handout    Comprehension Verbalized understanding;Returned demonstration;Need further instruction            PT Short Term Goals - 02/26/21 1739      PT SHORT TERM GOAL #1   Title [name] will report a decrease in upper and lower extremity radicular symptoms when standing and walking for < 5 minutes.    Baseline < 1 minute    Time 4    Period Weeks    Status New    Target Date 03/27/21             PT Long Term Goals - 02/26/21 1740      PT LONG TERM GOAL #1   Title Improve FOTO to 57.    Baseline 46    Time 8    Period Weeks    Status New    Target Date 04/24/21      PT LONG TERM GOAL #2   Title [name] will be able to stand and walk for 10+ minutes without an increase in radicular symptoms at DC.    Baseline < 1 minute    Time 8    Period Weeks    Status New    Target Date 04/24/21      PT LONG TERM GOAL #3   Title Improve cervical AROM for extension to 40 degrees and rotation to 45 degrees.    Baseline 0 and < 40 respectively    Time 8    Period Weeks    Status New    Target Date 04/24/21      PT LONG TERM GOAL #4   Title Improve trunk extension to 10 degrees without increased radicular L leg symptoms.    Baseline -5 degrees    Time 8    Period Weeks    Status New    Target Date 04/24/21      PT LONG TERM GOAL #5   Title [name] will be independent and compliant with his HEP.    Time 8    Period Weeks    Status New    Target Date 04/24/21  Plan - 03/04/21 0925    Clinical Impression Statement Pt reported no change in symptoms initially and is compliant with HEP.  Trial of cervical traction today as upper extremity was most painful today.  He reported decrease in symptoms during traction, but reported pain 8/10 following  traction.  He did seem to tolerate movement better after traction.  Trial of seated lumbar extension but difficulty performing task.  Added pirifomis stretch to HEP today.  Will continue to benefit from PT to maximize function.    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  Ease into extension AROM for postural correction.    PT Home Exercise Plan Access Code: [email or web address]    Consulted and Agree with Plan of Care Family member/caregiver;Patient    Family Member Consulted Wife           Patient will benefit from skilled therapeutic intervention in order to improve the following deficits and impairments:  Abnormal gait,Decreased activity tolerance,Decreased endurance,Decreased range of motion,Decreased mobility,Decreased strength,Difficulty walking,Hypomobility,Impaired flexibility,Impaired UE functional use,Postural dysfunction,Improper body mechanics,Pain  Visit Diagnosis: Abnormal posture  Difficulty walking  Radiculopathy, cervical region  Radiculopathy, lumbar region     Problem List Patient Active Problem List   Diagnosis Date Noted  . Memory loss 11/09/2017  . Paresthesia 10/12/2016  . Macrocytosis 10/12/2016  . Light-headed feeling 09/21/2016  . Right knee pain 07/01/2015  . Left knee pain 05/28/2014  . Deaf  01/22/2014  . DM type 2 (diabetes mellitus, type 2) ([place])   . Hyperlipidemia   . Benign paroxysmal positional vertigo   . Essential hypertension   . BPH (benign prostatic hyperplasia)   . Impotence of organic origin       Laureen Abrahams, PT, DPT 03/04/21 9:27 AM    [facility] Physical Therapy [address] [place], [place], [address] Phone: [phone]   Fax:  [phone]  Name: George [name] MRN: [id number] Date of Birth: July 11, 1924

## 2021-03-06 DIAGNOSIS — L600 Ingrowing nail: Secondary | ICD-10-CM | POA: Diagnosis not present

## 2021-03-06 DIAGNOSIS — L84 Corns and callosities: Secondary | ICD-10-CM | POA: Diagnosis not present

## 2021-03-06 DIAGNOSIS — E1159 Type 2 diabetes mellitus with other circulatory complications: Secondary | ICD-10-CM | POA: Diagnosis not present

## 2021-03-09 ENCOUNTER — Encounter: Payer: Self-pay | Admitting: Physical Therapy

## 2021-03-09 ENCOUNTER — Other Ambulatory Visit: Payer: Self-pay

## 2021-03-09 ENCOUNTER — Ambulatory Visit (INDEPENDENT_AMBULATORY_CARE_PROVIDER_SITE_OTHER): Payer: Medicare Other | Admitting: Physical Therapy

## 2021-03-09 DIAGNOSIS — M5412 Radiculopathy, cervical region: Secondary | ICD-10-CM | POA: Diagnosis not present

## 2021-03-09 DIAGNOSIS — M5416 Radiculopathy, lumbar region: Secondary | ICD-10-CM

## 2021-03-09 DIAGNOSIS — R262 Difficulty in walking, not elsewhere classified: Secondary | ICD-10-CM | POA: Diagnosis not present

## 2021-03-09 DIAGNOSIS — R293 Abnormal posture: Secondary | ICD-10-CM | POA: Diagnosis not present

## 2021-03-09 NOTE — Therapy (Signed)
[facility] Physical Therapy [address] [place], [place], [address] Phone: [phone]   Fax:  [phone]  Physical Therapy Treatment  Patient Details  Name: George [name] MRN: [id number] Date of Birth: [birth year] Referring Provider (PT): George Dubin PA-C   Encounter Date: 03/09/2021   PT End of Session - 03/09/21 1028    Visit Number 3    Number of Visits 24    Date for PT Re-Evaluation 04/23/21    PT Start Time 0930    PT Stop Time 1008    PT Time Calculation (min) 38 min    Activity Tolerance Patient tolerated treatment well;Patient limited by pain    Behavior During Therapy [facility] for tasks assessed/performed           Past Medical History:  Diagnosis Date  . Abdominal pain, other specified site   . Allergic rhinitis due to pollen   . Benign paroxysmal positional vertigo   . Disturbance of skin sensation    left great toe  . Elevated prostate specific antigen (PSA)   . Hematuria, unspecified   . Hypertrophy of prostate without urinary obstruction and other lower urinary tract symptoms (LUTS)   . Impotence of organic origin   . Left knee pain 05/28/2014  . Macrocytosis   . Other and unspecified hyperlipidemia   . Other malaise and fatigue   . Spermatocele    bilateral  . Type II or unspecified type diabetes mellitus without mention of complication, uncontrolled   . Unspecified essential hypertension   . Unspecified glaucoma(365.9)     Past Surgical History:  Procedure Laterality Date  . CATARACT EXTRACTION EXTRACAPSULAR  2008   bilateraly Dr. Katy Fitch  . COLONOSCOPY  01/03/2002   normal Dr. Earlean Shawl  . PROSTATE BIOPSY  1995   due to elevated PSA normal  . TONSILLECTOMY      There were no vitals filed for this visit.   Subjective Assessment - 03/09/21 0932    Subjective pain is worse than before -  feels the traction made it worse.    Patient is accompained by: Family member    Pertinent History No previous surgeries    Limitations  House hold activities;Lifting;Standing;Walking    How long can you sit comfortably? As long as he likes    How long can you stand comfortably? < 1 minute    How long can you walk comfortably? < 1 minute, can go longer but radicular symptoms are present    Diagnostic tests X-Ray cervical and lumbar (DDD/DJD)    Patient Stated Goals Have less arm and leg pain and paresthesias    Currently in Pain? Yes    Pain Onset More than a month ago    Pain Score 8    Pain Location Arm    Pain Orientation Left    Pain Descriptors / Indicators Aching;Tingling;Sharp    Pain Type Chronic pain    Pain Onset More than a month ago    Pain Frequency Intermittent    Aggravating Factors  extension    Pain Relieving Factors flexion                             [facility] Adult PT Treatment/Exercise - 03/09/21 0937      Neck Exercises: Theraband   Shoulder External Rotation 15 reps;Red   sitting   Horizontal ABduction 15 reps;Red   Lt only     Neck Exercises: Seated   Shoulder  Flexion Left;15 reps      Neck Exercises: Supine   Neck Retraction 15 reps;5 secs   hands behind head     Neck Exercises: Sidelying   Other Sidelying Exercise Lt shoulder ER x 5 reps - unable to tolerate more      Lumbar Exercises: Stretches   Standing Extension 10 reps;5 seconds    Standing Extension Limitations limited motion - counter behind back with hands on counter    Piriformis Stretch Right;Left;2 reps;20 seconds    Piriformis Stretch Limitations seated figure-4      Lumbar Exercises: Seated   Sit to Stand 10 reps   no UE support     Manual Therapy   Manual Therapy Soft tissue mobilization    Manual therapy comments skilled palpation and monitoring of soft tissue during DN    Soft tissue mobilization STM and compression to Lt UT - in sitting      Neck Exercises: Stretches   Upper Trapezius Stretch Left;30 seconds;3 reps            Trigger Point Dry Needling - 03/09/21 0957    Consent Given?  Yes    Education Handout Provided Yes    Muscles Treated Head and Neck Upper trapezius    Upper Trapezius Response Twitch reponse elicited                PT Education - 03/09/21 1017    Education Details HEP    Person(s) Educated Patient    Methods Explanation;Demonstration;Handout    Comprehension Verbalized understanding;Returned demonstration;Need further instruction            PT Short Term Goals - 02/26/21 1739      PT SHORT TERM GOAL #1   Title George [name] will report a decrease in upper and lower extremity radicular symptoms when standing and walking for < 5 minutes.    Baseline < 1 minute    Time 4    Period Weeks    Status New    Target Date 03/27/21             PT Long Term Goals - 02/26/21 1740      PT LONG TERM GOAL #1   Title Improve FOTO to 57.    Baseline 46    Time 8    Period Weeks    Status New    Target Date 04/24/21      PT LONG TERM GOAL #2   Title George [name] will be able to stand and walk for 10+ minutes without an increase in radicular symptoms at DC.    Baseline < 1 minute    Time 8    Period Weeks    Status New    Target Date 04/24/21      PT LONG TERM GOAL #3   Title Improve cervical AROM for extension to 40 degrees and rotation to 45 degrees.    Baseline 0 and < 40 respectively    Time 8    Period Weeks    Status New    Target Date 04/24/21      PT LONG TERM GOAL #4   Title Improve trunk extension to 10 degrees without increased radicular L leg symptoms.    Baseline -5 degrees    Time 8    Period Weeks    Status New    Target Date 04/24/21      PT LONG TERM GOAL #5   Title George [name] will be independent and compliant with his HEP.  Time 8    Period Weeks    Status New    Target Date 04/24/21                 Plan - 03/09/21 1028    Clinical Impression Statement Pt reporting no change in symtoms today following traction and almost feels as if symptoms are worse.  He also reports no improvement following streroids and  has limited tolerance to PT exercises due to elevated pain levels.  Will discuss with primary PT, but may need follow up to referring provider for additional options.  Did trial DN to upper trap today to see if this is beneficial as active trigger point noted.    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[employer];Reach Overhead;Stand    Examination-Participation Restrictions Interpersonal Relationship;Community Activity    Stability/Clinical Decision Making Stable/Uncomplicated    Rehab Potential Good    PT Frequency Other (comment)   2-3X/week   PT Duration 8 weeks    PT Treatment/Interventions ADLs/Self Care Home Management;Moist Heat;Cryotherapy;Traction;Therapeutic activities;Gait training;Therapeutic exercise;Neuromuscular re-education;Patient/family education;Manual techniques;Dry needling    PT Next Visit Plan Cervical (assess response) and lumbar traction - realy unable to tolerate supine/prone for extended time to performe.  Ease into extension AROM for postural correction.; see if DN was helpful    PT Home Exercise Plan Access Code: [email or web address]    Consulted and Agree with Plan of Care Family member/caregiver;Patient    Family Member Consulted Wife           Patient will benefit from skilled therapeutic intervention in order to improve the following deficits and impairments:  Abnormal gait,Decreased activity tolerance,Decreased endurance,Decreased range of motion,Decreased mobility,Decreased strength,Difficulty walking,Hypomobility,Impaired flexibility,Impaired UE functional use,Postural dysfunction,Improper body mechanics,Pain  Visit Diagnosis: Abnormal posture  Difficulty walking  Radiculopathy, cervical region  Radiculopathy, lumbar region     Problem List Patient Active Problem List   Diagnosis Date Noted  . Memory loss 11/09/2017  . Paresthesia 10/12/2016  . Macrocytosis  10/12/2016  . Light-headed feeling 09/21/2016  . Right knee pain 07/01/2015  . Left knee pain 05/28/2014  . Deaf 01/22/2014  . DM type 2 (diabetes mellitus, type 2) ([place])   . Hyperlipidemia   . Benign paroxysmal positional vertigo   . Essential hypertension   . BPH (benign prostatic hyperplasia)   . Impotence of organic origin       Laureen Abrahams, PT, DPT 03/09/21 10:31 AM     [facility] Physical Therapy [address] [place], [place], [address] Phone: [phone]   Fax:  [phone]  Name: George [name] MRN: [id number] Date of Birth: November 05, 1924

## 2021-03-09 NOTE — Patient Instructions (Signed)
Access Code: [email or web address] URL: [email or web address] Date: 03/09/2021 Prepared by: Faustino Congress  Exercises Standing Scapular Retraction - 5 x daily - 7 x weekly - 1 sets - 5 reps - 5 second hold Standing Isometric Cervical Extension with Manual Resistance - 5 x daily - 7 x weekly - 1 sets - 5 reps - 5 hold Seated Figure 4 Piriformis Stretch - 2-3 x daily - 7 x weekly - 1 sets - 3 reps - 30 sec hold Seated Upper Trapezius Stretch - 2 x daily - 7 x weekly - 3 reps - 1 sets - 30 sec hold  Patient Education Trigger Point Dry Needling

## 2021-03-13 ENCOUNTER — Encounter: Payer: Self-pay | Admitting: Rehabilitative and Restorative Service Providers"

## 2021-03-13 ENCOUNTER — Ambulatory Visit (INDEPENDENT_AMBULATORY_CARE_PROVIDER_SITE_OTHER): Payer: Medicare Other | Admitting: Rehabilitative and Restorative Service Providers"

## 2021-03-13 ENCOUNTER — Other Ambulatory Visit: Payer: Self-pay

## 2021-03-13 DIAGNOSIS — M5412 Radiculopathy, cervical region: Secondary | ICD-10-CM | POA: Diagnosis not present

## 2021-03-13 DIAGNOSIS — R293 Abnormal posture: Secondary | ICD-10-CM | POA: Diagnosis not present

## 2021-03-13 DIAGNOSIS — R262 Difficulty in walking, not elsewhere classified: Secondary | ICD-10-CM | POA: Diagnosis not present

## 2021-03-13 DIAGNOSIS — M5416 Radiculopathy, lumbar region: Secondary | ICD-10-CM

## 2021-03-13 NOTE — Patient Instructions (Signed)
Added 2 exercises to HEP to help L leg paresthesias

## 2021-03-13 NOTE — Therapy (Signed)
[facility] Physical Therapy [address] [place], [place], [address] Phone: [phone]   Fax:  [phone]  Physical Therapy Treatment  Patient Details  Name: George [name] MRN: [id number] Date of Birth: 06/02/1924 Referring Provider (PT): Aundra Dubin PA-C   Encounter Date: 03/13/2021   PT End of Session - 03/13/21 1508    Visit Number 4    Number of Visits 24    Date for PT Re-Evaluation 04/23/21    PT Start Time 1147    PT Stop Time 1229    PT Time Calculation (min) 42 min    Activity Tolerance Patient tolerated treatment well    Behavior During Therapy [facility] for tasks assessed/performed           Past Medical History:  Diagnosis Date  . Abdominal pain, other specified site   . Allergic rhinitis due to pollen   . Benign paroxysmal positional vertigo   . Disturbance of skin sensation    left great toe  . Elevated prostate specific antigen (PSA)   . Hematuria, unspecified   . Hypertrophy of prostate without urinary obstruction and other lower urinary tract symptoms (LUTS)   . Impotence of organic origin   . Left knee pain 05/28/2014  . Macrocytosis   . Other and unspecified hyperlipidemia   . Other malaise and fatigue   . Spermatocele    bilateral  . Type II or unspecified type diabetes mellitus without mention of complication, uncontrolled   . Unspecified essential hypertension   . Unspecified glaucoma(365.9)     Past Surgical History:  Procedure Laterality Date  . CATARACT EXTRACTION EXTRACAPSULAR  2008   bilateraly Dr. Katy Fitch  . COLONOSCOPY  01/03/2002   normal Dr. Earlean Shawl  . PROSTATE BIOPSY  1995   due to elevated PSA normal  . TONSILLECTOMY      There were no vitals filed for this visit.   Subjective Assessment - 03/13/21 1157    Subjective L leg only bothers George [name] now when standing, was constant 2 weeks ago.   L arm is about the same.    Patient is accompained by: Family member    Pertinent History No previous surgeries     Limitations House hold activities;Lifting;Standing;Walking    How long can you sit comfortably? As long as he likes    How long can you stand comfortably? A few minutes (was < 1 minute)    How long can you walk comfortably? < 1 minute, can go longer but radicular symptoms are present    Diagnostic tests X-Ray cervical and lumbar (DDD/DJD)    Patient Stated Goals Have less arm and leg pain and paresthesias    Currently in Pain? Yes    Pain Score 4     Pain Location Leg    Pain Orientation Left    Pain Descriptors / Indicators Pins and needles;Tingling    Pain Type Chronic pain    Pain Radiating Towards L buttocks and proximal thigh (was to foot)    Pain Onset More than a month ago    Pain Frequency Occasional    Aggravating Factors  Standing and walking    Pain Relieving Factors Sitting    Effect of Pain on Daily Activities Limits WB function    Multiple Pain Sites Yes    Pain Score 7    Pain Location Arm    Pain Orientation Left    Pain Descriptors / Indicators Tingling;Sharp    Pain Type Chronic pain  Pain Radiating Towards L hand (mostly digits 2 and 3)    Pain Onset More than a month ago    Pain Frequency Intermittent    Aggravating Factors  Extension and poor postures    Pain Relieving Factors Postural correction    Effect of Pain on Daily Activities Limits L UE function                             [facility] Adult PT Treatment/Exercise - 03/13/21 0001      Therapeutic Activites    Therapeutic Activities Other Therapeutic Activities    Other Therapeutic Activities Reviewed posture, postural awareness and HEP      Exercises   Exercises Lumbar;Neck      Neck Exercises: Seated   Cervical Isometrics Extension;5 secs;10 reps    Other Seated Exercise Shoulder blade pinches 10X 5 seconds      Neck Exercises: Supine   Neck Retraction --   Done with isometrics     Lumbar Exercises: Standing   Other Standing Lumbar Exercises Alternating hip hike 2 sets of 10   3 second hold    Other Standing Lumbar Exercises Trunk extension AROM limited range 10X 3 seconds 9clinic only)      Lumbar Exercises: Seated   Sit to Stand 10 reps;Other (comment)    Sit to Stand Limitations Slow eccentrics and exaggerate good posture at the top    Other Seated Lumbar Exercises Continued seated anterior tilts for extension 10X 5 seconds      Modalities   Modalities Traction      Traction   Type of Traction Cervical    Min (lbs) 16    Max (lbs) 25    Hold Time 10 minutes static    Time 10 minutes                  PT Education - 03/13/21 1507    Education Details Reviewed postural info, reviewed and added to HEP.    Person(s) Educated Patient;Spouse    Methods Explanation;Demonstration;Verbal cues;Handout    Comprehension Verbal cues required;Need further instruction;Verbalized understanding;Returned demonstration            PT Short Term Goals - 03/13/21 1507      PT SHORT TERM GOAL #1   Title George [name] will report a decrease in upper and lower extremity radicular symptoms when standing and walking for < 5 minutes.    Baseline < 1 minute at evaluation, standing has improved    Time 4    Period Weeks    Status Achieved    Target Date 03/27/21             PT Long Term Goals - 03/13/21 1508      PT LONG TERM GOAL #1   Title Improve FOTO to 57.    Baseline 46    Time 8    Period Weeks    Status On-going      PT LONG TERM GOAL #2   Title George [name] will be able to stand and walk for 10+ minutes without an increase in radicular symptoms at DC.    Baseline < 1 minute    Time 8    Period Weeks    Status On-going      PT LONG TERM GOAL #3   Title Improve cervical AROM for extension to 40 degrees and rotation to 45 degrees.    Baseline 0 and < 40 respectively  Time 8    Period Weeks    Status On-going      PT LONG TERM GOAL #4   Title Improve trunk extension to 10 degrees without increased radicular L leg symptoms.    Baseline -5 degrees     Time 8    Period Weeks    Status On-going      PT LONG TERM GOAL #5   Title George [name] will be independent and compliant with his HEP.    Time 8    Period Weeks    Status On-going                 Plan - 03/13/21 1509    Clinical Impression Statement George [name] notes progress with his L leg since starting PT.  Symptoms are mostly gluteal and proximal thigh (were to the foot).  Symptoms do worsen the longer he stands and walks.  L UE symptoms are similar to evaluation, although George [name] does feel like the traction may be beneficial.  No change in UE symptoms with 25# of traction today.  Goal of 28# next visit to see if we can get some centralization of UE peripheral symptoms.    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[employer];Reach Overhead;Stand    Examination-Participation Restrictions Interpersonal Relationship;Community Activity    Stability/Clinical Decision Making Stable/Uncomplicated    Rehab Potential Good    PT Frequency Other (comment)   2-3X/week   PT Duration 8 weeks    PT Treatment/Interventions ADLs/Self Care Home Management;Moist Heat;Cryotherapy;Traction;Therapeutic activities;Gait training;Therapeutic exercise;Neuromuscular re-education;Patient/family education;Manual techniques;Dry needling    PT Next Visit Plan Cervical (assess response) and lumbar traction - realy unable to tolerate supine/prone for extended time to performe.  Ease into extension AROM for postural correction; see if DN was helpful-no change    PT Home Exercise Plan Access Code: [email or web address]    Consulted and Agree with Plan of Care Family member/caregiver;Patient    Family Member Consulted Wife           Patient will benefit from skilled therapeutic intervention in order to improve the following deficits and impairments:  Abnormal gait,Decreased activity tolerance,Decreased endurance,Decreased range of  motion,Decreased mobility,Decreased strength,Difficulty walking,Hypomobility,Impaired flexibility,Impaired UE functional use,Postural dysfunction,Improper body mechanics,Pain  Visit Diagnosis: Abnormal posture  Difficulty walking  Radiculopathy, cervical region  Radiculopathy, lumbar region     Problem List Patient Active Problem List   Diagnosis Date Noted  . Memory loss 11/09/2017  . Paresthesia 10/12/2016  . Macrocytosis 10/12/2016  . Light-headed feeling 09/21/2016  . Right knee pain 07/01/2015  . Left knee pain 05/28/2014  . Deaf 01/22/2014  . DM type 2 (diabetes mellitus, type 2) ([place])   . Hyperlipidemia   . Benign paroxysmal positional vertigo   . Essential hypertension   . BPH (benign prostatic hyperplasia)   . Impotence of organic origin     Farley Ly PT, MPT 03/13/2021, 3:13 PM  [facility] Physical Therapy [address] [place], [place], [address] Phone: [phone]   Fax:  [phone]  Name: JADARION HALBIG MRN: [id number] Date of Birth: [birth year]

## 2021-03-18 ENCOUNTER — Ambulatory Visit (INDEPENDENT_AMBULATORY_CARE_PROVIDER_SITE_OTHER): Payer: Medicare Other | Admitting: Rehabilitative and Restorative Service Providers"

## 2021-03-18 ENCOUNTER — Other Ambulatory Visit: Payer: Self-pay

## 2021-03-18 ENCOUNTER — Encounter: Payer: Self-pay | Admitting: Rehabilitative and Restorative Service Providers"

## 2021-03-18 DIAGNOSIS — R293 Abnormal posture: Secondary | ICD-10-CM | POA: Diagnosis not present

## 2021-03-18 DIAGNOSIS — M5412 Radiculopathy, cervical region: Secondary | ICD-10-CM

## 2021-03-18 DIAGNOSIS — M5416 Radiculopathy, lumbar region: Secondary | ICD-10-CM | POA: Diagnosis not present

## 2021-03-18 DIAGNOSIS — R262 Difficulty in walking, not elsewhere classified: Secondary | ICD-10-CM | POA: Diagnosis not present

## 2021-03-18 NOTE — Therapy (Signed)
[facility] [address] [place], [place], [address] Phone: [phone]   Fax:  [phone]  Physical Therapy Treatment  Patient Details  Name: LIBAN GUEDES MRN: [id number] Date of Birth: Aug 03, 1924 Referring Provider (PT): Aundra Dubin PA-C   Encounter Date: 03/18/2021   PT End of Session - 03/18/21 1713    Visit Number 5    Number of Visits 24    Date for PT Re-Evaluation 04/23/21    PT Start Time 1518    PT Stop Time 1600    PT Time Calculation (min) 42 min    Activity Tolerance Patient tolerated treatment well    Behavior During Therapy [facility] for tasks assessed/performed           Past Medical History:  Diagnosis Date  . Abdominal pain, other specified site   . Allergic rhinitis due to pollen   . Benign paroxysmal positional vertigo   . Disturbance of skin sensation    left great toe  . Elevated prostate specific antigen (PSA)   . Hematuria, unspecified   . Hypertrophy of prostate without urinary obstruction and other lower urinary tract symptoms (LUTS)   . Impotence of organic origin   . Left knee pain 05/28/2014  . Macrocytosis   . Other and unspecified hyperlipidemia   . Other malaise and fatigue   . Spermatocele    bilateral  . Type II or unspecified type diabetes mellitus without mention of complication, uncontrolled   . Unspecified essential hypertension   . Unspecified glaucoma(365.9)     Past Surgical History:  Procedure Laterality Date  . CATARACT EXTRACTION EXTRACAPSULAR  2008   bilateraly Dr. Katy Fitch  . COLONOSCOPY  01/03/2002   normal Dr. Earlean Shawl  . PROSTATE BIOPSY  1995   due to elevated PSA normal  . TONSILLECTOMY      There were no vitals filed for this visit.   Subjective Assessment - 03/18/21 1708    Subjective Jimmy notes leg pain with prolonged standing.  L arm pain is constant.    Patient is accompained by: Family member    Pertinent History No previous surgeries    Limitations House hold  activities;Lifting;Standing;Walking    How long can you sit comfortably? As long as he likes    How long can you stand comfortably? A few minutes (was < 1 minute)    How long can you walk comfortably? < 1 minute, can go longer but radicular symptoms are present    Diagnostic tests X-Ray cervical and lumbar (DDD/DJD)    Patient Stated Goals Have less arm and leg pain and paresthesias    Currently in Pain? Yes    Pain Score 4     Pain Location Leg    Pain Orientation Left    Pain Descriptors / Indicators Tingling;Pins and needles    Pain Type Chronic pain    Pain Radiating Towards Can still go to foot    Pain Onset More than a month ago    Pain Frequency Occasional    Aggravating Factors  Weight-bearing    Pain Relieving Factors Resting (sitting or lying down)    Effect of Pain on Daily Activities Limits WB function or has to use a walker to eliminate leg symptoms.    Multiple Pain Sites Yes    Pain Score 7    Pain Location Arm    Pain Orientation Left    Pain Descriptors / Indicators Tingling;Sharp    Pain Type Chronic pain  Pain Radiating Towards L hand (digits 2 and 3)    Pain Onset More than a month ago    Pain Frequency Intermittent    Aggravating Factors  Head up (extension) and certain supine positions    Pain Relieving Factors Postural correction (scapular retraction)    Effect of Pain on Daily Activities Limits his ability to function with his head in neutral or extension (must maintain a slight head down posture)                             [facility] Adult PT Treatment/Exercise - 03/18/21 0001      Exercises   Exercises Lumbar;Neck      Neck Exercises: Seated   Cervical Isometrics Extension;5 secs;10 reps    Other Seated Exercise Shoulder blade pinches 10X 5 seconds      Neck Exercises: Supine   Neck Retraction --   Done with isometrics     Lumbar Exercises: Standing   Other Standing Lumbar Exercises Alternating hip hike 2 sets of 10  3 second  hold    Other Standing Lumbar Exercises Trunk extension AROM limited range 10X 3 seconds 9clinic only)      Lumbar Exercises: Seated   Sit to Stand 10 reps;Other (comment)    Sit to Stand Limitations Slow eccentrics and exaggerate good posture at the top    Other Seated Lumbar Exercises Continued seated anterior tilts for extension 10X 5 seconds      Modalities   Modalities Traction      Traction   Type of Traction Cervical    Min (lbs) 9    Max (lbs) 29    Hold Time 10 minutes static    Time 10 minutes                  PT Education - 03/18/21 1712    Education Details Reviewed HEP and home walking program.    Person(s) Educated Patient;Spouse    Methods Explanation;Verbal cues    Comprehension Verbalized understanding;Returned demonstration;Verbal cues required            PT Short Term Goals - 03/18/21 1712      PT SHORT TERM GOAL #1   Title [name] will report a decrease in upper and lower extremity radicular symptoms when standing and walking for < 5 minutes.    Baseline < 1 minute at evaluation, standing has improved with a walker    Time 4    Period Weeks    Status Achieved    Target Date 03/27/21             PT Long Term Goals - 03/18/21 1713      PT LONG TERM GOAL #1   Title Improve FOTO to 57.    Baseline 46    Time 8    Period Weeks    Status On-going      PT LONG TERM GOAL #2   Title [name] will be able to stand and walk for 10+ minutes without an increase in radicular symptoms at DC.    Baseline < 1 minute    Time 8    Period Weeks    Status On-going      PT LONG TERM GOAL #3   Title Improve cervical AROM for extension to 40 degrees and rotation to 45 degrees.    Baseline 0 and < 40 respectively    Time 8    Period Weeks  Status On-going      PT LONG TERM GOAL #4   Title Improve trunk extension to 10 degrees without increased radicular L leg symptoms.    Baseline -5 degrees    Time 8    Period Weeks    Status On-going       PT LONG TERM GOAL #5   Title [name] will be independent and compliant with his HEP.    Time 8    Period Weeks    Status On-going                 Plan - 03/18/21 1550    Clinical Impression Statement [name] notes his L leg still bothers him with prolonged weight-bearing, although he is much better with a walker.  He has been walking with a walker for longer distances without leg pain.  We will continue core and leg strength work to try to get the same result without the walker.  Little change in L UE symptoms, even with 29 pounds of traction.  Will RA in 3 visits to recommend additional PT or refer back to MD for their recommendations (shots perhaps).    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[employer];Reach Overhead;Stand    Examination-Participation Restrictions Interpersonal Relationship;Community Activity    Stability/Clinical Decision Making Stable/Uncomplicated    Rehab Potential Good    PT Frequency Other (comment)   2-3X/week   PT Duration 8 weeks    PT Treatment/Interventions ADLs/Self Care Home Management;Moist Heat;Cryotherapy;Traction;Therapeutic activities;Gait training;Therapeutic exercise;Neuromuscular re-education;Patient/family education;Manual techniques;Dry needling    PT Next Visit Plan Cervical (assess response) and lumbar traction - realy unable to tolerate supine/prone for extended time to performe.  Ease into extension AROM for postural correction; see if DN was helpful-no change    PT Home Exercise Plan Access Code: [email or web address]    Consulted and Agree with Plan of Care Family member/caregiver;Patient    Family Member Consulted Wife           Patient will benefit from skilled therapeutic intervention in order to improve the following deficits and impairments:  Abnormal gait,Decreased activity tolerance,Decreased endurance,Decreased range of motion,Decreased  mobility,Decreased strength,Difficulty walking,Hypomobility,Impaired flexibility,Impaired UE functional use,Postural dysfunction,Improper body mechanics,Pain  Visit Diagnosis: Abnormal posture  Difficulty walking  Radiculopathy, cervical region  Radiculopathy, lumbar region     Problem List Patient Active Problem List   Diagnosis Date Noted  . Memory loss 11/09/2017  . Paresthesia 10/12/2016  . Macrocytosis 10/12/2016  . Light-headed feeling 09/21/2016  . Right knee pain 07/01/2015  . Left knee pain 05/28/2014  . Deaf 01/22/2014  . DM type 2 (diabetes mellitus, type 2) ([place])   . Hyperlipidemia   . Benign paroxysmal positional vertigo   . Essential hypertension   . BPH (benign prostatic hyperplasia)   . Impotence of organic origin     Farley Ly PT, MPT 03/18/2021, 5:16 PM  [facility] [address] [place], [place], [address] Phone: [phone]   Fax:  [phone]  Name: SHONTA PHILLIS MRN: [id number] Date of Birth: 11/12/1924

## 2021-03-19 ENCOUNTER — Encounter (INDEPENDENT_AMBULATORY_CARE_PROVIDER_SITE_OTHER): Payer: Self-pay

## 2021-03-19 DIAGNOSIS — H1045 Other chronic allergic conjunctivitis: Secondary | ICD-10-CM | POA: Diagnosis not present

## 2021-03-19 DIAGNOSIS — E119 Type 2 diabetes mellitus without complications: Secondary | ICD-10-CM | POA: Diagnosis not present

## 2021-03-19 DIAGNOSIS — Z961 Presence of intraocular lens: Secondary | ICD-10-CM | POA: Diagnosis not present

## 2021-03-19 DIAGNOSIS — H04123 Dry eye syndrome of bilateral lacrimal glands: Secondary | ICD-10-CM | POA: Diagnosis not present

## 2021-03-19 DIAGNOSIS — H401131 Primary open-angle glaucoma, bilateral, mild stage: Secondary | ICD-10-CM | POA: Diagnosis not present

## 2021-03-19 DIAGNOSIS — H3530 Unspecified macular degeneration: Secondary | ICD-10-CM | POA: Diagnosis not present

## 2021-03-19 LAB — HM DIABETES EYE EXAM

## 2021-03-20 ENCOUNTER — Other Ambulatory Visit: Payer: Self-pay

## 2021-03-20 ENCOUNTER — Encounter: Payer: Self-pay | Admitting: Rehabilitative and Restorative Service Providers"

## 2021-03-20 ENCOUNTER — Ambulatory Visit (INDEPENDENT_AMBULATORY_CARE_PROVIDER_SITE_OTHER): Payer: Medicare Other | Admitting: Rehabilitative and Restorative Service Providers"

## 2021-03-20 DIAGNOSIS — R293 Abnormal posture: Secondary | ICD-10-CM | POA: Diagnosis not present

## 2021-03-20 DIAGNOSIS — M5412 Radiculopathy, cervical region: Secondary | ICD-10-CM | POA: Diagnosis not present

## 2021-03-20 DIAGNOSIS — M5416 Radiculopathy, lumbar region: Secondary | ICD-10-CM | POA: Diagnosis not present

## 2021-03-20 DIAGNOSIS — R262 Difficulty in walking, not elsewhere classified: Secondary | ICD-10-CM | POA: Diagnosis not present

## 2021-03-20 NOTE — Therapy (Signed)
[facility] [address] [place], [place], [address] Phone: [phone]   Fax:  [phone]  Physical Therapy Treatment  Patient Details  Name: George [name] MRN: [id number] Date of Birth: 09-Aug-1924 Referring Provider (PT): Aundra Dubin PA-C   Encounter Date: 03/20/2021   PT End of Session - 03/20/21 1607    Visit Number 6    Number of Visits 24    Date for PT Re-Evaluation 04/23/21    PT Start Time 1028    PT Stop Time 1101    PT Time Calculation (min) 33 min    Activity Tolerance Patient tolerated treatment well    Behavior During Therapy [facility] for tasks assessed/performed           Past Medical History:  Diagnosis Date  . Abdominal pain, other specified site   . Allergic rhinitis due to pollen   . Benign paroxysmal positional vertigo   . Disturbance of skin sensation    left great toe  . Elevated prostate specific antigen (PSA)   . Hematuria, unspecified   . Hypertrophy of prostate without urinary obstruction and other lower urinary tract symptoms (LUTS)   . Impotence of organic origin   . Left knee pain 05/28/2014  . Macrocytosis   . Other and unspecified hyperlipidemia   . Other malaise and fatigue   . Spermatocele    bilateral  . Type II or unspecified type diabetes mellitus without mention of complication, uncontrolled   . Unspecified essential hypertension   . Unspecified glaucoma(365.9)     Past Surgical History:  Procedure Laterality Date  . CATARACT EXTRACTION EXTRACAPSULAR  2008   bilateraly Dr. Katy Fitch  . COLONOSCOPY  01/03/2002   normal Dr. Earlean Shawl  . PROSTATE BIOPSY  1995   due to elevated PSA normal  . TONSILLECTOMY      There were no vitals filed for this visit.   Subjective Assessment - 03/20/21 1601    Subjective L leg pain is better with the walker when walking.  Sleep is unaffected.  L arm pain is unchanged.    Patient is accompained by: Family member    Pertinent History No previous surgeries     Limitations House hold activities;Lifting;Standing;Walking    How long can you sit comfortably? As long as he likes    How long can you stand comfortably? A few minutes (was < 1 minute)    How long can you walk comfortably? < 1 minute, can go longer but radicular symptoms are present    Diagnostic tests X-Ray cervical and lumbar (DDD/DJD)    Patient Stated Goals Have less arm and leg pain and paresthesias    Currently in Pain? Yes    Pain Score 4     Pain Location Leg    Pain Orientation Left    Pain Descriptors / Indicators Tingling;Pins and needles    Pain Type Chronic pain    Pain Radiating Towards To foot    Pain Onset More than a month ago    Pain Frequency Occasional    Aggravating Factors  WB    Pain Relieving Factors Resting (sit or lie down)    Effect of Pain on Daily Activities Has to use walker with longer walks to avoid/reduce L leg pain    Multiple Pain Sites Yes    Pain Score 7    Pain Location Arm    Pain Orientation Left    Pain Descriptors / Indicators Tingling;Sharp    Pain Type Chronic  pain    Pain Radiating Towards L hand (digits 2 and 3)    Pain Onset More than a month ago    Pain Frequency Intermittent    Aggravating Factors  Cervical extension and supine postures    Pain Relieving Factors Postural correction (scapular retraction and moving his L arm)    Effect of Pain on Daily Activities Jimmy must maintain a slight head down posture with scapular retraction to avoid increased L UE pain                             [facility] Adult PT Treatment/Exercise - 03/20/21 0001      Exercises   Exercises Lumbar;Neck      Neck Exercises: Seated   Cervical Isometrics Extension;5 secs;10 reps    Other Seated Exercise Shoulder blade pinches 10X 5 seconds      Lumbar Exercises: Standing   Other Standing Lumbar Exercises Alternating hip hike 2 sets of 10  3 second hold    Other Standing Lumbar Exercises Trunk extension AROM limited range 10X 3 seconds  9clinic only)      Lumbar Exercises: Seated   Sit to Stand 10 reps;Other (comment)    Sit to Stand Limitations Slow eccentrics and exaggerate good posture at the top    Other Seated Lumbar Exercises --      Modalities   Modalities Traction      Traction   Type of Traction Cervical    Min (lbs) 0    Max (lbs) 30    Hold Time 10 minutes static    Time 10 minutes                  PT Education - 03/20/21 1605    Education Details Reviewed HEP and discussed RA next visit    Person(s) Educated Patient;Spouse    Methods Explanation;Demonstration;Verbal cues    Comprehension Verbalized understanding;Returned demonstration;Need further instruction;Verbal cues required            PT Short Term Goals - 03/20/21 1605      PT SHORT TERM GOAL #1   Title [name] will report a decrease in upper and lower extremity radicular symptoms when standing and walking for < 5 minutes.    Baseline < 1 minute at evaluation, standing has improved with a walker    Time 4    Period Weeks    Status Achieved    Target Date 03/27/21             PT Long Term Goals - 03/20/21 1605      PT LONG TERM GOAL #1   Title Improve FOTO to 57.    Baseline 46    Time 8    Period Weeks    Status On-going      PT LONG TERM GOAL #2   Title [name] will be able to stand and walk for 10+ minutes without an increase in radicular symptoms at DC.    Baseline < 1 minute    Time 8    Period Weeks    Status On-going      PT LONG TERM GOAL #3   Title Improve cervical AROM for extension to 40 degrees and rotation to 45 degrees.    Baseline 0 and < 40 respectively    Time 8    Period Weeks    Status On-going      PT LONG TERM GOAL #4  Title Improve trunk extension to 10 degrees without increased radicular L leg symptoms.    Baseline -5 degrees    Time 8    Period Weeks    Status On-going      PT LONG TERM GOAL #5   Title [name] will be independent and compliant with his HEP.    Time 8    Period  Weeks    Status On-going                 Plan - 03/20/21 1607    Clinical Impression Statement Walking is more comfortable with the use of his walker.  L arm symptoms are unchanged.  Discussed a reassessment next week to see if continued PT is recommended vs referral back to the othopedic specialist for possible injections.    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[employer];Reach Overhead;Stand    Examination-Participation Restrictions Interpersonal Relationship;Community Activity    Stability/Clinical Decision Making Stable/Uncomplicated    Rehab Potential Good    PT Frequency Other (comment)   2-3X/week   PT Duration 8 weeks    PT Treatment/Interventions ADLs/Self Care Home Management;Moist Heat;Cryotherapy;Traction;Therapeutic activities;Gait training;Therapeutic exercise;Neuromuscular re-education;Patient/family education;Manual techniques;Dry needling    PT Next Visit Plan Cervical (assess response) and lumbar traction - realy unable to tolerate supine/prone for extended time to performe.  Ease into extension AROM for postural correction; see if DN was helpful-no change    PT Home Exercise Plan Access Code: [email or web address]    Consulted and Agree with Plan of Care Family member/caregiver;Patient    Family Member Consulted Wife           Patient will benefit from skilled therapeutic intervention in order to improve the following deficits and impairments:  Abnormal gait,Decreased activity tolerance,Decreased endurance,Decreased range of motion,Decreased mobility,Decreased strength,Difficulty walking,Hypomobility,Impaired flexibility,Impaired UE functional use,Postural dysfunction,Improper body mechanics,Pain  Visit Diagnosis: Abnormal posture  Difficulty walking  Radiculopathy, cervical region  Radiculopathy, lumbar region     Problem List Patient Active Problem List    Diagnosis Date Noted  . Memory loss 11/09/2017  . Paresthesia 10/12/2016  . Macrocytosis 10/12/2016  . Light-headed feeling 09/21/2016  . Right knee pain 07/01/2015  . Left knee pain 05/28/2014  . Deaf 01/22/2014  . DM type 2 (diabetes mellitus, type 2) ([place])   . Hyperlipidemia   . Benign paroxysmal positional vertigo   . Essential hypertension   . BPH (benign prostatic hyperplasia)   . Impotence of organic origin     Farley Ly PT, MPT 03/20/2021, 4:09 PM  [facility] [address] [place], [place], [address] Phone: [phone]   Fax:  [phone]  Name: EMAD BRECHTEL MRN: [id number] Date of Birth: [birth year]

## 2021-03-25 ENCOUNTER — Encounter: Payer: Self-pay | Admitting: Rehabilitative and Restorative Service Providers"

## 2021-03-25 ENCOUNTER — Other Ambulatory Visit: Payer: Self-pay

## 2021-03-25 ENCOUNTER — Ambulatory Visit (INDEPENDENT_AMBULATORY_CARE_PROVIDER_SITE_OTHER): Payer: Medicare Other | Admitting: Rehabilitative and Restorative Service Providers"

## 2021-03-25 DIAGNOSIS — R262 Difficulty in walking, not elsewhere classified: Secondary | ICD-10-CM

## 2021-03-25 DIAGNOSIS — M5416 Radiculopathy, lumbar region: Secondary | ICD-10-CM | POA: Diagnosis not present

## 2021-03-25 DIAGNOSIS — M5412 Radiculopathy, cervical region: Secondary | ICD-10-CM

## 2021-03-25 DIAGNOSIS — R293 Abnormal posture: Secondary | ICD-10-CM

## 2021-03-25 NOTE — Patient Instructions (Signed)
Access Code: [email or web address] URL: [email or web address] Date: 03/25/2021 Prepared by: Vista Mink  Exercises Standing Scapular Retraction - 6-10 x daily - 7 x weekly - 1 sets - 5 reps - 5 second hold Standing Isometric Cervical Extension with Manual Resistance - 6-10 x daily - 7 x weekly - 1 sets - 5 reps - 5 hold Seated Upper Trapezius Stretch - 2 x daily - 7 x weekly - 3 reps - 1 sets - 5 seconds hold Sit to Stand with Armchair - 2 x daily - 7 x weekly - 1 sets - 10 reps Standing Hip Hiking - 2 x daily - 7 x weekly - 1-2 sets - 10 reps - 3 seconds hold Standing Lumbar Extension at Wall - Forearms - 3-5 x daily - 7 x weekly - 1 sets - 5 reps - 3 seconds hold  Patient Education Trigger Point Dry Needling

## 2021-03-25 NOTE — Therapy (Signed)
[facility] [address] [place], [place], [address] Phone: [phone]   Fax:  [phone]  Physical Therapy Treatment/Discharge  Patient Details  Name: DAXEN LANUM MRN: [id number] Date of Birth: 1924-11-28 Referring Provider (PT): Aundra Dubin PA-C  PHYSICAL THERAPY DISCHARGE SUMMARY  Visits from Start of Care: 7  Current functional level related to goals / functional outcomes: See note   Remaining deficits: See note   Education / Equipment: HEP Updated.  Progress noted but further gains will be slow at best. Plan: Patient agrees to discharge.  Patient goals were partially met. Patient is being discharged due to lack of progress.  ?????     Encounter Date: 03/25/2021    PT End of Session - 03/25/21 1406    Visit Number 7    Number of Visits 24    Date for PT Re-Evaluation 04/23/21    PT Start Time 1300    PT Stop Time 1341    PT Time Calculation (min) 41 min    Activity Tolerance Patient tolerated treatment well    Behavior During Therapy [facility] for tasks assessed/performed           Past Medical History:  Diagnosis Date  . Abdominal pain, other specified site   . Allergic rhinitis due to pollen   . Benign paroxysmal positional vertigo   . Disturbance of skin sensation    left great toe  . Elevated prostate specific antigen (PSA)   . Hematuria, unspecified   . Hypertrophy of prostate without urinary obstruction and other lower urinary tract symptoms (LUTS)   . Impotence of organic origin   . Left knee pain 05/28/2014  . Macrocytosis   . Other and unspecified hyperlipidemia   . Other malaise and fatigue   . Spermatocele    bilateral  . Type II or unspecified type diabetes mellitus without mention of complication, uncontrolled   . Unspecified essential hypertension   . Unspecified glaucoma(365.9)     Past Surgical History:  Procedure Laterality Date  . CATARACT EXTRACTION EXTRACAPSULAR  2008   bilateraly Dr.  Katy Fitch  . COLONOSCOPY  01/03/2002   normal Dr. Earlean Shawl  . PROSTATE BIOPSY  1995   due to elevated PSA normal  . TONSILLECTOMY      There were no vitals filed for this visit.   Subjective Assessment - 03/25/21 1315    Subjective Jimmy notes progress with his L leg symptoms with strengthening and using the walker for longer walks.  No change in L UE symptoms.    Patient is accompained by: Family member    Pertinent History No previous surgeries    Limitations House hold activities;Lifting;Standing;Walking    How long can you sit comfortably? As long as he likes    How long can you stand comfortably? A few minutes (was < 1 minute)    How long can you walk comfortably? A few minutes (was < 1 minute, can go longer but radicular symptoms are present)    Diagnostic tests X-Ray cervical and lumbar (DDD/DJD)    Patient Stated Goals Have less arm and leg pain and paresthesias    Currently in Pain? No/denies    Pain Score 0-No pain    Pain Location Leg    Pain Orientation Left    Pain Descriptors / Indicators Pins and needles    Pain Type Chronic pain    Pain Radiating Towards To foot    Pain Onset More than a month ago  Pain Frequency Occasional    Aggravating Factors  Prolonged WB    Pain Relieving Factors Exercises or use a walker to decrease weight-bearing    Effect of Pain on Daily Activities Has to use a walker with longer walks to reduce L LE symptoms    Multiple Pain Sites Yes    Pain Score 5    Pain Location Arm    Pain Orientation Left    Pain Descriptors / Indicators Sharp;Tingling    Pain Type Chronic pain    Pain Radiating Towards L hand (mostly digits 2 and 3)    Pain Onset More than a month ago    Pain Frequency Constant    Aggravating Factors  Cervical extension, L lateral bending, slouching and supine postures    Pain Relieving Factors Postural correction (scapular retraction, level chin and slight R lateral bend reduces symptoms, does not eliminate them)    Effect of  Pain on Daily Activities Postural correction              [facility] PT Assessment - 03/25/21 0001      Observation/Other Assessments   Focus on Therapeutic Outcomes (FOTO)  96 (was 46, Goal 57)      ROM / Strength   AROM / PROM / Strength AROM      AROM   Overall AROM  Deficits    AROM Assessment Site Cervical;Lumbar    Cervical Extension 30    Cervical - Right Side Bend 20    Cervical - Left Side Bend 0    Cervical - Right Rotation 30    Cervical - Left Rotation 55    Lumbar Extension 5                         [facility] Adult PT Treatment/Exercise - 03/25/21 0001      Therapeutic Activites    Therapeutic Activities Other Therapeutic Activities    Other Therapeutic Activities Reviewed spine anatomy with emphasis on avoiding exacerbating positions and education on getting in relieving positions.  Reviewed RA and recommended follow-up with orthopedic specialist for other options.      Exercises   Exercises Lumbar;Neck      Neck Exercises: Seated   Cervical Isometrics Extension;5 secs;10 reps    Lateral Flexion Right;5 reps;Limitations    Lateral Flexion Limitations 5 seconds to get relief of L UE symptoms    Other Seated Exercise Shoulder blade pinches 10X 5 seconds      Lumbar Exercises: Standing   Other Standing Lumbar Exercises Alternating hip hike 2 sets of 10  3 second hold    Other Standing Lumbar Exercises Trunk extension AROM limited range 10X 3 seconds 9clinic only)      Lumbar Exercises: Seated   Sit to Stand 10 reps;Other (comment)    Sit to Stand Limitations Slow eccentrics and exaggerate good posture at the top                  PT Education - 03/25/21 1400    Education Details Reviewed spine anatomy with the express purpose of increasing awareness of exacerbating and relieving postures for symptoms management.  Reviewed RA findings and long-term HEP.  Recommended follow-up with orthopedic specialist to consider other options like  medications or injections.    Person(s) Educated Patient;Spouse    Methods Explanation;Demonstration;Verbal cues;Tactile cues;Handout    Comprehension Verbal cues required;Returned demonstration;Verbalized understanding;Tactile cues required  PT Short Term Goals - 03/25/21 1401      PT SHORT TERM GOAL #1   Title Chanetta Marshall will report a decrease in upper and lower extremity radicular symptoms when standing and walking for < 5 minutes.    Baseline < 1 minute at evaluation, standing has improved with a walker    Time 4    Period Weeks    Status Achieved    Target Date 03/27/21             PT Long Term Goals - 03/25/21 1403      PT LONG TERM GOAL #1   Title Improve FOTO to 57.    Baseline 46 at eval, 96 today    Time 8    Period Weeks    Status Achieved      PT LONG TERM GOAL #2   Title Chanetta Marshall will be able to stand and walk for 10+ minutes without an increase in radicular symptoms at DC.    Baseline More comfortable with increased strength and a walker, was < 1 minute at DC    Time 8    Period Weeks    Status Partially Met      PT LONG TERM GOAL #3   Title Improve cervical AROM for extension to 40 degrees and rotation to 45 degrees.    Baseline 0 and < 40 respectively at evaluation, improved today (see objective)    Time 8    Period Weeks    Status On-going      PT LONG TERM GOAL #4   Title Improve trunk extension to 10 degrees without increased radicular L leg symptoms.    Baseline -5 degrees at evaluation, 10 degrees with some increase today    Time 8    Period Weeks    Status Partially Met      PT LONG TERM GOAL #5   Title Chanetta Marshall will be independent and compliant with his HEP.    Time 8    Period Weeks    Status Achieved                 Plan - 03/25/21 1406    Clinical Impression Statement Chanetta Marshall has made progress with his L sided LE symptom management since starting physical therapy.  L UE symptoms are unchanged.  I recommended Jimmy follow-up  with his orthopedic specialist to ask about the possibility of medications or injections.  He is able to better manage L LE symtoms with his physical therapy exercises and postural correction.  L UE symptoms can be temporarily reduced and he can find a comfortable position to sleep.  Otherwise, L UE symptoms are constant.  Jimmy had his HEP updated and he is welcome to return if symptoms worsen.    Personal Factors and Comorbidities Age;Fitness    Examination-Activity Limitations Transfers;Bend;Lift;Squat;Geophysical data processor for [employer];Reach Overhead;Stand    Examination-Participation Restrictions Interpersonal Relationship;Community Activity    Stability/Clinical Decision Making Stable/Uncomplicated    Rehab Potential Good    PT Frequency Other (comment)   2-3X/week   PT Duration 8 weeks    PT Treatment/Interventions ADLs/Self Care Home Management;Moist Heat;Cryotherapy;Traction;Therapeutic activities;Gait training;Therapeutic exercise;Neuromuscular re-education;Patient/family education;Manual techniques;Dry needling    PT Next Visit Plan DC    PT Home Exercise Plan Access Code: [email or web address]    Consulted and Agree with Plan of Care Family member/caregiver;Patient    Family Member Consulted Wife           Patient will benefit from skilled therapeutic intervention in  order to improve the following deficits and impairments:  Abnormal gait,Decreased activity tolerance,Decreased endurance,Decreased range of motion,Decreased mobility,Decreased strength,Difficulty walking,Hypomobility,Impaired flexibility,Impaired UE functional use,Postural dysfunction,Improper body mechanics,Pain  Visit Diagnosis: Abnormal posture  Difficulty walking  Radiculopathy, cervical region  Radiculopathy, lumbar region     Problem List Patient Active Problem List   Diagnosis Date Noted  . Memory loss 11/09/2017  . Paresthesia 10/12/2016  . Macrocytosis 10/12/2016  . Light-headed feeling 09/21/2016  .  Right knee pain 07/01/2015  . Left knee pain 05/28/2014  . Deaf 01/22/2014  . DM type 2 (diabetes mellitus, type 2) ([place])   . Hyperlipidemia   . Benign paroxysmal positional vertigo   . Essential hypertension   . BPH (benign prostatic hyperplasia)   . Impotence of organic origin     Farley Ly PT, MPT 03/25/2021, 2:10 PM  [facility] [address] [place], [place], [address] Phone: [phone]   Fax:  [phone]  Name: TAYVEN RENTERIA MRN: [id number] Date of Birth: Feb 19, 1924

## 2021-03-27 ENCOUNTER — Encounter: Payer: Medicare Other | Admitting: Rehabilitative and Restorative Service Providers"

## 2021-03-30 ENCOUNTER — Encounter (INDEPENDENT_AMBULATORY_CARE_PROVIDER_SITE_OTHER): Payer: Self-pay | Admitting: Ophthalmology

## 2021-03-30 ENCOUNTER — Other Ambulatory Visit: Payer: Self-pay

## 2021-03-30 ENCOUNTER — Ambulatory Visit (INDEPENDENT_AMBULATORY_CARE_PROVIDER_SITE_OTHER): Payer: Medicare Other | Admitting: Ophthalmology

## 2021-03-30 DIAGNOSIS — H353132 Nonexudative age-related macular degeneration, bilateral, intermediate dry stage: Secondary | ICD-10-CM

## 2021-03-30 DIAGNOSIS — E1140 Type 2 diabetes mellitus with diabetic neuropathy, unspecified: Secondary | ICD-10-CM | POA: Diagnosis not present

## 2021-03-30 DIAGNOSIS — H3554 Dystrophies primarily involving the retinal pigment epithelium: Secondary | ICD-10-CM

## 2021-03-30 NOTE — Progress Notes (Addendum)
03/30/2021     CHIEF COMPLAINT Patient presents for Retina Evaluation (NP- here for retinal eval referred by R Groat. Pt has possible Vitelliform Dystrophy worsening OU. /Pt denies any visual symptoms at all.//A1C: 7. "Something"/LBS: Does not check)   HISTORY OF PRESENT ILLNESS: George [name] is a 87 y.o. male who presents to the clinic today for:   HPI    Retina Evaluation    In both eyes.  Treatments tried include no treatments. Additional comments: NP- here for retinal eval referred by R Groat. Pt has possible Vitelliform Dystrophy worsening OU.  Pt denies any visual symptoms at all.  A1C: 7. "Something" LBS: Does not check       Last edited by Hurman Horn, MD on 03/30/2021  4:03 PM. (History)      Referring physician: Virgie Dad, MD [place],  [place] [id number]  HISTORICAL INFORMATION:   Selected notes from the MEDICAL RECORD NUMBER    Lab Results  Component Value Date   HGBA1C 7.0 (H) 01/29/2021     CURRENT MEDICATIONS: No current outpatient medications on file. (Ophthalmic Drugs)   No current facility-administered medications for this visit. (Ophthalmic Drugs)   Current Outpatient Medications (Other)  Medication Sig  . aspirin 81 MG tablet Take 81 mg by mouth daily.  . diclofenac (VOLTAREN) 75 MG EC tablet Take 1 tablet (75 mg total) by mouth 2 (two) times daily as needed. Do not take until after you have finished the prednisone  . glucose blood test strip Use one strip to test blood sugar once a day before breakfast . Dx E11.9  . MICROLET LANCETS MISC Use one lancet each morning to test blood sugar. Dx. E11.9  . pioglitazone (ACTOS) 30 MG tablet TAKE 1 TABLET BY MOUTH EVERY DAY  . predniSONE (STERAPRED UNI-PAK 21 TAB) 5 MG (21) TBPK tablet Take as directed  . sitaGLIPtin (JANUVIA) 100 MG tablet TAKE ONE TABLET BY MOUTH ONCE EVERY MORNING TO CONTROL DIABETES   No current facility-administered medications for this visit. (Other)       REVIEW OF SYSTEMS:    ALLERGIES Allergies  Allergen Reactions  . Metformin And Related Other (See Comments)    unknown  . Sulfa Antibiotics Swelling  . Tetanus Toxoids Other (See Comments)    unknown  . Typhoid Vaccines Other (See Comments)    unknown    PAST MEDICAL HISTORY Past Medical History:  Diagnosis Date  . Abdominal pain, other specified site   . Allergic rhinitis due to pollen   . Benign paroxysmal positional vertigo   . Disturbance of skin sensation    left great toe  . Elevated prostate specific antigen (PSA)   . Hematuria, unspecified   . Hypertrophy of prostate without urinary obstruction and other lower urinary tract symptoms (LUTS)   . Impotence of organic origin   . Left knee pain 05/28/2014  . Macrocytosis   . Other and unspecified hyperlipidemia   . Other malaise and fatigue   . Spermatocele    bilateral  . Type II or unspecified type diabetes mellitus without mention of complication, uncontrolled   . Unspecified essential hypertension   . Unspecified glaucoma(365.9)    Past Surgical History:  Procedure Laterality Date  . CATARACT EXTRACTION EXTRACAPSULAR  2008   bilateraly Dr. Katy Fitch  . COLONOSCOPY  01/03/2002   normal Dr. Earlean Shawl  . PROSTATE BIOPSY  1995   due to elevated PSA normal  . TONSILLECTOMY  FAMILY HISTORY Family History  Problem Relation Age of Onset  . Cancer Father        lung  . Cancer Brother        adrenal gland  . Cancer Son        liver    SOCIAL HISTORY Social History   Tobacco Use  . Smoking status: Former Smoker    Years: 4.00    Quit date: 04/09/1948    Years since quitting: 73.0  . Smokeless tobacco: Never Used  Vaping Use  . Vaping Use: Never used  Substance Use Topics  . Alcohol use: No  . Drug use: No         OPHTHALMIC EXAM:  Base Eye Exam    Visual Acuity (ETDRS)      Right Left   Dist [place] 20/30 +2 20/25 -2   Dist ph [place] NI    Correction: Glasses       Tonometry (Tonopen,  3:40 PM)      Right Left   Pressure 15 12       Pupils      Pupils Dark Light Shape React APD   Right PERRL 3 3 Round Minimal None   Left PERRL 3 3 Round Minimal None       Visual Fields (Counting fingers)      Left Right    Full Full       Extraocular Movement      Right Left    Full Full       Neuro/Psych    Oriented x3: Yes       Dilation    Both eyes: 1.0% Mydriacyl, 2.5% Phenylephrine @ 3:41 PM        Slit Lamp and Fundus Exam    External Exam      Right Left   External Normal Normal       Slit Lamp Exam      Right Left   Lids/Lashes Normal Normal   Conjunctiva/Sclera White and quiet White and quiet   Cornea Clear Clear   Anterior Chamber Deep and quiet Deep and quiet   Iris Round and reactive Round and reactive   Lens Centered posterior chamber intraocular lens Centered posterior chamber intraocular lens   Anterior Vitreous Normal Normal       Fundus Exam      Right Left   Posterior Vitreous Central vitreous floaters Central vitreous floaters   Disc Normal Normal   C/D Ratio 0.75 0.75   Macula Subfoveal drusenoid deposit, without widespread RPE change or drusen Subfoveal drusenoid deposit, without widespread RPE change or drusen   Vessels Normal,,, no DR Normal, no DR   Periphery Normal Normal          IMAGING AND PROCEDURES  Imaging and Procedures for 03/30/21  OCT, Retina - OU - Both Eyes       Right Eye Quality was good. Scan locations included subfoveal. Central Foveal Thickness: 338. Progression has no prior data. Findings include subretinal hyper-reflective material, no IRF, no SRF.   Left Eye Quality was good. Scan locations included subfoveal. Central Foveal Thickness: 299. Progression has no prior data. Findings include subretinal hyper-reflective material, no IRF, no SRF.   Notes Bilateral subfoveal drusenoid large deposits in the pigment epithelial detachment configuration with no signs of complicating Intraretinal or  subretinal fluid thus no signs of CNVM formation    intact outer retinal layers compatible  with good acuity  ASSESSMENT/PLAN:  Adult onset vitelliform macular dystrophy The nature of age--related macular degeneration was discussed with the patient as well as the distinction between dry and wet types. Checking an Amsler Grid daily with advice to return immediately should a distortion develop, was given to the patient. The patient 's smoking status now and in the past was determined and advice based on the AREDS study was provided regarding the consumption of antioxidant supplements. AREDS 2 vitamin formulation was recommended. Consumption of dark leafy vegetables and fresh fruits of various colors was recommended. Treatment modalities for wet macular degeneration particularly the use of intravitreal injections of anti-blood vessel growth factors was discussed with the patient. Avastin, Lucentis, and Eylea are the available options. On occasion, therapy includes the use of photodynamic therapy and thermal laser. Stressed to the patient do not rub eyes.  Patient was advised to check Amsler Grid daily and return immediately if changes are noted. Instructions on using the grid were given to the patient. All patient questions were answered.  I have explained this form of macular dystrophy is of unknown origin in onset yet they can progress to similar complications of dry ARMD including CNVM formation.  In the this practices experience is more typical that over time RPE central atrophy will develop which could account for acuity changes in the future or stabilization of acuity.  He will be asked to begin vitamin supplementations with AREDS 2 formulation.  I have also seen this in patients who have undiagnosed and untreated sleep apnea however he does not have a positive review of systems for such  And thus there is no reason to launch into evaluation of sleep apnea  DM type 2  (diabetes mellitus, type 2) No detectable diabetic retinopathy today on dilated funduscopic exam      ICD-10-CM   1. Intermediate stage nonexudative age-related macular degeneration of both eyes  H35.3132 OCT, Retina - OU - Both Eyes  2. Adult onset vitelliform macular dystrophy  H35.54   3. Type 2 diabetes mellitus with diabetic neuropathy, without long-term current use of insulin (HCC)  E11.40     1.  We will instruct the patient to monitor his vision on a daily basis looking for new onset signs of vision loss or visual distortion developing in either eye and promptly report this for consideration evaluation of possible complications of this form of of the current dry macular degeneration  2.  We will also instruct him in the use of proper vitamin supplementation as a potential benefit to slow progression of this aging disease of the central retina  3.  Ophthalmic Meds Ordered this visit:  No orders of the defined types were placed in this encounter.      Return in about 6 months (around 09/29/2021) for DILATE OU, OCT.  Patient Instructions  Age-Related Macular Degeneration  Age-related macular degeneration (AMD) is an eye disease related to aging. The disease causes a loss of central vision. Central vision allows a person to see objects clearly and do daily tasks like reading and driving. There are two main types of AMD:  Dry AMD. People with this type generally lose their vision slowly. This is the most common type of AMD. Some people with dry AMD notice very little change in their vision as they age.  Wet AMD. People with this type can lose their vision quickly. What are the causes? This condition is caused by damage to the part of the eye that provides you with central vision (macula).  Dry AMD happens when deposits in the macula cause light-sensitive cells to slowly break down.  Wet AMD happens when abnormal blood vessels grow under the macula and leak blood and  fluid. What increases the risk? You are more likely to develop this condition if you:  Are 40 years old or older, and especially 2 years old or older.  Smoke.  Are obese.  Have a family history of AMD.  Have high cholesterol, high blood pressure, or heart disease.  Have been exposed to high levels of ultraviolet (UV) light and blue light.  Are white (Caucasian).  Are male. What are the signs or symptoms? Common symptoms of this condition include:  Blurred vision, especially when reading print material. The blurred vision often improves in brighter light.  A blurred or blind spot in the center of your field of vision that is small but growing larger.  Bright colors seeming less bright than they used to be.  Decreased ability to recognize and see faces.  One eye seeing worse than the other.  Decreased ability to adapt to dimly lit rooms.  Straight lines appearing crooked or wavy. How is this diagnosed? This condition is diagnosed based on your symptoms and an eye exam. During the eye exam:  Eye drops will be placed into your eyes to enlarge (dilate) your pupils. This will allow your health care provider to see the back of your eye.  You may be asked to look at an image that looks like a checkerboard (Amsler grid). Early changes in your central vision may cause the grid to appear distorted. After the exam, you may be given one or both of these tests:  Fluorescein angiogram. This test determines whether you have dry or wet AMD.  Optical coherence tomography (OCT) test to evaluate deep layers of the retina. How is this treated? There is no cure for this condition, but treatment can help to slow down progression of the disease. This condition may be treated with:  Supplements, including vitamin C, vitamin E, beta carotene, and zinc.  Laser surgery to destroy new blood vessels or leaking blood vessels in your eye.  Injections of medicines into your eye to slow down  the formation of abnormal blood vessels that may leak. These injections may need to be repeated on a routine basis. Follow these instructions at home:  Take over-the-counter and prescription medicines only as told by your health care provider.  Take vitamins and supplements as told by your health care provider.  Ask your health care provider for an Amsler grid. Use it every day to check each eye for vision changes.  Get an eye exam as often as told by your health care provider. Make sure to get an eye exam at least once every year.  Keep all follow-up visits as told by your health care provider. This is important. Contact a health care provider if:  You notice any new changes in your vision. Get help right away if:  You suddenly lose vision or develop pain in the eye. Summary  Age-related macular degeneration (AMD) is an eye disease related to aging. There are two types of this condition: dry AMD and wet AMD.  This condition is caused by damage to the part of the eye that provides you with central vision (macula).  Once diagnosed with AMD, make sure to get an eye exam every year, take supplements and vitamins as directed, use an Amsler grid at home, and follow up with your health care provider.  This information is not intended to replace advice given to you by your health care provider. Make sure you discuss any questions you have with your health care provider. Document Revised: 06/14/2018 Document Reviewed: 06/14/2018 Elsevier Patient Education  2021 [place] the diagnoses, plan, and follow up with the patient and they expressed understanding.  Patient expressed understanding of the importance of proper follow up care.   Clent Demark Pearlena Ow M.D. Diseases & Surgery of the Retina and Vitreous [facility] [place] 03/30/21     Abbreviations: M myopia (nearsighted); A astigmatism; H hyperopia (farsighted); P presbyopia; Mrx spectacle prescription;  CTL  contact lenses; OD right eye; OS left eye; OU both eyes  XT exotropia; ET esotropia; PEK punctate epithelial keratitis; PEE punctate epithelial erosions; DES dry eye syndrome; MGD meibomian gland dysfunction; ATs artificial tears; PFAT's preservative free artificial tears; [place] nuclear sclerotic cataract; PSC posterior subcapsular cataract; ERM epi-retinal membrane; PVD posterior vitreous detachment; RD retinal detachment; DM diabetes mellitus; DR diabetic retinopathy; NPDR non-proliferative diabetic retinopathy; PDR proliferative diabetic retinopathy; CSME clinically significant macular edema; DME diabetic macular edema; dbh dot blot hemorrhages; CWS cotton wool spot; POAG primary open angle glaucoma; C/D cup-to-disc ratio; HVF humphrey visual field; GVF goldmann visual field; OCT optical coherence tomography; IOP intraocular pressure; BRVO Branch retinal vein occlusion; CRVO central retinal vein occlusion; CRAO central retinal artery occlusion; BRAO branch retinal artery occlusion; RT retinal tear; SB scleral buckle; PPV pars plana vitrectomy; VH Vitreous hemorrhage; PRP panretinal laser photocoagulation; IVK intravitreal kenalog; VMT vitreomacular traction; MH Macular hole;  NVD neovascularization of the disc; NVE neovascularization elsewhere; AREDS age related eye disease study; ARMD age related macular degeneration; POAG primary open angle glaucoma; EBMD epithelial/anterior basement membrane dystrophy; ACIOL anterior chamber intraocular lens; IOL intraocular lens; PCIOL posterior chamber intraocular lens; Phaco/IOL phacoemulsification with intraocular lens placement; [place] photorefractive keratectomy; LASIK laser assisted in situ keratomileusis; HTN hypertension; DM diabetes mellitus; COPD chronic obstructive pulmonary disease

## 2021-03-30 NOTE — Patient Instructions (Signed)
Age-Related Macular Degeneration  Age-related macular degeneration (AMD) is an eye disease related to aging. The disease causes a loss of central vision. Central vision allows a person to see objects clearly and do daily tasks like reading and driving. There are two main types of AMD:  Dry AMD. People with this type generally lose their vision slowly. This is the most common type of AMD. Some people with dry AMD notice very little change in their vision as they age.  Wet AMD. People with this type can lose their vision quickly. What are the causes? This condition is caused by damage to the part of the eye that provides you with central vision (macula).  Dry AMD happens when deposits in the macula cause light-sensitive cells to slowly break down.  Wet AMD happens when abnormal blood vessels grow under the macula and leak blood and fluid. What increases the risk? You are more likely to develop this condition if you:  Are 50 years old or older, and especially 75 years old or older.  Smoke.  Are obese.  Have a family history of AMD.  Have high cholesterol, high blood pressure, or heart disease.  Have been exposed to high levels of ultraviolet (UV) light and blue light.  Are white (Caucasian).  Are male. What are the signs or symptoms? Common symptoms of this condition include:  Blurred vision, especially when reading print material. The blurred vision often improves in brighter light.  A blurred or blind spot in the center of your field of vision that is small but growing larger.  Bright colors seeming less bright than they used to be.  Decreased ability to recognize and see faces.  One eye seeing worse than the other.  Decreased ability to adapt to dimly lit rooms.  Straight lines appearing crooked or wavy. How is this diagnosed? This condition is diagnosed based on your symptoms and an eye exam. During the eye exam:  Eye drops will be placed into your eyes to  enlarge (dilate) your pupils. This will allow your health care provider to see the back of your eye.  You may be asked to look at an image that looks like a checkerboard (Amsler grid). Early changes in your central vision may cause the grid to appear distorted. After the exam, you may be given one or both of these tests:  Fluorescein angiogram. This test determines whether you have dry or wet AMD.  Optical coherence tomography (OCT) test to evaluate deep layers of the retina. How is this treated? There is no cure for this condition, but treatment can help to slow down progression of the disease. This condition may be treated with:  Supplements, including vitamin C, vitamin E, beta carotene, and zinc.  Laser surgery to destroy new blood vessels or leaking blood vessels in your eye.  Injections of medicines into your eye to slow down the formation of abnormal blood vessels that may leak. These injections may need to be repeated on a routine basis. Follow these instructions at home:  Take over-the-counter and prescription medicines only as told by your health care provider.  Take vitamins and supplements as told by your health care provider.  Ask your health care provider for an Amsler grid. Use it every day to check each eye for vision changes.  Get an eye exam as often as told by your health care provider. Make sure to get an eye exam at least once every year.  Keep all follow-up visits as told by   your health care provider. This is important. Contact a health care provider if:  You notice any new changes in your vision. Get help right away if:  You suddenly lose vision or develop pain in the eye. Summary  Age-related macular degeneration (AMD) is an eye disease related to aging. There are two types of this condition: dry AMD and wet AMD.  This condition is caused by damage to the part of the eye that provides you with central vision (macula).  Once diagnosed with AMD, make sure  to get an eye exam every year, take supplements and vitamins as directed, use an Amsler grid at home, and follow up with your health care provider. This information is not intended to replace advice given to you by your health care provider. Make sure you discuss any questions you have with your health care provider. Document Revised: 06/14/2018 Document Reviewed: 06/14/2018 Elsevier Patient Education  [place].

## 2021-03-30 NOTE — Assessment & Plan Note (Signed)
The nature of age--related macular degeneration was discussed with the patient as well as the distinction between dry and wet types. Checking an Amsler Grid daily with advice to return immediately should a distortion develop, was given to the patient. The patient 's smoking status now and in the past was determined and advice based on the AREDS study was provided regarding the consumption of antioxidant supplements. AREDS 2 vitamin formulation was recommended. Consumption of dark leafy vegetables and fresh fruits of various colors was recommended. Treatment modalities for wet macular degeneration particularly the use of intravitreal injections of anti-blood vessel growth factors was discussed with the patient. Avastin, Lucentis, and Eylea are the available options. On occasion, therapy includes the use of photodynamic therapy and thermal laser. Stressed to the patient do not rub eyes.  Patient was advised to check Amsler Grid daily and return immediately if changes are noted. Instructions on using the grid were given to the patient. All patient questions were answered.  I have explained this form of macular dystrophy is of unknown origin in onset yet they can progress to similar complications of dry ARMD including CNVM formation.  In the this practices experience is more typical that over time RPE central atrophy will develop which could account for acuity changes in the future or stabilization of acuity.  He will be asked to begin vitamin supplementations with AREDS 2 formulation.  I have also seen this in patients who have undiagnosed and untreated sleep apnea however he does not have a positive review of systems for such  And thus there is no reason to launch into evaluation of sleep apnea

## 2021-03-30 NOTE — Assessment & Plan Note (Signed)
No detectable diabetic retinopathy today on dilated funduscopic exam

## 2021-03-31 ENCOUNTER — Ambulatory Visit (INDEPENDENT_AMBULATORY_CARE_PROVIDER_SITE_OTHER): Payer: Medicare Other | Admitting: Orthopaedic Surgery

## 2021-03-31 DIAGNOSIS — G5602 Carpal tunnel syndrome, left upper limb: Secondary | ICD-10-CM

## 2021-03-31 DIAGNOSIS — M5412 Radiculopathy, cervical region: Secondary | ICD-10-CM | POA: Diagnosis not present

## 2021-03-31 NOTE — Progress Notes (Signed)
Office Visit Note   Patient: George [name]           Date of Birth: 12/28/1923           MRN: [id number] Visit Date: 03/31/2021              Requested by: Virgie Dad, MD [id number] [place],  [place] [id number] PCP: Virgie Dad, MD   Assessment & Plan: Visit Diagnoses:  1. Radiculopathy of cervical spine     Plan: Impression is cervical radiculopathy and possible carpal tunnel syndrome.  At this point we will order an MRI of the C-spine as well as EMGs to evaluate for left carpal tunnel syndrome given failure to provide relief from medications.  Follow-up after the studies have been completed.  Follow-Up Instructions: Return if symptoms worsen or fail to improve.   Orders:  No orders of the defined types were placed in this encounter.  No orders of the defined types were placed in this encounter.     Procedures: No procedures performed   Clinical Data: No additional findings.   Subjective: Chief Complaint  Patient presents with  . Left Shoulder - Pain    George [name] returns today for continued left hand numbness and tingling mainly in the index finger and long fingers.  He also has a radiating neck pain as well.  The medications we prescribed did not help.  He is still able to sleep quite well.   Review of Systems  Constitutional: Negative.   All other systems reviewed and are negative.    Objective: Vital Signs: There were no vitals taken for this visit.  Physical Exam Vitals and nursing note reviewed.  Constitutional:      Appearance: He is well-developed.  Pulmonary:     Effort: Pulmonary effort is normal.  Abdominal:     Palpations: Abdomen is soft.  Skin:    General: Skin is warm.  Neurological:     Mental Status: He is alert and oriented to person, place, and time.  Psychiatric:        Behavior: Behavior normal.        Thought Content: Thought content normal.        Judgment: Judgment normal.     Ortho Exam Left hand shows  hyperextension at the MP joint of the thumb.  Negative carpal tunnel compressive signs.  Mild muscle atrophy.  He has subjective numbness in the index and long fingertips.  Extension of the C-spine causes radiating pain down his left arm. Specialty Comments:  No specialty comments available.  Imaging: OCT, Retina - OU - Both Eyes  Result Date: 03/30/2021 Right Eye Quality was good. Scan locations included subfoveal. Central Foveal Thickness: 338. Progression has no prior data. Findings include subretinal hyper-reflective material, no IRF, no SRF. Left Eye Quality was good. Scan locations included subfoveal. Central Foveal Thickness: 299. Progression has no prior data. Findings include subretinal hyper-reflective material, no IRF, no SRF. Notes Bilateral subfoveal drusenoid large deposits in the pigment epithelial detachment configuration with no signs of complicating Intraretinal or subretinal fluid thus no signs of CNVM formation  intact outer retinal layers compatible  with good acuity    PMFS History: Patient Active Problem List   Diagnosis Date Noted  . Adult onset vitelliform macular dystrophy 03/30/2021  . Memory loss 11/09/2017  . Paresthesia 10/12/2016  . Macrocytosis 10/12/2016  . Light-headed feeling 09/21/2016  . Right knee pain 07/01/2015  . Left knee pain 05/28/2014  .  Deaf 01/22/2014  . DM type 2 (diabetes mellitus, type 2) ([place])   . Hyperlipidemia   . Benign paroxysmal positional vertigo   . Essential hypertension   . BPH (benign prostatic hyperplasia)   . Impotence of organic origin    Past Medical History:  Diagnosis Date  . Abdominal pain, other specified site   . Allergic rhinitis due to pollen   . Benign paroxysmal positional vertigo   . Disturbance of skin sensation    left great toe  . Elevated prostate specific antigen (PSA)   . Hematuria, unspecified   . Hypertrophy of prostate without urinary obstruction and other lower urinary tract symptoms (LUTS)   .  Impotence of organic origin   . Left knee pain 05/28/2014  . Macrocytosis   . Other and unspecified hyperlipidemia   . Other malaise and fatigue   . Spermatocele    bilateral  . Type II or unspecified type diabetes mellitus without mention of complication, uncontrolled   . Unspecified essential hypertension   . Unspecified glaucoma(365.9)     Family History  Problem Relation Age of Onset  . Cancer Father        lung  . Cancer Brother        adrenal gland  . Cancer Son        liver    Past Surgical History:  Procedure Laterality Date  . CATARACT EXTRACTION EXTRACAPSULAR  2008   bilateraly Dr. Katy Fitch  . COLONOSCOPY  01/03/2002   normal Dr. Earlean Shawl  . PROSTATE BIOPSY  1995   due to elevated PSA normal  . TONSILLECTOMY     Social History   Occupational History  . Occupation: retired, self employed  Tobacco Use  . Smoking status: Former Smoker    Years: 4.00    Quit date: 04/09/1948    Years since quitting: 73.0  . Smokeless tobacco: Never Used  Vaping Use  . Vaping Use: Never used  Substance and Sexual Activity  . Alcohol use: No  . Drug use: No  . Sexual activity: Yes

## 2021-04-01 NOTE — Addendum Note (Signed)
Addended by: Precious Bard on: 04/01/2021 02:29 PM   Modules accepted: Orders

## 2021-04-10 DIAGNOSIS — Z20822 Contact with and (suspected) exposure to covid-19: Secondary | ICD-10-CM | POA: Diagnosis not present

## 2021-04-23 ENCOUNTER — Other Ambulatory Visit: Payer: Self-pay

## 2021-04-23 ENCOUNTER — Ambulatory Visit
Admission: RE | Admit: 2021-04-23 | Discharge: 2021-04-23 | Disposition: A | Discharge status: Home / Self Care | Payer: Medicare Other | Source: Ambulatory Visit | Attending: Orthopaedic Surgery | Admitting: Orthopaedic Surgery

## 2021-04-23 DIAGNOSIS — M5412 Radiculopathy, cervical region: Secondary | ICD-10-CM

## 2021-04-28 ENCOUNTER — Ambulatory Visit (INDEPENDENT_AMBULATORY_CARE_PROVIDER_SITE_OTHER): Payer: Medicare Other | Admitting: Orthopaedic Surgery

## 2021-04-28 ENCOUNTER — Telehealth: Payer: Self-pay

## 2021-04-28 DIAGNOSIS — M5412 Radiculopathy, cervical region: Secondary | ICD-10-CM

## 2021-04-28 DIAGNOSIS — G5602 Carpal tunnel syndrome, left upper limb: Secondary | ICD-10-CM

## 2021-04-28 NOTE — Telephone Encounter (Signed)
Scheduled

## 2021-04-28 NOTE — Progress Notes (Signed)
George [name] returns today for follow-up of his neck and left arm pain.  He was unable to get the EMGs because they were on a cruise and he did not get the phone calls.  He was unable to tolerate the C-spine MRI due to having to lay flat on his back for 20 minutes.  We will reschedule the EMGs now that he is back in town.  In terms of his neck I am not sure what options he has since he is unable to tolerate MRI.  I have recommended referral to Dr. Louanne Skye for further evaluation and treatment on how best to tackle his neck pain.  We can see him back after the EMGs.

## 2021-04-28 NOTE — Telephone Encounter (Signed)
Patient was here for an appt today. He states he was out of town for 2 weeks. He would like a CB to schedule NCS/EMG.

## 2021-05-01 ENCOUNTER — Telehealth: Payer: Self-pay | Admitting: Physical Medicine and Rehabilitation

## 2021-05-01 ENCOUNTER — Encounter: Payer: Medicare Other | Admitting: Physical Medicine and Rehabilitation

## 2021-05-01 NOTE — Telephone Encounter (Signed)
Patient called needing to cancel appt for this morning. Pt states his wife is sick and if he needs appt he will reschedule at a later date. Phone number is [phone].

## 2021-05-12 ENCOUNTER — Ambulatory Visit (INDEPENDENT_AMBULATORY_CARE_PROVIDER_SITE_OTHER): Payer: Medicare Other | Admitting: Physical Medicine and Rehabilitation

## 2021-05-12 ENCOUNTER — Encounter: Payer: Self-pay | Admitting: Physical Medicine and Rehabilitation

## 2021-05-12 ENCOUNTER — Other Ambulatory Visit: Payer: Self-pay

## 2021-05-12 DIAGNOSIS — R202 Paresthesia of skin: Secondary | ICD-10-CM

## 2021-05-12 NOTE — Progress Notes (Signed)
Guss Farruggia Dieudonne - 87 y.o. male MRN [id number]  Date of birth: [birth year]  Office Visit Note: Visit Date: 05/12/2021 PCP: Virgie Dad, MD Referred by: Virgie Dad, MD  Subjective: Chief Complaint  Patient presents with  . Left Hand - Numbness   HPI:  JERYL UMHOLTZ is a 87 y.o. male who comes in today at the request of Dr. Eduard Roux for electrodiagnostic study of the Left upper extremities.  Patient is Right hand dominant.  He reports a several month history of progressive left hand pain with numbness and tingling particularly in the index and middle finger.  He does have significant neck pain with referral pain but it is hard to say if it is a fully radicular pain down the arm to the fingers.  He does feel like the numbness and tingling is constant.  He has no right-sided complaints.  Cervical spine x-ray as would be expected for a 87 year old gentleman shows extensive degenerative change.  He was unable to obtain MRI because he could not lay on his back [place] pain.  He is a diabetic with hemoglobin A1c around 7.  He has not had prior electrodiagnostic study.  He has follow-up with Dr. Basil Dess for consultation in a few weeks.   ROS Otherwise per HPI.  Assessment & Plan: Visit Diagnoses:    ICD-10-CM   1. Paresthesia of skin  R20.2 NCV with EMG (electromyography)    Plan:  Impression: The above electrodiagnostic study is ABNORMAL and reveals evidence of a moderate left median nerve entrapment at the wrist (carpal tunnel syndrome) affecting sensory and motor components.   There is no significant electrodiagnostic evidence of any other focal nerve entrapment, brachial plexopathy or cervical radiculopathy.  As you know, this particular electrodiagnostic study cannot rule out chemical radiculitis or sensory only radiculopathy.  Recommendations: 1.  Follow-up with referring physician. 2.  Continue current management of symptoms.  Meds & Orders: No orders of the defined types  were placed in this encounter.   Orders Placed This Encounter  Procedures  . NCV with EMG (electromyography)    Follow-up: Return in about 2 weeks (around 05/26/2021) for Eduard Roux, MD, also has Basil Dess, MD appointment on 06/05/2021.   Procedures: No procedures performed  EMG & NCV Findings: Evaluation of the left median motor nerve showed prolonged distal onset latency (4.4 ms), reduced amplitude (4.0 mV), and decreased conduction velocity (Elbow-Wrist, 47 m/s).  The left median (across palm) sensory nerve showed prolonged distal peak latency (Wrist, 3.8 ms).  All remaining nerves (as indicated in the following tables) were within normal limits.    All examined muscles (as indicated in the following table) showed no evidence of electrical instability.    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   There is no significant electrodiagnostic evidence of any other focal nerve entrapment, brachial plexopathy or cervical radiculopathy.  As you know, this particular electrodiagnostic study cannot rule out chemical radiculitis or sensory only radiculopathy.  Recommendations: 1.  Follow-up with referring physician. 2.  Continue current management of symptoms.  ___________________________ Laurence Spates FAAPMR Board Certified, American Board of Physical Medicine and Rehabilitation    Nerve Conduction Studies Anti Sensory Summary Table   Stim Site NR Peak (ms) Norm Peak (ms) P-T Amp (V) Norm P-T Amp Site1 Site2 Delta-P (ms) Dist (cm) Vel (m/s) Norm Vel (m/s)  Left Median Acr Palm Anti Sensory (2nd Digit)  31.5C  Wrist    *3.8 <3.6 13.4 >10 Wrist Palm 1.8 0.0    Palm    2.0 <2.0 18.4         Left Radial Anti Sensory (Base 1st Digit)  31.5C  Wrist    2.3 <3.1 24.4  Wrist Base 1st Digit 2.3 0.0    Left Ulnar Anti Sensory (5th Digit)   31.9C  Wrist    3.4 <3.7 16.2 >15.0 Wrist 5th Digit 3.4 14.0 41 >38   Motor Summary Table   Stim Site NR Onset (ms) Norm Onset (ms) O-P Amp (mV) Norm O-P Amp Site1 Site2 Delta-0 (ms) Dist (cm) Vel (m/s) Norm Vel (m/s)  Left Median Motor (Abd Poll Brev)  31.7C  Wrist    *4.4 <4.2 *4.0 >5 Elbow Wrist 4.5 21.0 *47 >50  Elbow    8.9  3.5         Left Ulnar Motor (Abd Dig Min)  31.9C  Wrist    3.3 <4.2 5.9 >3 B Elbow Wrist 3.1 19.0 61 >53  B Elbow    6.4  5.3  A Elbow B Elbow 1.5 10.0 67 >53  A Elbow    7.9  5.2          EMG   Side Muscle Nerve Root Ins Act Fibs Psw Amp Dur Poly Recrt Int Fraser Din Comment  Left Abd Poll Brev Median C8-T1 Nml Nml Nml Nml Nml 0 Nml Nml   Left 1stDorInt Ulnar C8-T1 Nml Nml Nml Nml Nml 0 Nml Nml   Left PronatorTeres Median C6-7 Nml Nml Nml Nml Nml 0 Nml Nml   Left Biceps Musculocut C5-6 Nml Nml Nml Nml Nml 0 Nml Nml   Left Deltoid Axillary C5-6 Nml Nml Nml Nml Nml 0 Nml Nml     Nerve Conduction Studies Anti Sensory Left/Right Comparison   Stim Site L Lat (ms) R Lat (ms) L-R Lat (ms) L Amp (V) R Amp (V) L-R Amp (%) Site1 Site2 L Vel (m/s) R Vel (m/s) L-R Vel (m/s)  Median Acr Palm Anti Sensory (2nd Digit)  31.5C  Wrist *3.8   13.4   Wrist Palm     Palm 2.0   18.4         Radial Anti Sensory (Base 1st Digit)  31.5C  Wrist 2.3   24.4   Wrist Base 1st Digit     Ulnar Anti Sensory (5th Digit)  31.9C  Wrist 3.4   16.2   Wrist 5th Digit 41     Motor Left/Right Comparison   Stim Site L Lat (ms) R Lat (ms) L-R Lat (ms) L Amp (mV) R Amp (mV) L-R Amp (%) Site1 Site2 L Vel (m/s) R Vel (m/s) L-R Vel (m/s)  Median Motor (Abd Poll Brev)  31.7C  Wrist *4.4   *4.0   Elbow Wrist *47    Elbow 8.9   3.5         Ulnar Motor (Abd Dig Min)  31.9C  Wrist 3.3   5.9   B Elbow Wrist 61    B Elbow 6.4   5.3   A Elbow B Elbow 67    A Elbow 7.9   5.2            Waveforms:             Clinical History: No specialty comments available.     Objective:  VS:   HT:  WT:   BMI:     BP:   HR: bpm  TEMP: ( )  RESP:  Physical Exam Musculoskeletal:        General: No swelling, tenderness, deformity or signs of injury.     Comments: Patient sits with forward flexed cervical spine.  He does have some trigger points to palpation in the trapezius and levator scapula.  He has a negative Spurling's test.  Inspection reveals no atrophy of the bilateral APB or FDI or hand intrinsics.  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  There is no swelling, color changes, allodynia or dystrophic changes. There is 5 out of 5 strength in the bilateral wrist extension, finger abduction and long finger flexion. There is intact sensation to light touch in all dermatomal and peripheral nerve distributions.  There is a negative Hoffmann's test bilaterally.  Skin:    General: Skin is warm and dry.     Findings: No erythema or rash.  Neurological:     General: No focal deficit present.     Mental Status: He is alert and oriented to person, place, and time.     Cranial Nerves: No cranial nerve deficit.     Sensory: No sensory deficit.     Motor: No weakness or abnormal muscle tone.     Coordination: Coordination normal.     Gait: Gait normal.  Psychiatric:        Mood and Affect: Mood normal.        Behavior: Behavior normal.        Thought Content: Thought content normal.      Imaging: No results found.

## 2021-05-12 NOTE — Procedures (Signed)
EMG & NCV Findings: Evaluation of the left median motor nerve showed prolonged distal onset latency (4.4 ms), reduced amplitude (4.0 mV), and decreased conduction velocity (Elbow-Wrist, 47 m/s).  The left median (across palm) sensory nerve showed prolonged distal peak latency (Wrist, 3.8 ms).  All remaining nerves (as indicated in the following tables) were within normal limits.    All examined muscles (as indicated in the following table) showed no evidence of electrical instability.    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   There is no significant electrodiagnostic evidence of any other focal nerve entrapment, brachial plexopathy or cervical radiculopathy.  As you know, this particular electrodiagnostic study cannot rule out chemical radiculitis or sensory only radiculopathy.  Recommendations: 1.  Follow-up with referring physician. 2.  Continue current management of symptoms.  ___________________________ Laurence Spates FAAPMR Board Certified, American Board of Physical Medicine and Rehabilitation    Nerve Conduction Studies Anti Sensory Summary Table   Stim Site NR Peak (ms) Norm Peak (ms) P-T Amp (V) Norm P-T Amp Site1 Site2 Delta-P (ms) Dist (cm) Vel (m/s) Norm Vel (m/s)  Left Median Acr Palm Anti Sensory (2nd Digit)  31.5C  Wrist    *3.8 <3.6 13.4 >10 Wrist Palm 1.8 0.0    Palm    2.0 <2.0 18.4         Left Radial Anti Sensory (Base 1st Digit)  31.5C  Wrist    2.3 <3.1 24.4  Wrist Base 1st Digit 2.3 0.0    Left Ulnar Anti Sensory (5th Digit)  31.9C  Wrist    3.4 <3.7 16.2 >15.0 Wrist 5th Digit 3.4 14.0 41 >38   Motor Summary Table   Stim Site NR Onset (ms) Norm Onset (ms) O-P Amp (mV) Norm O-P Amp Site1 Site2 Delta-0 (ms) Dist (cm) Vel (m/s) Norm Vel (m/s)  Left Median Motor (Abd Poll Brev)  31.7C  Wrist    *4.4  <4.2 *4.0 >5 Elbow Wrist 4.5 21.0 *47 >50  Elbow    8.9  3.5         Left Ulnar Motor (Abd Dig Min)  31.9C  Wrist    3.3 <4.2 5.9 >3 B Elbow Wrist 3.1 19.0 61 >53  B Elbow    6.4  5.3  A Elbow B Elbow 1.5 10.0 67 >53  A Elbow    7.9  5.2          EMG   Side Muscle Nerve Root Ins Act Fibs Psw Amp Dur Poly Recrt Int Fraser Din Comment  Left Abd Poll Brev Median C8-T1 Nml Nml Nml Nml Nml 0 Nml Nml   Left 1stDorInt Ulnar C8-T1 Nml Nml Nml Nml Nml 0 Nml Nml   Left PronatorTeres Median C6-7 Nml Nml Nml Nml Nml 0 Nml Nml   Left Biceps Musculocut C5-6 Nml Nml Nml Nml Nml 0 Nml Nml   Left Deltoid Axillary C5-6 Nml Nml Nml Nml Nml 0 Nml Nml     Nerve Conduction Studies Anti Sensory Left/Right Comparison   Stim Site L Lat (ms) R Lat (ms) L-R Lat (ms) L Amp (V) R Amp (V) L-R Amp (%) Site1 Site2 L Vel (m/s) R Vel (m/s) L-R Vel (m/s)  Median Acr Palm Anti Sensory (2nd Digit)  31.5C  Wrist *3.8   13.4   Wrist Palm     Palm 2.0   18.4  Radial Anti Sensory (Base 1st Digit)  31.5C  Wrist 2.3   24.4   Wrist Base 1st Digit     Ulnar Anti Sensory (5th Digit)  31.9C  Wrist 3.4   16.2   Wrist 5th Digit 41     Motor Left/Right Comparison   Stim Site L Lat (ms) R Lat (ms) L-R Lat (ms) L Amp (mV) R Amp (mV) L-R Amp (%) Site1 Site2 L Vel (m/s) R Vel (m/s) L-R Vel (m/s)  Median Motor (Abd Poll Brev)  31.7C  Wrist *4.4   *4.0   Elbow Wrist *47    Elbow 8.9   3.5         Ulnar Motor (Abd Dig Min)  31.9C  Wrist 3.3   5.9   B Elbow Wrist 61    B Elbow 6.4   5.3   A Elbow B Elbow 67    A Elbow 7.9   5.2            Waveforms:

## 2021-05-12 NOTE — Progress Notes (Signed)
Constant numbness in first and second fingers of left hand. Right hand dominant No lotion per patient

## 2021-05-29 DIAGNOSIS — L602 Onychogryphosis: Secondary | ICD-10-CM | POA: Diagnosis not present

## 2021-05-29 DIAGNOSIS — L84 Corns and callosities: Secondary | ICD-10-CM | POA: Diagnosis not present

## 2021-05-29 DIAGNOSIS — E1159 Type 2 diabetes mellitus with other circulatory complications: Secondary | ICD-10-CM | POA: Diagnosis not present

## 2021-06-01 ENCOUNTER — Other Ambulatory Visit: Payer: Self-pay

## 2021-06-01 DIAGNOSIS — E1140 Type 2 diabetes mellitus with diabetic neuropathy, unspecified: Secondary | ICD-10-CM

## 2021-06-01 DIAGNOSIS — E538 Deficiency of other specified B group vitamins: Secondary | ICD-10-CM | POA: Diagnosis not present

## 2021-06-02 LAB — CBC WITH DIFFERENTIAL/PLATELET
Absolute Monocytes: 737 cells/uL (ref 200–950)
Basophils Absolute: 38 cells/uL (ref 0–200)
Basophils Relative: 0.6 %
Eosinophils Absolute: 183 cells/uL (ref 15–500)
Eosinophils Relative: 2.9 %
HCT: 36.6 % — ABNORMAL LOW (ref 38.5–50.0)
Hemoglobin: 12.7 g/dL — ABNORMAL LOW (ref 13.2–17.1)
Lymphs Abs: 1726 cells/uL (ref 850–3900)
MCH: 35.5 pg — ABNORMAL HIGH (ref 27.0–33.0)
MCHC: 34.7 g/dL (ref 32.0–36.0)
MCV: 102.2 fL — ABNORMAL HIGH (ref 80.0–100.0)
MPV: 9.5 fL (ref 7.5–12.5)
Monocytes Relative: 11.7 %
Neutro Abs: 3616 cells/uL (ref 1500–7800)
Neutrophils Relative %: 57.4 %
Platelets: 295 10*3/uL (ref 140–400)
RBC: 3.58 10*6/uL — ABNORMAL LOW (ref 4.20–5.80)
RDW: 17.4 % — ABNORMAL HIGH (ref 11.0–15.0)
Total Lymphocyte: 27.4 %
WBC: 6.3 10*3/uL (ref 3.8–10.8)

## 2021-06-02 LAB — HEMOGLOBIN A1C
Hgb A1c MFr Bld: 6.3 % of total Hgb — ABNORMAL HIGH (ref <5.7)
Mean Plasma Glucose: 134 mg/dL
eAG (mmol/L): 7.4 mmol/L

## 2021-06-02 LAB — TSH: TSH: 2.92 mIU/L (ref 0.40–4.50)

## 2021-06-05 ENCOUNTER — Encounter: Payer: Self-pay | Admitting: Specialist

## 2021-06-05 ENCOUNTER — Other Ambulatory Visit: Payer: Self-pay

## 2021-06-05 ENCOUNTER — Ambulatory Visit: Payer: Self-pay

## 2021-06-05 ENCOUNTER — Ambulatory Visit (INDEPENDENT_AMBULATORY_CARE_PROVIDER_SITE_OTHER): Payer: Medicare Other | Admitting: Specialist

## 2021-06-05 VITALS — BP 115/83 | HR 74 | Ht 65.0 in | Wt 152.0 lb

## 2021-06-05 DIAGNOSIS — M5416 Radiculopathy, lumbar region: Secondary | ICD-10-CM

## 2021-06-05 DIAGNOSIS — M5412 Radiculopathy, cervical region: Secondary | ICD-10-CM

## 2021-06-05 DIAGNOSIS — M48062 Spinal stenosis, lumbar region with neurogenic claudication: Secondary | ICD-10-CM

## 2021-06-05 DIAGNOSIS — G5602 Carpal tunnel syndrome, left upper limb: Secondary | ICD-10-CM

## 2021-06-05 NOTE — Progress Notes (Signed)
Office Visit Note   Patient: George [name]           Date of Birth: 08-24-1924           MRN: [id number] Visit Date: 06/05/2021              Requested by: Virgie Dad, MD [place],  [place] [id number] PCP: Virgie Dad, MD   Assessment & Plan: Visit Diagnoses:  1. Radiculopathy of cervical spine   2. Carpal tunnel syndrome on left   3. Spinal stenosis of lumbar region with neurogenic claudication   4. Lumbar radiculopathy     Plan: Carpal tunnel syndrome is a condition that causes pain in your hand and arm. The carpal tunnel is a narrow area located on the palm side of your wrist. Repeated wrist motion or certain diseases may cause swelling within the tunnel. This swelling pinches the main nerve in the wrist (median nerve). What are the causes? This condition may be caused by: Repeated wrist motions. Wrist injuries. Arthritis. A cyst or tumor in the carpal tunnel. Fluid buildup during pregnancy. Sometimes the cause of this condition is not known. What increases the risk? This condition is more likely to develop in: People who have jobs that cause them to repeatedly move their wrists in the same motion, such as Art gallery manager. Women. People with certain conditions, such as: Diabetes. Obesity. An underactive thyroid (hypothyroidism). Kidney failure. What are the signs or symptoms? Symptoms of this condition include: A tingling feeling in your fingers, especially in your thumb, index, and middle fingers. Tingling or numbness in your hand. An aching feeling in your entire arm, especially when your wrist and elbow are bent for long periods of time. Wrist pain that goes up your arm to your shoulder. Pain that goes down into your palm or fingers. A weak feeling in your hands. You may have trouble grabbing and holding items. Your symptoms may feel worse during the night. How is this diagnosed? This condition is diagnosed with a medical history  and physical exam. You may also have tests, including: An electromyogram (EMG). This test measures electrical signals sent by your nerves into the muscles. X-rays. How is this treated? Treatment for this condition includes: Lifestyle changes. It is important to stop doing or modify the activity that caused your condition. Physical or occupational therapy. Medicines for pain and inflammation. This may include medicine that is injected into your wrist. A wrist splint. Surgery. Follow these instructions at home: If you have a splint:  Wear it as told by your health care provider. Remove it only as told by your health care provider. Loosen the splint if your fingers become numb and tingle, or if they turn cold and blue. Keep the splint clean and dry. General instructions  Take over-the-counter and prescription medicines only as told by your health care provider. Rest your wrist from any activity that may be causing your pain. If your condition is work related, talk to your employer about changes that can be made, such as getting a wrist pad to use while typing. If directed, apply ice to the painful area: Put ice in a plastic bag. Place a towel between your skin and the bag. Leave the ice on for 20 minutes, 2-3 times per day. Keep all follow-up visits as told by your health care provider. This is important. Do any exercises as told by your health care provider, physical therapist, or occupational therapist. Contact  a health care provider if: You have new symptoms. Your pain is not controlled with medicines. Your symptoms get worse. This information is not intended to replace advice given to you by your health care provider. Make sure you discuss any questions you have with your health care provider. Document Released: 12/03/2000 Document Revised: 04/15/2016 Document Reviewed: 08/17/2017 Elsevier Interactive Patient Education  2017 [employer].  Avoid overhead lifting and overhead use of  the arms. Do not lift greater than 5 lbs. Adjust head rest in vehicle to prevent hyperextension if rear ended. Take extra precautions to avoid falling. Avoid bending, stooping and avoid lifting weights greater than 10 lbs. Avoid prolong standing and walking. Avoid frequent bending and stooping  No lifting greater than 10 lbs. May use ice or moist heat for pain. Weight loss is of benefit. Handicap license is approved.    Follow-Up Instructions: No follow-ups on file.   Orders:  Orders Placed This Encounter  Procedures   XR Cervical Spine 2 or 3 views   No orders of the defined types were placed in this encounter.     Procedures: No procedures performed   Clinical Data: No additional findings.   Subjective: Chief Complaint  Patient presents with   Neck - Pain    87 year old right male with history of left hand numbness and tingling, mainly into the left index and long finger. He has no neck pain or night pain. No bowel or bladder difficulty. He has some difficulty with left leg pain with prolong standing and walking.   Review of Systems  Constitutional: Negative.   HENT: Negative.    Eyes: Negative.   Respiratory: Negative.    Cardiovascular: Negative.   Gastrointestinal: Negative.   Endocrine: Negative.   Genitourinary: Negative.   Musculoskeletal: Negative.   Skin: Negative.   Allergic/Immunologic: Negative.   Neurological: Negative.   Hematological: Negative.   Psychiatric/Behavioral: Negative.      Objective: Vital Signs: There were no vitals taken for this visit.  Physical Exam Constitutional:      Appearance: He is well-developed.  HENT:     Head: Normocephalic and atraumatic.  Eyes:     Pupils: Pupils are equal, round, and reactive to light.  Pulmonary:     Effort: Pulmonary effort is normal.     Breath sounds: Normal breath sounds.  Abdominal:     General: Bowel sounds are normal.     Palpations: Abdomen is soft.  Musculoskeletal:      Cervical back: Normal range of motion and neck supple.  Skin:    General: Skin is warm and dry.  Neurological:     Mental Status: He is alert and oriented to person, place, and time.  Psychiatric:        Behavior: Behavior normal.        Thought Content: Thought content normal.        Judgment: Judgment normal.   Back Exam   Tenderness  The patient is experiencing tenderness in the cervical.  Range of Motion  Extension:  abnormal  Flexion:  abnormal  Lateral bend right:  abnormal  Lateral bend left:  abnormal  Rotation right:  abnormal  Rotation left:  abnormal   Other  Erythema: no back redness Scars: absent    Specialty Comments:  No specialty comments available.  Imaging: No results found.   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   Memory loss  11/09/2017   Paresthesia 10/12/2016   Macrocytosis 10/12/2016   Light-headed feeling 09/21/2016   Right knee pain 07/01/2015   Left knee pain 05/28/2014   Deaf 01/22/2014   DM type 2 (diabetes mellitus, type 2) (HCC)    Hyperlipidemia    Benign paroxysmal positional vertigo    Essential hypertension    BPH (benign prostatic hyperplasia)    Impotence of organic origin    Past Medical History:  Diagnosis Date   Abdominal pain, other specified site    Allergic rhinitis due to pollen    Benign paroxysmal positional vertigo    Disturbance of skin sensation    left great toe   Elevated prostate specific antigen (PSA)    Hematuria, unspecified    Hypertrophy of prostate without urinary obstruction and other lower urinary tract symptoms (LUTS)    Impotence of organic origin    Left knee pain 05/28/2014   Macrocytosis    Other and unspecified hyperlipidemia    Other malaise and fatigue    Spermatocele    bilateral   Type II or unspecified type diabetes mellitus without mention of complication, uncontrolled    Unspecified essential  hypertension    Unspecified glaucoma(365.9)     Family History  Problem Relation Age of Onset   Cancer Father        lung   Cancer Brother        adrenal gland   Cancer Son        liver    Past Surgical History:  Procedure Laterality Date   CATARACT EXTRACTION EXTRACAPSULAR  2008   bilateraly Dr. Katy Fitch   COLONOSCOPY  01/03/2002   normal Dr. Earlean Shawl   PROSTATE BIOPSY  1995   due to elevated PSA normal   TONSILLECTOMY     Social History   Occupational History   Occupation: retired, self employed  Tobacco Use   Smoking status: Former    Years: 4.00    Pack years: 0.00    Types: Cigarettes    Quit date: 04/09/1948    Years since quitting: 73.2   Smokeless tobacco: Never  Vaping Use   Vaping Use: Never used  Substance and Sexual Activity   Alcohol use: No   Drug use: No   Sexual activity: Yes

## 2021-06-05 NOTE — Patient Instructions (Addendum)
Carpal Tunnel Syndrome  Carpal tunnel syndrome is a condition that causes pain in your hand and arm. The carpal tunnel is a narrow area located on the palm side of your wrist. Repeated wrist motion or certain diseases may cause swelling within the tunnel. This swelling pinches the main nerve in the wrist (median nerve). What are the causes? This condition may be caused by: Repeated wrist motions. Wrist injuries. Arthritis. A cyst or tumor in the carpal tunnel. Fluid buildup during pregnancy. Sometimes the cause of this condition is not known. What increases the risk? This condition is more likely to develop in: People who have jobs that cause them to repeatedly move their wrists in the same motion, such as Art gallery manager. Women. People with certain conditions, such as: Diabetes. Obesity. An underactive thyroid (hypothyroidism). Kidney failure. What are the signs or symptoms? Symptoms of this condition include: A tingling feeling in your fingers, especially in your thumb, index, and middle fingers. Tingling or numbness in your hand. An aching feeling in your entire arm, especially when your wrist and elbow are bent for long periods of time. Wrist pain that goes up your arm to your shoulder. Pain that goes down into your palm or fingers. A weak feeling in your hands. You may have trouble grabbing and holding items. Your symptoms may feel worse during the night. How is this diagnosed? This condition is diagnosed with a medical history and physical exam. You may also have tests, including: An electromyogram (EMG). This test measures electrical signals sent by your nerves into the muscles. X-rays. How is this treated? Treatment for this condition includes: Lifestyle changes. It is important to stop doing or modify the activity that caused your condition. Physical or occupational therapy. Medicines for pain and inflammation. This may include medicine that is injected into your  wrist. A wrist splint. Surgery. Follow these instructions at home: If you have a splint:  Wear it as told by your health care provider. Remove it only as told by your health care provider. Loosen the splint if your fingers become numb and tingle, or if they turn cold and blue. Keep the splint clean and dry. General instructions  Take over-the-counter and prescription medicines only as told by your health care provider. Rest your wrist from any activity that may be causing your pain. If your condition is work related, talk to your employer about changes that can be made, such as getting a wrist pad to use while typing. If directed, apply ice to the painful area: Put ice in a plastic bag. Place a towel between your skin and the bag. Leave the ice on for 20 minutes, 2-3 times per day. Keep all follow-up visits as told by your health care provider. This is important. Do any exercises as told by your health care provider, physical therapist, or occupational therapist. Contact a health care provider if: You have new symptoms. Your pain is not controlled with medicines. Your symptoms get worse. This information is not intended to replace advice given to you by your health care provider. Make sure you discuss any questions you have with your health care provider. Vitamin B complex tablet can help with nerve healing. Document Released: 12/03/2000 Document Revised: 04/15/2016 Document Reviewed: 08/17/2017 Elsevier Interactive Patient Education  2017 [employer].  Avoid overhead lifting and overhead use of the arms. Do not lift greater than 5 lbs. Adjust head rest in vehicle to prevent hyperextension if rear ended. Take extra precautions to avoid falling. Avoid  bending, stooping and avoid lifting weights greater than 10 lbs. Avoid prolong standing and walking. Avoid frequent bending and stooping  No lifting greater than 10 lbs. May use ice or moist heat for pain. Weight loss is of  benefit. Handicap license is approved.

## 2021-06-10 ENCOUNTER — Encounter: Payer: Self-pay | Admitting: Internal Medicine

## 2021-06-10 ENCOUNTER — Non-Acute Institutional Stay: Payer: Medicare Other | Admitting: Internal Medicine

## 2021-06-10 ENCOUNTER — Other Ambulatory Visit: Payer: Self-pay

## 2021-06-10 VITALS — BP 112/62 | HR 77 | Temp 96.9°F | Ht 65.0 in | Wt 146.3 lb

## 2021-06-10 DIAGNOSIS — M5412 Radiculopathy, cervical region: Secondary | ICD-10-CM | POA: Diagnosis not present

## 2021-06-10 DIAGNOSIS — R200 Anesthesia of skin: Secondary | ICD-10-CM

## 2021-06-10 DIAGNOSIS — E1140 Type 2 diabetes mellitus with diabetic neuropathy, unspecified: Secondary | ICD-10-CM | POA: Diagnosis not present

## 2021-06-10 NOTE — Progress Notes (Signed)
Location:  [place] of Service:  Clinic (12)  Provider:   Code Status: DNR Goals of Care:  Advanced Directives 06/10/2021  Does Patient Have a Medical Advance Directive? Yes  Type of Paramedic of [place];Living will  Does patient want to make changes to medical advance directive? No - Patient declined  Copy of [place] in Chart? No - copy requested  Would patient like information on creating a medical advance directive? -  Pre-existing out of facility DNR order (yellow form or pink MOST form) -     Chief Complaint  Patient presents with   Medical Management of Chronic Issues    Patient returns to the clinic for follow up.     HPI: Patient is a 87 y.o. male seen today for medical management of chronic diseases.    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  He has lost some weight he states his has been having issues with the food in the facility.  He mostly goes out to eat.  He has been in the cruise in December. He has also started using walker as needed.  Still drives  Past Medical History:  Diagnosis Date   Abdominal pain, other specified site    Allergic rhinitis due to pollen    Benign paroxysmal positional vertigo    Disturbance of skin sensation    left great toe   Elevated prostate specific antigen (PSA)    Hematuria, unspecified    Hypertrophy of prostate without urinary obstruction and other lower urinary tract symptoms (LUTS)    Impotence of organic origin    Left knee pain 05/28/2014    Macrocytosis    Other and unspecified hyperlipidemia    Other malaise and fatigue    Spermatocele    bilateral   Type II or unspecified type diabetes mellitus without mention of complication, uncontrolled    Unspecified essential hypertension    Unspecified glaucoma(365.9)     Past Surgical History:  Procedure Laterality Date   CATARACT EXTRACTION EXTRACAPSULAR  2008   bilateraly Dr. Katy Fitch   COLONOSCOPY  01/03/2002   normal Dr. Earlean Shawl   PROSTATE BIOPSY  1995   due to elevated PSA normal   TONSILLECTOMY      Allergies  Allergen Reactions   Metformin And Related Other (See Comments)    unknown   Sulfa Antibiotics Swelling   Tetanus Toxoids Other (See Comments)    unknown   Typhoid Vaccines Other (See Comments)    unknown    Outpatient Encounter Medications as of 06/10/2021  Medication Sig   aspirin 81 MG tablet Take 81 mg by mouth daily.   diclofenac (VOLTAREN) 75 MG EC tablet Take 1 tablet (75 mg total) by mouth 2 (two) times daily as needed. Do not take until after you have finished the prednisone   pioglitazone (ACTOS) 30 MG tablet TAKE 1 TABLET BY MOUTH EVERY DAY   sitaGLIPtin (JANUVIA) 100 MG tablet TAKE ONE TABLET BY MOUTH ONCE EVERY MORNING TO CONTROL DIABETES  glucose blood test strip Use one strip to test blood sugar once a day before breakfast . Dx E11.9 (Patient not taking: Reported on 06/10/2021)   MICROLET LANCETS MISC Use one lancet each morning to test blood sugar. Dx. E11.9 (Patient not taking: Reported on 06/10/2021)   [DISCONTINUED] predniSONE (STERAPRED UNI-PAK 21 TAB) 5 MG (21) TBPK tablet Take as directed   No facility-administered encounter medications on file as of 06/10/2021.    Review of Systems:  Review of Systems Review of Systems  Constitutional: Negative for activity change, appetite change, chills, diaphoresis, fatigue and fever.  HENT: Negative for mouth sores, postnasal drip, rhinorrhea, sinus pain and sore throat.   Respiratory: Negative  for apnea, cough, chest tightness, shortness of breath and wheezing.   Cardiovascular: Negative for chest pain, palpitations and leg swelling.  Gastrointestinal: Negative for abdominal distention, abdominal pain, constipation, diarrhea, nausea and vomiting.  Genitourinary: Negative for dysuria and frequency.  Musculoskeletal: Negative for arthralgias, joint swelling and myalgias.  Skin: Negative for rash.  Neurological: Negative for dizziness, syncope, weakness, light-headedness and numbness.  Psychiatric/Behavioral: Negative for behavioral problems, confusion and sleep disturbance.    Health Maintenance  Topic Date Due   TETANUS/TDAP  Never done   Zoster Vaccines- Shingrix (1 of 2) 10/07/1976   FOOT EXAM  11/09/2018   URINE MICROALBUMIN  05/26/2019   COVID-19 Vaccine (4 - Booster for Moderna series) 04/01/2021   INFLUENZA VACCINE  07/20/2021   HEMOGLOBIN A1C  12/01/2021   OPHTHALMOLOGY EXAM  03/19/2022   PNA vac Low Risk Adult  Completed   HPV VACCINES  Aged Out    Physical Exam: Vitals:   06/10/21 1448  BP: 112/62  Pulse: 77  Temp: (!) 96.9 F (36.1 C)  SpO2: 98%  Weight: 146 lb 4.8 oz (66.4 kg)  Height: 5\' 5"  (1.651 m)   Body mass index is 24.35 kg/m. Physical Exam Constitutional: Oriented to person, place, and time. Well-developed and well-nourished.  HENT:  Head: Normocephalic.  Mouth/Throat: Oropharynx is clear and moist.  Eyes: Pupils are equal, round, and reactive to light.  Neck: Neck supple.  Cardiovascular: Normal rate and normal heart sounds.  No murmur heard. Pulmonary/Chest: Effort normal and breath sounds normal. No respiratory distress. No wheezes. She has no rales.  Abdominal: Soft. Bowel sounds are normal. No distension. There is no tenderness. There is no rebound.  Musculoskeletal: No edema.  Lymphadenopathy: none Neurological: Alert and oriented to person, place, and time.  Right Shoulder has Restricted movement Left Shoulder is doing well with  no pain  Skin: Skin is warm and dry.  Psychiatric: Normal mood and affect. Behavior is normal. Thought content normal.   Labs reviewed: Basic Metabolic Panel: Recent Labs    09/25/20 0820 01/29/21 0800 06/01/21 0820  NA 141 140  --   K 4.1 4.4  --   CL 105 106  --   CO2 25 28  --   GLUCOSE 134* 129*  --   BUN 20 23  --   CREATININE 0.78 0.76  --   CALCIUM 8.6 9.1  --   TSH  --   --  2.92   Liver Function Tests: Recent Labs    09/25/20 0820 01/29/21 0800  AST 15 15  ALT 7* 9  BILITOT 0.9 0.7  PROT 6.3 6.5   No results for input(s): LIPASE, AMYLASE in the last 8760 hours. No results for input(s): AMMONIA in the last 8760 hours. CBC: Recent Labs    01/29/21 0800  06/01/21 0820  WBC 6.3 6.3  NEUTROABS 3,641 3,616  HGB 13.3 12.7*  HCT 39.6 36.6*  MCV 103.1* 102.2*  PLT 280 295   Lipid Panel: Recent Labs    01/29/21 0800  CHOL 147  HDL 65  LDLCALC 64  TRIG 101  CHOLHDL 2.3   Lab Results  Component Value Date   HGBA1C 6.3 (H) 06/01/2021    Procedures since last visit: NCV with EMG (electromyography)  Result Date: 05/12/2021 Magnus Sinning, MD     05/13/2021  5:46 AM EMG & NCV Findings: Evaluation of the left median motor nerve showed prolonged distal onset latency (4.4 ms), reduced amplitude (4.0 mV), and decreased conduction velocity (Elbow-Wrist, 47 m/s).  The left median (across palm) sensory nerve showed prolonged distal peak latency (Wrist, 3.8 ms).  All remaining nerves (as indicated in the following tables) were within normal limits.  All examined muscles (as indicated in the following table) showed no evidence of electrical instability.  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 There is no significant electrodiagnostic evidence of any other focal nerve entrapment, brachial plexopathy or cervical  radiculopathy.  As you know, this particular electrodiagnostic study cannot rule out chemical radiculitis or sensory only radiculopathy. Recommendations: 1.  Follow-up with referring physician. 2.  Continue current management of symptoms. ___________________________ Laurence Spates FAAPMR Board Certified, American Board of Physical Medicine and Rehabilitation Nerve Conduction Studies Anti Sensory Summary Table  Stim Site NR Peak (ms) Norm Peak (ms) P-T Amp (V) Norm P-T Amp Site1 Site2 Delta-P (ms) Dist (cm) Vel (m/s) Norm Vel (m/s) Left Median Acr Palm Anti Sensory (2nd Digit)  31.5C Wrist    *3.8 <3.6 13.4 >10 Wrist Palm 1.8 0.0   Palm    2.0 <2.0 18.4        Left Radial Anti Sensory (Base 1st Digit)  31.5C Wrist    2.3 <3.1 24.4  Wrist Base 1st Digit 2.3 0.0   Left Ulnar Anti Sensory (5th Digit)  31.9C Wrist    3.4 <3.7 16.2 >15.0 Wrist 5th Digit 3.4 14.0 41 >38 Motor Summary Table  Stim Site NR Onset (ms) Norm Onset (ms) O-P Amp (mV) Norm O-P Amp Site1 Site2 Delta-0 (ms) Dist (cm) Vel (m/s) Norm Vel (m/s) Left Median Motor (Abd Poll Brev)  31.7C Wrist    *4.4 <4.2 *4.0 >5 Elbow Wrist 4.5 21.0 *47 >50 Elbow    8.9  3.5        Left Ulnar Motor (Abd Dig Min)  31.9C Wrist    3.3 <4.2 5.9 >3 B Elbow Wrist 3.1 19.0 61 >53 B Elbow    6.4  5.3  A Elbow B Elbow 1.5 10.0 67 >53 A Elbow    7.9  5.2        EMG  Side Muscle Nerve Root Ins Act Fibs Psw Amp Dur Poly Recrt Int Fraser Din Comment Left Abd Poll Brev Median C8-T1 Nml Nml Nml Nml Nml 0 Nml Nml  Left 1stDorInt Ulnar C8-T1 Nml Nml Nml Nml Nml 0 Nml Nml  Left PronatorTeres Median C6-7 Nml Nml Nml Nml Nml 0 Nml Nml  Left Biceps Musculocut C5-6 Nml Nml Nml Nml Nml 0 Nml Nml  Left Deltoid Axillary C5-6 Nml Nml Nml Nml Nml 0 Nml Nml  Nerve Conduction Studies Anti Sensory Left/Right Comparison  Stim Site L Lat (ms) R Lat (ms) L-R Lat (ms)  L Amp (V) R Amp (V) L-R Amp (%) Site1 Site2 L Vel (m/s) R Vel (m/s) L-R Vel (m/s) Median Acr Palm Anti Sensory (2nd Digit)  31.5C Wrist  *3.8   13.4   Wrist Palm    Palm 2.0   18.4        Radial Anti Sensory (Base 1st Digit)  31.5C Wrist 2.3   24.4   Wrist Base 1st Digit    Ulnar Anti Sensory (5th Digit)  31.9C Wrist 3.4   16.2   Wrist 5th Digit 41   Motor Left/Right Comparison  Stim Site L Lat (ms) R Lat (ms) L-R Lat (ms) L Amp (mV) R Amp (mV) L-R Amp (%) Site1 Site2 L Vel (m/s) R Vel (m/s) L-R Vel (m/s) Median Motor (Abd Poll Brev)  31.7C Wrist *4.4   *4.0   Elbow Wrist *47   Elbow 8.9   3.5        Ulnar Motor (Abd Dig Min)  31.9C Wrist 3.3   5.9   B Elbow Wrist 61   B Elbow 6.4   5.3   A Elbow B Elbow 67   A Elbow 7.9   5.2        Waveforms:         Assessment/Plan 1. Type 2 diabetes mellitus with diabetic neuropathy, without long-term current use of insulin (HCC) Has lost some weight and A1C is less then 7 Will repeat A1C in few motnhs If still stays low can stop Actos  Cervical radiculopathy Was seen by Dr. Erlinda Hong and Dr. Louanne Skye, X-ray which has shown severe degenerative changes Did therapy No other symptoms Leg swelling Uses Ted hose  Up to date on all vaccinations Allergic to Tetanus toxoid Shingrix 7 years ago Not sure if he got the COVID fourth shot Patient said he will check and get it from [facility]  Labs/tests ordered:  * No order type specified * Next appt:  10/01/2021

## 2021-07-05 ENCOUNTER — Other Ambulatory Visit: Payer: Self-pay | Admitting: Internal Medicine

## 2021-07-05 DIAGNOSIS — Z794 Long term (current) use of insulin: Secondary | ICD-10-CM

## 2021-07-05 DIAGNOSIS — E119 Type 2 diabetes mellitus without complications: Secondary | ICD-10-CM

## 2021-07-24 DIAGNOSIS — L84 Corns and callosities: Secondary | ICD-10-CM | POA: Diagnosis not present

## 2021-07-24 DIAGNOSIS — L602 Onychogryphosis: Secondary | ICD-10-CM | POA: Diagnosis not present

## 2021-07-24 DIAGNOSIS — E1159 Type 2 diabetes mellitus with other circulatory complications: Secondary | ICD-10-CM | POA: Diagnosis not present

## 2021-08-27 DIAGNOSIS — D1801 Hemangioma of skin and subcutaneous tissue: Secondary | ICD-10-CM | POA: Diagnosis not present

## 2021-08-27 DIAGNOSIS — L570 Actinic keratosis: Secondary | ICD-10-CM | POA: Diagnosis not present

## 2021-08-27 DIAGNOSIS — L814 Other melanin hyperpigmentation: Secondary | ICD-10-CM | POA: Diagnosis not present

## 2021-08-27 DIAGNOSIS — D229 Melanocytic nevi, unspecified: Secondary | ICD-10-CM | POA: Diagnosis not present

## 2021-08-27 DIAGNOSIS — L821 Other seborrheic keratosis: Secondary | ICD-10-CM | POA: Diagnosis not present

## 2021-08-28 ENCOUNTER — Other Ambulatory Visit: Payer: Self-pay | Admitting: Internal Medicine

## 2021-09-09 DIAGNOSIS — Z23 Encounter for immunization: Secondary | ICD-10-CM | POA: Diagnosis not present

## 2021-09-15 DIAGNOSIS — E119 Type 2 diabetes mellitus without complications: Secondary | ICD-10-CM | POA: Diagnosis not present

## 2021-09-15 DIAGNOSIS — H1045 Other chronic allergic conjunctivitis: Secondary | ICD-10-CM | POA: Diagnosis not present

## 2021-09-15 DIAGNOSIS — H3554 Dystrophies primarily involving the retinal pigment epithelium: Secondary | ICD-10-CM | POA: Diagnosis not present

## 2021-09-15 DIAGNOSIS — Z961 Presence of intraocular lens: Secondary | ICD-10-CM | POA: Diagnosis not present

## 2021-09-15 DIAGNOSIS — H04123 Dry eye syndrome of bilateral lacrimal glands: Secondary | ICD-10-CM | POA: Diagnosis not present

## 2021-09-15 DIAGNOSIS — H401131 Primary open-angle glaucoma, bilateral, mild stage: Secondary | ICD-10-CM | POA: Diagnosis not present

## 2021-09-24 DIAGNOSIS — Z23 Encounter for immunization: Secondary | ICD-10-CM | POA: Diagnosis not present

## 2021-09-29 ENCOUNTER — Encounter (INDEPENDENT_AMBULATORY_CARE_PROVIDER_SITE_OTHER): Payer: Medicare Other | Admitting: Ophthalmology

## 2021-09-30 DIAGNOSIS — D485 Neoplasm of uncertain behavior of skin: Secondary | ICD-10-CM | POA: Diagnosis not present

## 2021-09-30 DIAGNOSIS — L989 Disorder of the skin and subcutaneous tissue, unspecified: Secondary | ICD-10-CM | POA: Diagnosis not present

## 2021-09-30 DIAGNOSIS — L814 Other melanin hyperpigmentation: Secondary | ICD-10-CM | POA: Diagnosis not present

## 2021-09-30 DIAGNOSIS — L570 Actinic keratosis: Secondary | ICD-10-CM | POA: Diagnosis not present

## 2021-09-30 DIAGNOSIS — L821 Other seborrheic keratosis: Secondary | ICD-10-CM | POA: Diagnosis not present

## 2021-09-30 DIAGNOSIS — D229 Melanocytic nevi, unspecified: Secondary | ICD-10-CM | POA: Diagnosis not present

## 2021-09-30 DIAGNOSIS — E1140 Type 2 diabetes mellitus with diabetic neuropathy, unspecified: Secondary | ICD-10-CM | POA: Diagnosis not present

## 2021-10-01 ENCOUNTER — Other Ambulatory Visit: Payer: Self-pay

## 2021-10-01 DIAGNOSIS — E1140 Type 2 diabetes mellitus with diabetic neuropathy, unspecified: Secondary | ICD-10-CM

## 2021-10-02 LAB — CBC WITH DIFFERENTIAL/PLATELET
Absolute Monocytes: 593 cells/uL (ref 200–950)
Basophils Absolute: 78 cells/uL (ref 0–200)
Basophils Relative: 1.5 %
Eosinophils Absolute: 88 cells/uL (ref 15–500)
Eosinophils Relative: 1.7 %
HCT: 38.8 % (ref 38.5–50.0)
Hemoglobin: 12.9 g/dL — ABNORMAL LOW (ref 13.2–17.1)
Lymphs Abs: 1602 cells/uL (ref 850–3900)
MCH: 33.8 pg — ABNORMAL HIGH (ref 27.0–33.0)
MCHC: 33.2 g/dL (ref 32.0–36.0)
MCV: 101.6 fL — ABNORMAL HIGH (ref 80.0–100.0)
MPV: 9.6 fL (ref 7.5–12.5)
Monocytes Relative: 11.4 %
Neutro Abs: 2839 cells/uL (ref 1500–7800)
Neutrophils Relative %: 54.6 %
Platelets: 311 10*3/uL (ref 140–400)
RBC: 3.82 10*6/uL — ABNORMAL LOW (ref 4.20–5.80)
RDW: 18.4 % — ABNORMAL HIGH (ref 11.0–15.0)
Total Lymphocyte: 30.8 %
WBC: 5.2 10*3/uL (ref 3.8–10.8)

## 2021-10-02 LAB — COMPLETE METABOLIC PANEL WITH GFR
AG Ratio: 1.6 (calc) (ref 1.0–2.5)
ALT: 10 U/L (ref 9–46)
AST: 16 U/L (ref 10–35)
Albumin: 4.1 g/dL (ref 3.6–5.1)
Alkaline phosphatase (APISO): 77 U/L (ref 35–144)
BUN: 20 mg/dL (ref 7–25)
CO2: 27 mmol/L (ref 20–32)
Calcium: 8.8 mg/dL (ref 8.6–10.3)
Chloride: 106 mmol/L (ref 98–110)
Creat: 0.7 mg/dL (ref 0.70–1.22)
Globulin: 2.5 g/dL (calc) (ref 1.9–3.7)
Glucose, Bld: 134 mg/dL — ABNORMAL HIGH (ref 65–99)
Potassium: 4.1 mmol/L (ref 3.5–5.3)
Sodium: 140 mmol/L (ref 135–146)
Total Bilirubin: 0.7 mg/dL (ref 0.2–1.2)
Total Protein: 6.6 g/dL (ref 6.1–8.1)
eGFR: 85 mL/min/{1.73_m2} (ref >=60)

## 2021-10-02 LAB — LIPID PANEL
Cholesterol: 141 mg/dL (ref <200)
HDL: 65 mg/dL (ref >=40)
LDL Cholesterol (Calc): 60 mg/dL (calc)
Non-HDL Cholesterol (Calc): 76 mg/dL (calc) (ref <130)
Total CHOL/HDL Ratio: 2.2 (calc) (ref <5.0)
Triglycerides: 84 mg/dL (ref <150)

## 2021-10-02 LAB — HEMOGLOBIN A1C
Hgb A1c MFr Bld: 6.3 % of total Hgb — ABNORMAL HIGH (ref <5.7)
Mean Plasma Glucose: 134 mg/dL
eAG (mmol/L): 7.4 mmol/L

## 2021-10-07 ENCOUNTER — Telehealth: Payer: Self-pay

## 2021-10-07 ENCOUNTER — Non-Acute Institutional Stay: Payer: Medicare Other | Admitting: Internal Medicine

## 2021-10-07 ENCOUNTER — Encounter: Payer: Self-pay | Admitting: Internal Medicine

## 2021-10-07 ENCOUNTER — Other Ambulatory Visit: Payer: Self-pay

## 2021-10-07 VITALS — BP 122/58 | HR 85 | Temp 96.2°F | Ht 65.0 in | Wt 142.3 lb

## 2021-10-07 DIAGNOSIS — M5412 Radiculopathy, cervical region: Secondary | ICD-10-CM

## 2021-10-07 DIAGNOSIS — E1140 Type 2 diabetes mellitus with diabetic neuropathy, unspecified: Secondary | ICD-10-CM | POA: Diagnosis not present

## 2021-10-07 NOTE — Progress Notes (Signed)
Location:  [facility]  Clinic (12)  Provider:   Code Status:  Goals of Care:  Advanced Directives 10/07/2021  Does Patient Have a Medical Advance Directive? Yes  Type of Paramedic of [place];Living will  Does patient want to make changes to medical advance directive? No - Patient declined  Copy of [place] in Chart? Yes - validated most recent copy scanned in chart (See row information)  Would patient like information on creating a medical advance directive? -  Pre-existing out of facility DNR order (yellow form or pink MOST form) -     Chief Complaint  Patient presents with   Medical Management of Chronic Issues    Patient returns to the clinic for 4 month follow up.    Quality Metric Gaps    Shingrix, foot exam, urine micro.     HPI: Patient is a 87 y.o. male seen today for medical management of chronic diseases.    Patient has h/o Diabetes mellitus, LE edema, Right Frozen Shoulder,And B12 def Post Prandial Dizziness Echo in the past is Normal EF  Came for Regular visit Caregiver for his wife with dementia Was seen by Dr Erlinda Hong and Dr Morton Stall for [facility] and Cervical Radiculopathy  Doing well  Continues ot have issue with Post prandial Dizziness. No Other sympotoms like chest pain or SOB. Previous work up including Echo has been negative Not worse Still drives Use walker as needed   Past Medical History:  Diagnosis Date   Abdominal pain, other specified site    Allergic rhinitis due to pollen    Benign paroxysmal positional vertigo    Disturbance of skin sensation    left great toe   Elevated prostate specific antigen (PSA)    Hematuria, unspecified    Hypertrophy of prostate without urinary obstruction and other lower urinary tract symptoms (LUTS)    Impotence of organic origin    Left knee pain 05/28/2014   Macrocytosis    Other and unspecified hyperlipidemia    Other malaise and  fatigue    Spermatocele    bilateral   Type II or unspecified type diabetes mellitus without mention of complication, uncontrolled    Unspecified essential hypertension    Unspecified glaucoma(365.9)     Past Surgical History:  Procedure Laterality Date   CATARACT EXTRACTION EXTRACAPSULAR  2008   bilateraly Dr. Katy Fitch   COLONOSCOPY  01/03/2002   normal Dr. Earlean Shawl   PROSTATE BIOPSY  1995   due to elevated PSA normal   TONSILLECTOMY      Allergies  Allergen Reactions   Metformin And Related Other (See Comments)    unknown   Sulfa Antibiotics Swelling   Tetanus Toxoids Other (See Comments)    unknown   Typhoid Vaccines Other (See Comments)    unknown    Outpatient Encounter Medications as of 10/07/2021  Medication Sig   diclofenac (VOLTAREN) 75 MG EC tablet Take 1 tablet (75 mg total) by mouth 2 (two) times daily as needed. Do not take until after you have finished the prednisone   sitaGLIPtin (JANUVIA) 100 MG tablet TAKE 1 TABLET IN THE MORNING TO CONTROL DIABETES.   [DISCONTINUED] aspirin 81 MG tablet Take 81 mg by mouth daily.   [DISCONTINUED] glucose blood test strip Use one strip to test blood sugar once a day before breakfast . Dx E11.9 (Patient not taking: Reported on 06/10/2021)   [DISCONTINUED] MICROLET LANCETS MISC Use one  lancet each morning to test blood sugar. Dx. E11.9 (Patient not taking: Reported on 06/10/2021)   [DISCONTINUED] pioglitazone (ACTOS) 30 MG tablet TAKE 1 TABLET ONCE DAILY.   No facility-administered encounter medications on file as of 10/07/2021.    Review of Systems:  Review of Systems Review of Systems  Constitutional: Negative for activity change, appetite change, chills, diaphoresis, fatigue and fever.  HENT: Negative for mouth sores, postnasal drip, rhinorrhea, sinus pain and sore throat.   Respiratory: Negative for apnea, cough, chest tightness, shortness of breath and wheezing.   Cardiovascular: Negative for chest pain, palpitations and  leg swelling.  Gastrointestinal: Negative for abdominal distention, abdominal pain, constipation, diarrhea, nausea and vomiting.  Genitourinary: Negative for dysuria and frequency.  Musculoskeletal: Negative for arthralgias, joint swelling and myalgias.  Skin: Negative for rash.  Neurological: Negative for dizziness, syncope, weakness, light-headedness and numbness.  Psychiatric/Behavioral: Negative for behavioral problems, confusion and sleep disturbance.    Health Maintenance  Topic Date Due   Zoster Vaccines- Shingrix (1 of 2) 10/07/1976   FOOT EXAM  11/09/2018   URINE MICROALBUMIN  05/26/2019   COVID-19 Vaccine (5 - Booster for Moderna series) 11/04/2021   OPHTHALMOLOGY EXAM  03/19/2022   HEMOGLOBIN A1C  03/31/2022   Pneumonia Vaccine 73+ Years old  Completed   INFLUENZA VACCINE  Completed   HPV VACCINES  Aged Out   TETANUS/TDAP  Discontinued    Physical Exam: Vitals:   10/07/21 1348  BP: (!) 122/58  Pulse: 85  Temp: (!) 96.2 F (35.7 C)  TempSrc: Temporal  SpO2: 98%  Weight: 142 lb 4.8 oz (64.5 kg)  Height: 5\' 5"  (1.651 m)   Body mass index is 23.68 kg/m. Physical Exam Constitutional: Oriented to person, place, and time. Well-developed and well-nourished.  HENT:  Head: Normocephalic.  Mouth/Throat: Oropharynx is clear and moist.  Eyes: Pupils are equal, round, and reactive to light.  Neck: Neck supple.  Cardiovascular: Normal rate and normal heart sounds.  No murmur heard. Pulmonary/Chest: Effort normal and breath sounds normal. No respiratory distress. No wheezes. She has no rales.  Abdominal: Soft. Bowel sounds are normal. No distension. There is no tenderness. There is no rebound.  Musculoskeletal: Trace Edema Bilateral Right Shoulder has Restricted movement Left Shoulder is doing well with no pain  Lymphadenopathy: none Neurological: Alert and oriented to person, place, and time.  Skin: Skin is warm and dry.  Psychiatric: Normal mood and affect. Behavior  is normal. Thought content normal.    Labs reviewed: Basic Metabolic Panel: Recent Labs    01/29/21 0800 06/01/21 0820 09/30/21 0908  NA 140  --  140  K 4.4  --  4.1  CL 106  --  106  CO2 28  --  27  GLUCOSE 129*  --  134*  BUN 23  --  20  CREATININE 0.76  --  0.70  CALCIUM 9.1  --  8.8  TSH  --  2.92  --    Liver Function Tests: Recent Labs    01/29/21 0800 09/30/21 0908  AST 15 16  ALT 9 10  BILITOT 0.7 0.7  PROT 6.5 6.6   No results for input(s): LIPASE, AMYLASE in the last 8760 hours. No results for input(s): AMMONIA in the last 8760 hours. CBC: Recent Labs    01/29/21 0800 06/01/21 0820 09/30/21 0908  WBC 6.3 6.3 5.2  NEUTROABS 3,641 3,616 2,839  HGB 13.3 12.7* 12.9*  HCT 39.6 36.6* 38.8  MCV 103.1* 102.2* 101.6*  PLT  280 295 311   Lipid Panel: Recent Labs    01/29/21 0800 09/30/21 0908  CHOL 147 141  HDL 65 65  LDLCALC 64 60  TRIG 101 84  CHOLHDL 2.3 2.2   Lab Results  Component Value Date   HGBA1C 6.3 (H) 09/30/2021    Procedures since last visit: No results found.  Assessment/Plan Type 2 diabetes mellitus with diabetic neuropathy, without long-term current use of insulin (HCC) Discontinue Actos and Aspirin Continue Januvia only Cervical radiculopathy Continue supportive care   Labs/tests ordered:  * No order type specified * Next appt:  Visit date not found

## 2021-10-07 NOTE — Telephone Encounter (Signed)
Patient called wanting clarification if he was to continue taking Januvia. I informed patient that Dr. Veleta Miners wants him to discontinue aspirin and actos and take Januvia only. Patient verbalized his umderstanding

## 2021-10-22 DIAGNOSIS — L988 Other specified disorders of the skin and subcutaneous tissue: Secondary | ICD-10-CM | POA: Diagnosis not present

## 2021-10-22 DIAGNOSIS — L570 Actinic keratosis: Secondary | ICD-10-CM | POA: Diagnosis not present

## 2021-11-06 DIAGNOSIS — E1159 Type 2 diabetes mellitus with other circulatory complications: Secondary | ICD-10-CM | POA: Diagnosis not present

## 2021-11-06 DIAGNOSIS — L84 Corns and callosities: Secondary | ICD-10-CM | POA: Diagnosis not present

## 2021-11-06 DIAGNOSIS — L602 Onychogryphosis: Secondary | ICD-10-CM | POA: Diagnosis not present

## 2022-01-02 ENCOUNTER — Other Ambulatory Visit: Payer: Self-pay | Admitting: Internal Medicine

## 2022-01-02 DIAGNOSIS — E119 Type 2 diabetes mellitus without complications: Secondary | ICD-10-CM

## 2022-02-24 DIAGNOSIS — E1140 Type 2 diabetes mellitus with diabetic neuropathy, unspecified: Secondary | ICD-10-CM | POA: Diagnosis not present

## 2022-02-25 ENCOUNTER — Other Ambulatory Visit: Payer: Self-pay

## 2022-02-25 DIAGNOSIS — E1140 Type 2 diabetes mellitus with diabetic neuropathy, unspecified: Secondary | ICD-10-CM

## 2022-02-26 DIAGNOSIS — L84 Corns and callosities: Secondary | ICD-10-CM | POA: Diagnosis not present

## 2022-02-26 DIAGNOSIS — L602 Onychogryphosis: Secondary | ICD-10-CM | POA: Diagnosis not present

## 2022-02-26 DIAGNOSIS — E1159 Type 2 diabetes mellitus with other circulatory complications: Secondary | ICD-10-CM | POA: Diagnosis not present

## 2022-03-02 LAB — CBC WITH DIFFERENTIAL/PLATELET
Absolute Monocytes: 1224 cells/uL — ABNORMAL HIGH (ref 200–950)
Basophils Absolute: 79 cells/uL (ref 0–200)
Basophils Relative: 1.1 %
Eosinophils Absolute: 216 cells/uL (ref 15–500)
Eosinophils Relative: 3 %
HCT: 39.9 % (ref 38.5–50.0)
Hemoglobin: 13.5 g/dL (ref 13.2–17.1)
Lymphs Abs: 1793 cells/uL (ref 850–3900)
MCH: 33.5 pg — ABNORMAL HIGH (ref 27.0–33.0)
MCHC: 33.8 g/dL (ref 32.0–36.0)
MCV: 99 fL (ref 80.0–100.0)
MPV: 9.7 fL (ref 7.5–12.5)
Monocytes Relative: 17 %
Neutro Abs: 3888 cells/uL (ref 1500–7800)
Neutrophils Relative %: 54 %
Platelets: 328 10*3/uL (ref 140–400)
RBC: 4.03 10*6/uL — ABNORMAL LOW (ref 4.20–5.80)
RDW: 17.6 % — ABNORMAL HIGH (ref 11.0–15.0)
Total Lymphocyte: 24.9 %
WBC: 7.2 10*3/uL (ref 3.8–10.8)

## 2022-03-02 LAB — COMPLETE METABOLIC PANEL WITH GFR
AG Ratio: 1.8 (calc) (ref 1.0–2.5)
ALT: 12 U/L (ref 9–46)
AST: 15 U/L (ref 10–35)
Albumin: 4.1 g/dL (ref 3.6–5.1)
Alkaline phosphatase (APISO): 85 U/L (ref 35–144)
BUN: 22 mg/dL (ref 7–25)
CO2: 30 mmol/L (ref 20–32)
Calcium: 8.7 mg/dL (ref 8.6–10.3)
Chloride: 104 mmol/L (ref 98–110)
Creat: 0.73 mg/dL (ref 0.70–1.22)
Globulin: 2.3 g/dL (calc) (ref 1.9–3.7)
Glucose, Bld: 156 mg/dL — ABNORMAL HIGH (ref 65–99)
Potassium: 4.4 mmol/L (ref 3.5–5.3)
Sodium: 140 mmol/L (ref 135–146)
Total Bilirubin: 1 mg/dL (ref 0.2–1.2)
Total Protein: 6.4 g/dL (ref 6.1–8.1)
eGFR: 84 mL/min/{1.73_m2} (ref >=60)

## 2022-03-02 LAB — HEMOGLOBIN A1C
Hgb A1c MFr Bld: 7.5 % of total Hgb — ABNORMAL HIGH (ref <5.7)
Mean Plasma Glucose: 169 mg/dL
eAG (mmol/L): 9.3 mmol/L

## 2022-03-03 ENCOUNTER — Encounter: Payer: Self-pay | Admitting: Internal Medicine

## 2022-03-03 ENCOUNTER — Non-Acute Institutional Stay: Payer: Medicare Other | Admitting: Internal Medicine

## 2022-03-03 ENCOUNTER — Other Ambulatory Visit: Payer: Self-pay

## 2022-03-03 VITALS — BP 114/70 | HR 107 | Temp 97.4°F | Ht 65.0 in | Wt 139.4 lb

## 2022-03-03 DIAGNOSIS — R040 Epistaxis: Secondary | ICD-10-CM

## 2022-03-03 DIAGNOSIS — M25512 Pain in left shoulder: Secondary | ICD-10-CM

## 2022-03-03 DIAGNOSIS — M5412 Radiculopathy, cervical region: Secondary | ICD-10-CM

## 2022-03-03 DIAGNOSIS — R200 Anesthesia of skin: Secondary | ICD-10-CM

## 2022-03-03 DIAGNOSIS — G8929 Other chronic pain: Secondary | ICD-10-CM | POA: Diagnosis not present

## 2022-03-03 DIAGNOSIS — E1140 Type 2 diabetes mellitus with diabetic neuropathy, unspecified: Secondary | ICD-10-CM

## 2022-03-03 MED ORDER — PIOGLITAZONE HCL 15 MG PO TABS: 15.0000 mg | ORAL_TABLET | Freq: Every day | ORAL | 3 refills | Status: DC

## 2022-03-03 MED ORDER — OXYMETAZOLINE HCL 0.05 % NA SOLN
1.0000 | Freq: Two times a day (BID) | NASAL | 0 refills | Status: DC
Start: 2022-03-03 — End: 2022-11-24

## 2022-03-03 NOTE — Progress Notes (Signed)
? ?Location:  [place] ?  ?Place of Service:  Clinic (12) ? ?Provider:  ? ?Code Status:  ?Goals of Care:  ?Advanced Directives 03/03/2022  ?Does Patient Have a Medical Advance Directive? No  ?Type of Advance Directive -  ?Does patient want to make changes to medical advance directive? -  ?Copy of [place] in Chart? -  ?Would patient like information on creating a medical advance directive? No - Patient declined  ?Pre-existing out of facility DNR order (yellow form or pink MOST form) -  ? ? ? ?Chief Complaint  ?Patient presents with  ? Medical Management of Chronic Issues  ?  Patient returns to the clinic for 5 month follow up.   ? Quality Metric Gaps  ?  Verified [facility] and [facility] patient is due for Shingrix, foot exam, Urine micro., #5 covid vaccine  ? ? ?HPI: Patient is a 88 y.o. male seen today for medical management of chronic diseases.   ? ?Patient has h/o Diabetes mellitus, LE edema, Right Frozen Shoulder,And B12 def ?Post Prandial Dizziness Echo in the past is Normal EF ? Cervical Radiculopathy with Left Arm Numbness ? ?He had Nose bleed Last night. Also has been having some rhinorrhea ?No Other issues ?Numbness in his Left Hand ?No fall ?Walks with the walker ?  ?Past Medical History:  ?Diagnosis Date  ? Abdominal pain, other specified site   ? Allergic rhinitis due to pollen   ? Benign paroxysmal positional vertigo   ? Disturbance of skin sensation   ? left great toe  ? Elevated prostate specific antigen (PSA)   ? Hematuria, unspecified   ? Hypertrophy of prostate without urinary obstruction and other lower urinary tract symptoms (LUTS)   ? Impotence of organic origin   ? Left knee pain 05/28/2014  ? Macrocytosis   ? Other and unspecified hyperlipidemia   ? Other malaise and fatigue   ? Spermatocele   ? bilateral  ? Type II or unspecified type diabetes mellitus without mention of complication, uncontrolled   ? Unspecified essential hypertension   ? Unspecified glaucoma(365.9)    ? ? ?Past Surgical History:  ?Procedure Laterality Date  ? CATARACT EXTRACTION EXTRACAPSULAR  2008  ? bilateraly Dr. Katy Fitch  ? COLONOSCOPY  01/03/2002  ? normal Dr. Earlean Shawl  ? PROSTATE BIOPSY  1995  ? due to elevated PSA normal  ? TONSILLECTOMY    ? ? ?Allergies  ?Allergen Reactions  ? Metformin And Related Other (See Comments)  ?  unknown  ? Sulfa Antibiotics Swelling  ? Tetanus Toxoids Other (See Comments)  ?  unknown  ? Typhoid Vaccines Other (See Comments)  ?  unknown  ? ? ?Outpatient Encounter Medications as of 03/03/2022  ?Medication Sig  ? aspirin 81 MG EC tablet Take 81 mg by mouth daily.  ? diclofenac (VOLTAREN) 75 MG EC tablet Take 1 tablet (75 mg total) by mouth 2 (two) times daily as needed. Do not take until after you have finished the prednisone  ? sitaGLIPtin (JANUVIA) 100 MG tablet TAKE 1 TABLET IN THE MORNING TO CONTROL DIABETES.  ? ?No facility-administered encounter medications on file as of 03/03/2022.  ? ? ?Review of Systems:  ?Review of Systems  ?Constitutional:  Negative for activity change, appetite change and unexpected weight change.  ?HENT:  Positive for nosebleeds and rhinorrhea.   ?Respiratory:  Negative for cough and shortness of breath.   ?Cardiovascular:  Negative for leg swelling.  ?Gastrointestinal:  Negative for constipation.  ?  Genitourinary:  Negative for frequency.  ?Musculoskeletal:  Negative for arthralgias, gait problem and myalgias.  ?Skin: Negative.  Negative for rash.  ?Neurological:  Negative for dizziness and weakness.  ?Psychiatric/Behavioral:  Negative for confusion and sleep disturbance.   ?All other systems reviewed and are negative. ? ?Health Maintenance  ?Topic Date Due  ? Zoster Vaccines- Shingrix (1 of 2) 10/07/1976  ? FOOT EXAM  11/09/2018  ? URINE MICROALBUMIN  05/26/2019  ? COVID-19 Vaccine (5 - Booster for Moderna series) 11/04/2021  ? OPHTHALMOLOGY EXAM  03/19/2022  ? HEMOGLOBIN A1C  08/27/2022  ? Pneumonia Vaccine 31+ Years old  Completed  ? INFLUENZA VACCINE   Completed  ? HPV VACCINES  Aged Out  ? TETANUS/TDAP  Discontinued  ? ? ?Physical Exam: ?Vitals:  ? 03/03/22 1420  ?BP: 114/70  ?Pulse: (!) 107  ?Temp: (!) 97.4 ?F (36.3 ?C)  ?SpO2: 98%  ?Weight: 139 lb 6.4 oz (63.2 kg)  ?Height: '5\' 5"'$  (1.651 m)  ? ?Body mass index is 23.2 kg/m?Marland Kitchen ?Physical Exam ?Vitals reviewed.  ?Constitutional:   ?   Appearance: Normal appearance.  ?HENT:  ?   Head: Normocephalic.  ?   Right Ear: Tympanic membrane normal.  ?   Left Ear: Tympanic membrane normal.  ?   Ears:  ?   Comments: Wax in both Ears ?   Nose: Congestion present.  ?   Mouth/Throat:  ?   Mouth: Mucous membranes are moist.  ?   Pharynx: Oropharynx is clear.  ?Eyes:  ?   Pupils: Pupils are equal, round, and reactive to light.  ?Cardiovascular:  ?   Rate and Rhythm: Normal rate and regular rhythm.  ?   Pulses: Normal pulses.  ?   Heart sounds: No murmur heard. ?Pulmonary:  ?   Effort: Pulmonary effort is normal. No respiratory distress.  ?   Breath sounds: Normal breath sounds. No rales.  ?Abdominal:  ?   General: Abdomen is flat. Bowel sounds are normal.  ?   Palpations: Abdomen is soft.  ?Musculoskeletal:     ?   General: No swelling.  ?   Cervical back: Neck supple.  ?   Comments: Foot exam ?Normal Sensory  ?Dorsalis Pedalis palpable ?No Open Lesions ? ?  ?Skin: ?   General: Skin is warm.  ?Neurological:  ?   General: No focal deficit present.  ?   Mental Status: He is alert and oriented to person, place, and time.  ?Psychiatric:     ?   Mood and Affect: Mood normal.     ?   Thought Content: Thought content normal.  ? ? ?Labs reviewed: ?Basic Metabolic Panel: ?Recent Labs  ?  06/01/21 ?0820 09/30/21 ?0908 02/24/22 ?1502  ?NA  --  140 140  ?K  --  4.1 4.4  ?CL  --  106 104  ?CO2  --  27 30  ?GLUCOSE  --  134* 156*  ?BUN  --  20 22  ?CREATININE  --  0.70 0.73  ?CALCIUM  --  8.8 8.7  ?TSH 2.92  --   --   ? ?Liver Function Tests: ?Recent Labs  ?  09/30/21 ?0908 02/24/22 ?1502  ?AST 16 15  ?ALT 10 12  ?BILITOT 0.7 1.0  ?PROT 6.6 6.4   ? ?No results for input(s): LIPASE, AMYLASE in the last 8760 hours. ?No results for input(s): AMMONIA in the last 8760 hours. ?CBC: ?Recent Labs  ?  06/01/21 ?0820 09/30/21 ?0908 02/24/22 ?1502  ?  WBC 6.3 5.2 7.2  ?NEUTROABS 3,616 2,839 3,888  ?HGB 12.7* 12.9* 13.5  ?HCT 36.6* 38.8 39.9  ?MCV 102.2* 101.6* 99.0  ?PLT 295 311 328  ? ?Lipid Panel: ?Recent Labs  ?  09/30/21 ?0908  ?CHOL 141  ?HDL 65  ?[place] 60  ?TRIG 84  ?CHOLHDL 2.2  ? ?Lab Results  ?Component Value Date  ? HGBA1C 7.5 (H) 02/24/2022  ? ? ?Procedures since last visit: ?No results found. ? ?Assessment/Plan ?1. Type 2 diabetes mellitus with diabetic neuropathy, without long-term current use of insulin ([place]) ?Foot exam done ?A1C has jumped off Actos ?He was also non compliant with his diet due to holidays ?Will restart Actos lower dose 15 mg ?Repeat A1C in 4 months ? ? ?2. Cervical radiculopathy ?Symptoms controlled ? ?3. Left arm numbness ? ? ?4. Chronic left shoulder pain ?Tylenol PRn ?5 Nose bleed ?One episode ?Will have Afrin for PRN use in case if he needs it ? ?Labs/tests ordered:  * No order type specified * ?Next appt:  Visit date not found ? ? ? ? ?

## 2022-03-16 DIAGNOSIS — E119 Type 2 diabetes mellitus without complications: Secondary | ICD-10-CM | POA: Diagnosis not present

## 2022-03-16 DIAGNOSIS — H1045 Other chronic allergic conjunctivitis: Secondary | ICD-10-CM | POA: Diagnosis not present

## 2022-03-16 DIAGNOSIS — H3554 Dystrophies primarily involving the retinal pigment epithelium: Secondary | ICD-10-CM | POA: Diagnosis not present

## 2022-03-16 DIAGNOSIS — H04123 Dry eye syndrome of bilateral lacrimal glands: Secondary | ICD-10-CM | POA: Diagnosis not present

## 2022-03-16 DIAGNOSIS — H401131 Primary open-angle glaucoma, bilateral, mild stage: Secondary | ICD-10-CM | POA: Diagnosis not present

## 2022-03-16 DIAGNOSIS — Z961 Presence of intraocular lens: Secondary | ICD-10-CM | POA: Diagnosis not present

## 2022-03-29 ENCOUNTER — Non-Acute Institutional Stay: Payer: Medicare Other | Admitting: Orthopedic Surgery

## 2022-03-29 ENCOUNTER — Encounter: Payer: Self-pay | Admitting: Orthopedic Surgery

## 2022-03-29 VITALS — BP 115/66 | HR 86 | Temp 97.6°F | Ht 65.0 in | Wt 141.0 lb

## 2022-03-29 DIAGNOSIS — Z0000 Encounter for general adult medical examination without abnormal findings: Secondary | ICD-10-CM | POA: Diagnosis not present

## 2022-03-29 NOTE — Patient Instructions (Signed)
?  Mr. Scheffler , ?Thank you for taking time to come for your Medicare Wellness Visit. I appreciate your ongoing commitment to your health goals. Please review the following plan we discussed and let me know if I can assist you in the future.  ? ?These are the goals we discussed: ? Goals   ? ?  Check Blood sugar   ?  Patient will check blood sugars ?  ? ?  ?  ?This is a list of the screening recommended for you and due dates:  ?Health Maintenance  ?Topic Date Due  ? Urine Protein Check  05/26/2019  ? Eye exam for diabetics  03/19/2022  ? COVID-19 Vaccine (4 - Booster for Moderna series) 04/14/2022*  ? Flu Shot  07/20/2022  ? Hemoglobin A1C  08/27/2022  ? Complete foot exam   03/04/2023  ? Pneumonia Vaccine  Completed  ? HPV Vaccine  Aged Out  ? Tetanus Vaccine  Discontinued  ? Zoster (Shingles) Vaccine  Discontinued  ?*Topic was postponed. The date shown is not the original due date.  ? ? ?

## 2022-03-29 NOTE — Progress Notes (Signed)
? ?Subjective:  ? George [name] is a 88 y.o. male who presents for Medicare Annual/Subsequent preventive examination. ? ?Place of Service: [facility] ?Provider: Windell Moulding, AGNP-C  ? ?Review of Systems    ? ?Cardiac Risk Factors include: advanced age (>64mn, >>54women);hypertension;diabetes mellitus ? ?   ?Objective:  ?  ?Today's Vitals  ? 03/29/22 1336  ?BP: 115/66  ?Pulse: 86  ?Temp: 97.6 ?F (36.4 ?C)  ?TempSrc: Temporal  ?SpO2: 96%  ?Weight: 141 lb (64 kg)  ?Height: '5\' 5"'$  (1.651 m)  ? ?Body mass index is 23.46 kg/m?. ? ? ?  03/29/2022  ? 11:48 AM 03/03/2022  ?  2:18 PM 10/07/2021  ?  3:11 PM 06/10/2021  ?  3:09 PM 02/26/2021  ?  5:33 PM 04/10/2019  ?  8:49 AM 03/28/2019  ?  2:01 PM  ?Advanced Directives  ?Does Patient Have a Medical Advance Directive? Yes No Yes Yes Yes Yes Yes  ?Type of AParamed[place]of [place]  [place] PineLiving will [place]Living will Living will;Healthcare Power of ACold BayOut of facility DNR (pink MOST or yellow form);Living will HHat IslandOut of facility DNR (pink MOST or yellow form);Living will  ?Does patient want to make changes to medical advance directive? No - Patient declined  No - Patient declined No - Patient declined No - Patient declined No - Patient declined No - Patient declined  ?Copy of [place]? Yes - validated most recent copy scanned in chart (See row information)  Yes - validated most recent copy scanned in chart (See row information) No - copy requested  Yes - validated most recent copy scanned in chart (See row information) Yes - validated most recent copy scanned in chart (See row information)  ?Would patient like information on creating a medical advance directive?  No - Patient declined       ?Pre-existing out of facility DNR order (yellow form or pink MOST form)      Pink MOST form placed in chart (order not  valid for inpatient use) Pink MOST form placed in chart (order not valid for inpatient use)  ? ? ?Current Medications (verified) ?Outpatient Encounter Medications as of 03/29/2022  ?Medication Sig  ? aspirin 81 MG EC tablet Take 81 mg by mouth daily.  ? diclofenac (VOLTAREN) 75 MG EC tablet Take 1 tablet (75 mg total) by mouth 2 (two) times daily as needed. Do not take until after you have finished the prednisone  ? oxymetazoline (AFRIN NASAL SPRAY) 0.05 % nasal spray Place 1 spray into both nostrils 2 (two) times daily. Use in case of Nose bleed as needed  ? pioglitazone (ACTOS) 15 MG tablet Take 1 tablet (15 mg total) by mouth daily.  ? sitaGLIPtin (JANUVIA) 100 MG tablet TAKE 1 TABLET IN THE MORNING TO CONTROL DIABETES.  ? ?No facility-administered encounter medications on file as of 03/29/2022.  ? ? ?Allergies (verified) ?Metformin and related, Sulfa antibiotics, Tetanus toxoids, and Typhoid vaccines  ? ?History: ?Past Medical History:  ?Diagnosis Date  ? Abdominal pain, other specified site   ? Allergic rhinitis due to pollen   ? Benign paroxysmal positional vertigo   ? Disturbance of skin sensation   ? left great toe  ? Elevated prostate specific antigen (PSA)   ? Hematuria, unspecified   ? Hypertrophy of prostate without urinary obstruction and other lower urinary tract symptoms (LUTS)   ?  Impotence of organic origin   ? Left knee pain 05/28/2014  ? Macrocytosis   ? Other and unspecified hyperlipidemia   ? Other malaise and fatigue   ? Spermatocele   ? bilateral  ? Type II or unspecified type diabetes mellitus without mention of complication, uncontrolled   ? Unspecified essential hypertension   ? Unspecified glaucoma(365.9)   ? ?Past Surgical History:  ?Procedure Laterality Date  ? CATARACT EXTRACTION EXTRACAPSULAR  2008  ? bilateraly Dr. Katy Fitch  ? COLONOSCOPY  01/03/2002  ? normal Dr. Earlean Shawl  ? PROSTATE BIOPSY  1995  ? due to elevated PSA normal  ? TONSILLECTOMY    ? ?Family History  ?Problem Relation Age of  Onset  ? Cancer Father   ?     lung  ? Cancer Brother   ?     adrenal gland  ? Cancer Son   ?     liver  ? ?Social History  ? ?Socioeconomic History  ? Marital status: Married  ?  Spouse name: Not on file  ? Number of children: Not on file  ? Years of education: Not on file  ? Highest education level: Not on file  ?Occupational History  ? Occupation: retired, self employed  ?Tobacco Use  ? Smoking status: Former  ?  Years: 4.00  ?  Types: Cigarettes  ?  Quit date: 04/09/1948  ?  Years since quitting: 74.0  ? Smokeless tobacco: Never  ?Vaping Use  ? Vaping Use: Never used  ?Substance and Sexual Activity  ? Alcohol use: No  ? Drug use: No  ? Sexual activity: Yes  ?Other Topics Concern  ? Not on file  ?Social History Narrative  ? Lives at [facility]   ? Married   ? Living Will  ? ?Social Determinants of Health  ? ?Financial Resource Strain: Low Risk   ? Difficulty of Paying Living Expenses: Not hard at all  ?Food Insecurity: No Food Insecurity  ? Worried About Charity fundraiser in the Last Year: Never true  ? Ran Out of Food in the Last Year: Never true  ?Transportation Needs: No Transportation Needs  ? Lack of Transportation (Medical): No  ? Lack of Transportation (Non-Medical): No  ?Physical Activity: Insufficiently Active  ? Days of Exercise per Week: 7 days  ? Minutes of Exercise per Session: 20 min  ?Stress: No Stress Concern Present  ? Feeling of Stress : Not at all  ?Social Connections: Socially Integrated  ? Frequency of Communication with Friends and Family: More than three times a week  ? Frequency of Social Gatherings with Friends and Family: More than three times a week  ? Attends Religious Services: More than 4 times per year  ? Active Member of Clubs or Organizations: Yes  ? Attends Archivist Meetings: More than 4 times per year  ? Marital Status: Married  ? ? ?Tobacco Counseling ?Counseling given: Not Answered ? ? ?Clinical Intake: ? ?Pre-visit preparation completed: Yes ? ?Pain :  No/denies pain ? ?  ? ?BMI - recorded: 23.46 ?Nutritional Status: BMI of 19-24  Normal ?Nutritional Risks: None ?Diabetes: Yes ?CBG done?: No ?Did pt. bring in CBG monitor from home?: No ? ?How often do you need to have someone help you when you read instructions, pamphlets, or other written materials from your doctor or pharmacy?: 2 - Rarely ?What is the last grade level you completed in school?: 4 years college ? ?Diabetic?Yes ? ?Interpreter Needed?: No ? ?  ? ? ?  Activities of Daily Living ? ?  03/29/2022  ?  1:59 PM  ?In your present state of health, do you have any difficulty performing the following activities:  ?Hearing? 0  ?Vision? 0  ?Difficulty concentrating or making decisions? 0  ?Walking or climbing stairs? 0  ?Dressing or bathing? 0  ?Doing errands, shopping? 0  ?Preparing Food and eating ? N  ?Using the Toilet? N  ?In the past six months, have you accidently leaked urine? N  ?Do you have problems with loss of bowel control? N  ?Managing your Medications? N  ?Managing your Finances? N  ?Housekeeping or managing your Housekeeping? N  ? ? ?Patient Care Team: ?Virgie Dad, MD as PCP - General (Internal Medicine) ?Clent Jacks, MD as Consulting Physician (Ophthalmology) ?Lindwood Coke, MD as Consulting Physician (Dermatology) ?Thornell Sartorius, MD as Consulting Physician (Otolaryngology) ?Richmond Campbell, MD as Consulting Physician (Gastroenterology) ?[place] ?Wardell Honour, MD as Attending Physician (Family Medicine) ? ?Indicate any recent Medical Services you may have received from other than [facility] providers in the past year (date may be approximate). ? ?   ?Assessment:  ? This is a routine wellness examination for Ashraf. ? ?Hearing/Vision screen ?Hearing Screening - Comments:: Patient stated that he went last week 03/23/22, &quot[place]&quot; ?Vision Screening - Comments:: Patient stated that it has been less than a month. Dr. Tish Frederickson ? ?Dietary issues and exercise activities  discussed: ?Current Exercise Habits: The patient does not participate in regular exercise at present, Exercise limited by: orthopedic condition(s);Other - see comments (advanced age) ? ? Goals Addressed   ? ?  ?  ?  ?

## 2022-06-02 DIAGNOSIS — Z23 Encounter for immunization: Secondary | ICD-10-CM | POA: Diagnosis not present

## 2022-06-11 DIAGNOSIS — E1159 Type 2 diabetes mellitus with other circulatory complications: Secondary | ICD-10-CM | POA: Diagnosis not present

## 2022-06-11 DIAGNOSIS — L84 Corns and callosities: Secondary | ICD-10-CM | POA: Diagnosis not present

## 2022-06-11 DIAGNOSIS — L602 Onychogryphosis: Secondary | ICD-10-CM | POA: Diagnosis not present

## 2022-06-24 ENCOUNTER — Other Ambulatory Visit: Payer: Medicare Other

## 2022-06-24 DIAGNOSIS — E1140 Type 2 diabetes mellitus with diabetic neuropathy, unspecified: Secondary | ICD-10-CM | POA: Diagnosis not present

## 2022-06-25 LAB — CBC WITH DIFFERENTIAL/PLATELET
Absolute Monocytes: 754 cells/uL (ref 200–950)
Basophils Absolute: 57 cells/uL (ref 0–200)
Basophils Relative: 1.1 %
Eosinophils Absolute: 140 cells/uL (ref 15–500)
Eosinophils Relative: 2.7 %
HCT: 36.3 % — ABNORMAL LOW (ref 38.5–50.0)
Hemoglobin: 12.1 g/dL — ABNORMAL LOW (ref 13.2–17.1)
Lymphs Abs: 1342 cells/uL (ref 850–3900)
MCH: 33.1 pg — ABNORMAL HIGH (ref 27.0–33.0)
MCHC: 33.3 g/dL (ref 32.0–36.0)
MCV: 99.2 fL (ref 80.0–100.0)
MPV: 9.4 fL (ref 7.5–12.5)
Monocytes Relative: 14.5 %
Neutro Abs: 2907 cells/uL (ref 1500–7800)
Neutrophils Relative %: 55.9 %
Platelets: 289 10*3/uL (ref 140–400)
RBC: 3.66 10*6/uL — ABNORMAL LOW (ref 4.20–5.80)
RDW: 18.9 % — ABNORMAL HIGH (ref 11.0–15.0)
Total Lymphocyte: 25.8 %
WBC: 5.2 10*3/uL (ref 3.8–10.8)

## 2022-06-25 LAB — TSH: TSH: 1.34 mIU/L (ref 0.40–4.50)

## 2022-06-25 LAB — COMPLETE METABOLIC PANEL WITH GFR
AG Ratio: 1.7 (calc) (ref 1.0–2.5)
ALT: 8 U/L — ABNORMAL LOW (ref 9–46)
AST: 14 U/L (ref 10–35)
Albumin: 4 g/dL (ref 3.6–5.1)
Alkaline phosphatase (APISO): 65 U/L (ref 35–144)
BUN: 24 mg/dL (ref 7–25)
CO2: 30 mmol/L (ref 20–32)
Calcium: 8.6 mg/dL (ref 8.6–10.3)
Chloride: 104 mmol/L (ref 98–110)
Creat: 0.71 mg/dL (ref 0.70–1.22)
Globulin: 2.3 g/dL (calc) (ref 1.9–3.7)
Glucose, Bld: 122 mg/dL — ABNORMAL HIGH (ref 65–99)
Potassium: 4.1 mmol/L (ref 3.5–5.3)
Sodium: 138 mmol/L (ref 135–146)
Total Bilirubin: 1.1 mg/dL (ref 0.2–1.2)
Total Protein: 6.3 g/dL (ref 6.1–8.1)
eGFR: 84 mL/min/{1.73_m2} (ref >=60)

## 2022-06-25 LAB — HEMOGLOBIN A1C
Hgb A1c MFr Bld: 6.7 % of total Hgb — ABNORMAL HIGH (ref <5.7)
Mean Plasma Glucose: 146 mg/dL
eAG (mmol/L): 8.1 mmol/L

## 2022-06-30 ENCOUNTER — Encounter: Payer: Self-pay | Admitting: Internal Medicine

## 2022-06-30 ENCOUNTER — Non-Acute Institutional Stay: Payer: Medicare Other | Admitting: Internal Medicine

## 2022-06-30 VITALS — BP 103/57 | HR 74 | Temp 97.5°F | Ht 65.0 in | Wt 142.7 lb

## 2022-06-30 DIAGNOSIS — G8929 Other chronic pain: Secondary | ICD-10-CM | POA: Diagnosis not present

## 2022-06-30 DIAGNOSIS — Z636 Dependent relative needing care at home: Secondary | ICD-10-CM | POA: Diagnosis not present

## 2022-06-30 DIAGNOSIS — M25512 Pain in left shoulder: Secondary | ICD-10-CM | POA: Diagnosis not present

## 2022-06-30 DIAGNOSIS — M5412 Radiculopathy, cervical region: Secondary | ICD-10-CM | POA: Diagnosis not present

## 2022-06-30 DIAGNOSIS — E1140 Type 2 diabetes mellitus with diabetic neuropathy, unspecified: Secondary | ICD-10-CM | POA: Diagnosis not present

## 2022-06-30 DIAGNOSIS — R200 Anesthesia of skin: Secondary | ICD-10-CM

## 2022-06-30 NOTE — Progress Notes (Unsigned)
Location:  [place] of Service:  Clinic (12)  Provider:   Code Status: *** Goals of Care:     03/29/2022   11:48 AM  Advanced Directives  Does Patient Have a Medical Advance Directive? Yes  Type of Paramedic of [place];Living will  Does patient want to make changes to medical advance directive? No - Patient declined  Copy of [place] in Chart? Yes - validated most recent copy scanned in chart (See row information)     Chief Complaint  Patient presents with   Medical Management of Chronic Issues    Patient returns to the clinic for his 4 month follow up.    Quality Metric Gaps    Discuss need for urine microalbumin, Covid vaccine and eye exam.     HPI: Patient is a 87 y.o. male seen today for medical management of chronic diseases.     Past Medical History:  Diagnosis Date   Abdominal pain, other specified site    Allergic rhinitis due to pollen    Benign paroxysmal positional vertigo    Disturbance of skin sensation    left great toe   Elevated prostate specific antigen (PSA)    Hematuria, unspecified    Hypertrophy of prostate without urinary obstruction and other lower urinary tract symptoms (LUTS)    Impotence of organic origin    Left knee pain 05/28/2014   Macrocytosis    Other and unspecified hyperlipidemia    Other malaise and fatigue    Spermatocele    bilateral   Type II or unspecified type diabetes mellitus without mention of complication, uncontrolled    Unspecified essential hypertension    Unspecified glaucoma(365.9)     Past Surgical History:  Procedure Laterality Date   CATARACT EXTRACTION EXTRACAPSULAR  2008   bilateraly Dr. Katy Fitch   COLONOSCOPY  01/03/2002   normal Dr. Earlean Shawl   PROSTATE BIOPSY  1995   due to elevated PSA normal   TONSILLECTOMY      Allergies  Allergen Reactions   Metformin And Related Other (See Comments)    unknown   Sulfa Antibiotics  Swelling   Tetanus Toxoids Other (See Comments)    unknown   Typhoid Vaccines Other (See Comments)    unknown    Outpatient Encounter Medications as of 06/30/2022  Medication Sig   aspirin 81 MG EC tablet Take 81 mg by mouth daily.   diclofenac (VOLTAREN) 75 MG EC tablet Take 1 tablet (75 mg total) by mouth 2 (two) times daily as needed. Do not take until after you have finished the prednisone   oxymetazoline (AFRIN NASAL SPRAY) 0.05 % nasal spray Place 1 spray into both nostrils 2 (two) times daily. Use in case of Nose bleed as needed   pioglitazone (ACTOS) 15 MG tablet Take 1 tablet (15 mg total) by mouth daily.   sitaGLIPtin (JANUVIA) 100 MG tablet TAKE 1 TABLET IN THE MORNING TO CONTROL DIABETES.   No facility-administered encounter medications on file as of 06/30/2022.    Review of Systems:  Review of Systems  Health Maintenance  Topic Date Due   URINE MICROALBUMIN  05/26/2019   COVID-19 Vaccine (2 - Pfizer series) 01/09/2022   OPHTHALMOLOGY EXAM  03/19/2022   INFLUENZA VACCINE  07/20/2022   HEMOGLOBIN A1C  12/25/2022   FOOT EXAM  03/04/2023   Pneumonia Vaccine 69+ Years old  Completed   HPV VACCINES  Aged Out   TETANUS/TDAP  Discontinued  Zoster Vaccines- Shingrix  Discontinued    Physical Exam: Vitals:   06/30/22 1500  BP: (!) 103/57  Pulse: 74  Temp: (!) 97.5 F (36.4 C)  SpO2: 95%  Weight: 142 lb 11.2 oz (64.7 kg)  Height: '5\' 5"'$  (1.651 m)   Body mass index is 23.75 kg/m. Physical Exam  Labs reviewed: Basic Metabolic Panel: Recent Labs    09/30/21 0908 02/24/22 1502 06/24/22 0810  NA 140 140 138  K 4.1 4.4 4.1  CL 106 104 104  CO2 '27 30 30  '$ GLUCOSE 134* 156* 122*  BUN '20 22 24  '$ CREATININE 0.70 0.73 0.71  CALCIUM 8.8 8.7 8.6  TSH  --   --  1.34   Liver Function Tests: Recent Labs    09/30/21 0908 02/24/22 1502 06/24/22 0810  AST '16 15 14  '$ ALT 10 12 8*  BILITOT 0.7 1.0 1.1  PROT 6.6 6.4 6.3   No results for input(s): "LIPASE",  "AMYLASE" in the last 8760 hours. No results for input(s): "AMMONIA" in the last 8760 hours. CBC: Recent Labs    09/30/21 0908 02/24/22 1502 06/24/22 0810  WBC 5.2 7.2 5.2  NEUTROABS 2,839 3,888 2,907  HGB 12.9* 13.5 12.1*  HCT 38.8 39.9 36.3*  MCV 101.6* 99.0 99.2  PLT 311 328 289   Lipid Panel: Recent Labs    09/30/21 0908  CHOL 141  HDL 65  LDLCALC 60  TRIG 84  CHOLHDL 2.2   Lab Results  Component Value Date   HGBA1C 6.7 (H) 06/24/2022    Procedures since last visit: No results found.  Assessment/Plan There are no diagnoses linked to this encounter.   Labs/tests ordered:  * No order type specified * Next appt:  Visit date not found

## 2022-08-20 DIAGNOSIS — L84 Corns and callosities: Secondary | ICD-10-CM | POA: Diagnosis not present

## 2022-08-20 DIAGNOSIS — E1159 Type 2 diabetes mellitus with other circulatory complications: Secondary | ICD-10-CM | POA: Diagnosis not present

## 2022-08-20 DIAGNOSIS — L602 Onychogryphosis: Secondary | ICD-10-CM | POA: Diagnosis not present

## 2022-08-25 ENCOUNTER — Non-Acute Institutional Stay: Payer: Medicare Other | Admitting: Internal Medicine

## 2022-08-25 ENCOUNTER — Encounter: Payer: Self-pay | Admitting: Internal Medicine

## 2022-08-25 VITALS — BP 126/66 | HR 75 | Temp 97.5°F | Ht 65.0 in | Wt 145.0 lb

## 2022-08-25 DIAGNOSIS — R5383 Other fatigue: Secondary | ICD-10-CM | POA: Diagnosis not present

## 2022-08-25 DIAGNOSIS — F32A Depression, unspecified: Secondary | ICD-10-CM

## 2022-08-25 DIAGNOSIS — E1140 Type 2 diabetes mellitus with diabetic neuropathy, unspecified: Secondary | ICD-10-CM

## 2022-08-26 ENCOUNTER — Other Ambulatory Visit: Payer: Medicare Other

## 2022-08-26 DIAGNOSIS — R5383 Other fatigue: Secondary | ICD-10-CM | POA: Diagnosis not present

## 2022-08-26 DIAGNOSIS — F32A Depression, unspecified: Secondary | ICD-10-CM | POA: Diagnosis not present

## 2022-08-26 DIAGNOSIS — E1140 Type 2 diabetes mellitus with diabetic neuropathy, unspecified: Secondary | ICD-10-CM | POA: Diagnosis not present

## 2022-08-26 LAB — CBC WITH DIFFERENTIAL/PLATELET
Absolute Monocytes: 885 cells/uL (ref 200–950)
Basophils Absolute: 79 cells/uL (ref 0–200)
Basophils Relative: 1.3 %
Eosinophils Absolute: 159 cells/uL (ref 15–500)
Eosinophils Relative: 2.6 %
HCT: 36.6 % — ABNORMAL LOW (ref 38.5–50.0)
Hemoglobin: 12.3 g/dL — ABNORMAL LOW (ref 13.2–17.1)
Lymphs Abs: 1781 cells/uL (ref 850–3900)
MCH: 33.1 pg — ABNORMAL HIGH (ref 27.0–33.0)
MCHC: 33.6 g/dL (ref 32.0–36.0)
MCV: 98.4 fL (ref 80.0–100.0)
MPV: 9.6 fL (ref 7.5–12.5)
Monocytes Relative: 14.5 %
Neutro Abs: 3196 cells/uL (ref 1500–7800)
Neutrophils Relative %: 52.4 %
Platelets: 283 10*3/uL (ref 140–400)
RBC: 3.72 10*6/uL — ABNORMAL LOW (ref 4.20–5.80)
RDW: 18.8 % — ABNORMAL HIGH (ref 11.0–15.0)
Total Lymphocyte: 29.2 %
WBC: 6.1 10*3/uL (ref 3.8–10.8)

## 2022-08-26 LAB — COMPLETE METABOLIC PANEL WITH GFR
AG Ratio: 1.7 (calc) (ref 1.0–2.5)
ALT: 9 U/L (ref 9–46)
AST: 14 U/L (ref 10–35)
Albumin: 4 g/dL (ref 3.6–5.1)
Alkaline phosphatase (APISO): 58 U/L (ref 35–144)
BUN: 23 mg/dL (ref 7–25)
CO2: 33 mmol/L — ABNORMAL HIGH (ref 20–32)
Calcium: 8.8 mg/dL (ref 8.6–10.3)
Chloride: 104 mmol/L (ref 98–110)
Creat: 0.77 mg/dL (ref 0.70–1.22)
Globulin: 2.3 g/dL (calc) (ref 1.9–3.7)
Glucose, Bld: 104 mg/dL — ABNORMAL HIGH (ref 65–99)
Potassium: 4.2 mmol/L (ref 3.5–5.3)
Sodium: 140 mmol/L (ref 135–146)
Total Bilirubin: 1.1 mg/dL (ref 0.2–1.2)
Total Protein: 6.3 g/dL (ref 6.1–8.1)
eGFR: 82 mL/min/{1.73_m2} (ref >=60)

## 2022-08-27 NOTE — Progress Notes (Signed)
Location: [facility] (12)  Provider:   Code Status:  Goals of Care:     03/29/2022   11:48 AM  Advanced Directives  Does Patient Have a Medical Advance Directive? Yes  Type of Paramedic of [place];Living will  Does patient want to make changes to medical advance directive? No - Patient declined  Copy of [place] in Chart? Yes - validated most recent copy scanned in chart (See row information)     Chief Complaint  Patient presents with   Acute Visit    Complains of Fatigue. Facility COVID testing done and was NEGATIVE.     HPI: Patient is a 88 y.o. male seen today for an acute visit for I do not feel good  Patient has h/o Diabetes mellitus, LE edema, Right Frozen Shoulder,And B12 def Post Prandial Dizziness Echo in the past is Normal EF  Cervical Radiculopathy with Left Arm Numbness  Patient's wife who has progressive dementia is now in healthcare.  Patient was a full caregiver for her and has now made a decision for her to stay in SNF Today patient said that he feels down and does not feel good.  He denied any fever cough chest pain dysuria.  He is eating well. Complains of difficulty falling asleep. COVID test done was negative  Past Medical History:  Diagnosis Date   Abdominal pain, other specified site    Allergic rhinitis due to pollen    Benign paroxysmal positional vertigo    Disturbance of skin sensation    left great toe   Elevated prostate specific antigen (PSA)    Hematuria, unspecified    Hypertrophy of prostate without urinary obstruction and other lower urinary tract symptoms (LUTS)    Impotence of organic origin    Left knee pain 05/28/2014   Macrocytosis    Other and unspecified hyperlipidemia    Other malaise and fatigue    Spermatocele    bilateral   Type II or unspecified type diabetes mellitus without mention of complication, uncontrolled    Unspecified essential  hypertension    Unspecified glaucoma(365.9)     Past Surgical History:  Procedure Laterality Date   CATARACT EXTRACTION EXTRACAPSULAR  2008   bilateraly Dr. Katy Fitch   COLONOSCOPY  01/03/2002   normal Dr. Earlean Shawl   PROSTATE BIOPSY  1995   due to elevated PSA normal   TONSILLECTOMY      Allergies  Allergen Reactions   Metformin And Related Other (See Comments)    unknown   Sulfa Antibiotics Swelling   Tetanus Toxoids Other (See Comments)    unknown   Typhoid Vaccines Other (See Comments)    unknown    Outpatient Encounter Medications as of 08/25/2022  Medication Sig   aspirin 81 MG EC tablet Take 81 mg by mouth daily.   diclofenac (VOLTAREN) 75 MG EC tablet Take 1 tablet (75 mg total) by mouth 2 (two) times daily as needed. Do not take until after you have finished the prednisone   oxymetazoline (AFRIN NASAL SPRAY) 0.05 % nasal spray Place 1 spray into both nostrils 2 (two) times daily. Use in case of Nose bleed as needed   pioglitazone (ACTOS) 15 MG tablet Take 1 tablet (15 mg total) by mouth daily.   sitaGLIPtin (JANUVIA) 100 MG tablet TAKE 1 TABLET IN THE MORNING TO CONTROL DIABETES.   No facility-administered encounter medications on file as of 08/25/2022.  Review of Systems:  Review of Systems  Constitutional:  Negative for activity change, appetite change and unexpected weight change.  HENT: Negative.    Respiratory:  Negative for cough and shortness of breath.   Cardiovascular:  Negative for leg swelling.  Gastrointestinal:  Negative for constipation.  Genitourinary:  Negative for frequency.  Musculoskeletal:  Negative for arthralgias, gait problem and myalgias.  Skin: Negative.  Negative for rash.  Neurological:  Negative for dizziness and weakness.  Psychiatric/Behavioral:  Positive for dysphoric mood. Negative for confusion and sleep disturbance.   All other systems reviewed and are negative.   Health Maintenance  Topic Date Due   URINE MICROALBUMIN  05/26/2019    COVID-19 Vaccine (2 - Pfizer series) 01/09/2022   INFLUENZA VACCINE  07/20/2022   HEMOGLOBIN A1C  12/25/2022   FOOT EXAM  03/04/2023   OPHTHALMOLOGY EXAM  03/17/2023   Pneumonia Vaccine 81+ Years old  Completed   HPV VACCINES  Aged Out   TETANUS/TDAP  Discontinued   Zoster Vaccines- Shingrix  Discontinued    Physical Exam: Vitals:   08/25/22 1541  BP: 126/66  Pulse: 75  Temp: (!) 97.5 F (36.4 C)  SpO2: 98%  Weight: 145 lb (65.8 kg)  Height: '5\' 5"'$  (1.651 m)   Body mass index is 24.13 kg/m. Physical Exam Vitals reviewed.  Constitutional:      Appearance: Normal appearance.  HENT:     Head: Normocephalic.     Nose: Nose normal.     Mouth/Throat:     Mouth: Mucous membranes are moist.     Pharynx: Oropharynx is clear.  Eyes:     Pupils: Pupils are equal, round, and reactive to light.  Cardiovascular:     Rate and Rhythm: Normal rate and regular rhythm.     Pulses: Normal pulses.     Heart sounds: No murmur heard. Pulmonary:     Effort: Pulmonary effort is normal. No respiratory distress.     Breath sounds: Normal breath sounds. No rales.  Abdominal:     General: Abdomen is flat. Bowel sounds are normal.     Palpations: Abdomen is soft.  Musculoskeletal:        General: No swelling.     Cervical back: Neck supple.  Skin:    General: Skin is warm.  Neurological:     General: No focal deficit present.     Mental Status: He is alert and oriented to person, place, and time.  Psychiatric:        Mood and Affect: Mood normal.        Thought Content: Thought content normal.     Labs reviewed: Basic Metabolic Panel: Recent Labs    02/24/22 1502 06/24/22 0810 08/26/22 0800  NA 140 138 140  K 4.4 4.1 4.2  CL 104 104 104  CO2 30 30 33*  GLUCOSE 156* 122* 104*  BUN '22 24 23  '$ CREATININE 0.73 0.71 0.77  CALCIUM 8.7 8.6 8.8  TSH  --  1.34  --    Liver Function Tests: Recent Labs    02/24/22 1502 06/24/22 0810 08/26/22 0800  AST '15 14 14  '$ ALT 12 8* 9   BILITOT 1.0 1.1 1.1  PROT 6.4 6.3 6.3   No results for input(s): "LIPASE", "AMYLASE" in the last 8760 hours. No results for input(s): "AMMONIA" in the last 8760 hours. CBC: Recent Labs    02/24/22 1502 06/24/22 0810 08/26/22 0800  WBC 7.2 5.2 6.1  NEUTROABS 3,888 2,907 3,196  HGB 13.5 12.1* 12.3*  HCT 39.9 36.3* 36.6*  MCV 99.0 99.2 98.4  PLT 328 289 283   Lipid Panel: Recent Labs    09/30/21 0908  CHOL 141  HDL 65  LDLCALC 60  TRIG 84  CHOLHDL 2.2   Lab Results  Component Value Date   HGBA1C 6.7 (H) 06/24/2022    Procedures since last visit: No results found.  Assessment/Plan 1. Fatigue due to depression Possibly due to depression We discussed how he has more time now that he is not providing care for his wife He did acknowledge that he misses her. He does not want to try any antidepressant at this time I asked him to try to join some activities around friends home as he was unable to do them before. He will come and see me in few weeks   - COMPLETE METABOLIC PANEL WITH GFR; Future - CBC with Differential/Platelet; Future  2. Type 2 diabetes mellitus with diabetic neuropathy, without long-term current use of insulin (HCC)  - COMPLETE METABOLIC PANEL WITH GFR; Future - CBC with Differential/Platelet; Future    Labs/tests ordered:  * No order type specified * Next appt:  08/26/2022

## 2022-09-16 DIAGNOSIS — H401131 Primary open-angle glaucoma, bilateral, mild stage: Secondary | ICD-10-CM | POA: Diagnosis not present

## 2022-10-06 ENCOUNTER — Encounter: Payer: Self-pay | Admitting: Internal Medicine

## 2022-10-06 ENCOUNTER — Non-Acute Institutional Stay: Payer: Medicare Other | Admitting: Internal Medicine

## 2022-10-06 VITALS — BP 114/66 | HR 85 | Temp 97.7°F | Resp 18 | Ht 65.0 in | Wt 146.2 lb

## 2022-10-06 DIAGNOSIS — M5412 Radiculopathy, cervical region: Secondary | ICD-10-CM

## 2022-10-06 DIAGNOSIS — R5383 Other fatigue: Secondary | ICD-10-CM

## 2022-10-06 DIAGNOSIS — F32A Depression, unspecified: Secondary | ICD-10-CM

## 2022-10-06 DIAGNOSIS — E1140 Type 2 diabetes mellitus with diabetic neuropathy, unspecified: Secondary | ICD-10-CM

## 2022-10-08 NOTE — Progress Notes (Signed)
Location: [place] of Service:  Clinic (12)  Provider:   Code Status:  Goals of Care:     10/06/2022    3:59 PM  Advanced Directives  Does Patient Have a Medical Advance Directive? Yes  Type of Paramedic of [place];Living will;Out of facility DNR (pink MOST or yellow form)  Copy of [place] in Chart? Yes - validated most recent copy scanned in chart (See row information)     Chief Complaint  Patient presents with   Medical Management of Chronic Issues    4 Week Follow up   Quality Metric Gaps    To discuss need for Covid and Flu or postpone if patient refuses.     HPI: Patient is a 88 y.o. male seen today for an acute visit for Follow up of his Depression Daughter here with him  Patient has h/o Diabetes mellitus, LE edema, Right Frozen Shoulder,And B12 def Post Prandial Dizziness Echo in the past is Normal EF  Cervical Radiculopathy with Left Arm Numbness   Patient's wife who has progressive dementia is now in healthcare.  Patient was a full caregiver for her and has now made a decision for her to stay in SNF  He was feeling depressed on his last visit. Today patient states that he feels better his daughter was here too With reporting that he is much better mentally Weight is stable sleeping better at night.  Is able to socialize with other residents and friends home. Past Medical History:  Diagnosis Date   Abdominal pain, other specified site    Allergic rhinitis due to pollen    Benign paroxysmal positional vertigo    Disturbance of skin sensation    left great toe   Elevated prostate specific antigen (PSA)    Hematuria, unspecified    Hypertrophy of prostate without urinary obstruction and other lower urinary tract symptoms (LUTS)    Impotence of organic origin    Left knee pain 05/28/2014   Macrocytosis    Other and unspecified hyperlipidemia    Other malaise and fatigue    Spermatocele     bilateral   Type II or unspecified type diabetes mellitus without mention of complication, uncontrolled    Unspecified essential hypertension    Unspecified glaucoma(365.9)     Past Surgical History:  Procedure Laterality Date   CATARACT EXTRACTION EXTRACAPSULAR  2008   bilateraly Dr. Katy Fitch   COLONOSCOPY  01/03/2002   normal Dr. Earlean Shawl   PROSTATE BIOPSY  1995   due to elevated PSA normal   TONSILLECTOMY      Allergies  Allergen Reactions   Metformin And Related Other (See Comments)    unknown   Sulfa Antibiotics Swelling   Tetanus Toxoids Other (See Comments)    unknown   Typhoid Vaccines Other (See Comments)    unknown    Outpatient Encounter Medications as of 10/06/2022  Medication Sig   aspirin 81 MG EC tablet Take 81 mg by mouth daily.   diclofenac (VOLTAREN) 75 MG EC tablet Take 1 tablet (75 mg total) by mouth 2 (two) times daily as needed. Do not take until after you have finished the prednisone   oxymetazoline (AFRIN NASAL SPRAY) 0.05 % nasal spray Place 1 spray into both nostrils 2 (two) times daily. Use in case of Nose bleed as needed   pioglitazone (ACTOS) 15 MG tablet Take 1 tablet (15 mg total) by mouth daily.   sitaGLIPtin (JANUVIA)  100 MG tablet TAKE 1 TABLET IN THE MORNING TO CONTROL DIABETES.   No facility-administered encounter medications on file as of 10/06/2022.    Review of Systems:  Review of Systems  Constitutional:  Negative for activity change, appetite change and unexpected weight change.  HENT: Negative.    Respiratory:  Negative for cough and shortness of breath.   Cardiovascular:  Negative for leg swelling.  Gastrointestinal:  Negative for constipation.  Genitourinary:  Negative for frequency.  Musculoskeletal:  Negative for arthralgias, gait problem and myalgias.  Skin: Negative.  Negative for rash.  Neurological:  Negative for dizziness and weakness.  Psychiatric/Behavioral:  Negative for confusion and sleep disturbance.   All other  systems reviewed and are negative.   Health Maintenance  Topic Date Due   COVID-19 Vaccine (2 - Pfizer series) 01/09/2022   INFLUENZA VACCINE  07/20/2022   HEMOGLOBIN A1C  12/25/2022   FOOT EXAM  03/04/2023   OPHTHALMOLOGY EXAM  03/17/2023   Pneumonia Vaccine 73+ Years old  Completed   HPV VACCINES  Aged Out   TETANUS/TDAP  Discontinued   Zoster Vaccines- Shingrix  Discontinued    Physical Exam: Vitals:   10/06/22 1555  BP: 114/66  Pulse: 85  Resp: 18  Temp: 97.7 F (36.5 C)  TempSrc: Temporal  SpO2: 97%  Weight: 146 lb 3.2 oz (66.3 kg)  Height: '5\' 5"'$  (1.651 m)   Body mass index is 24.33 kg/m. Physical Exam Vitals reviewed.  Constitutional:      Appearance: Normal appearance.  HENT:     Head: Normocephalic.     Nose: Nose normal.     Mouth/Throat:     Mouth: Mucous membranes are moist.     Pharynx: Oropharynx is clear.  Eyes:     Pupils: Pupils are equal, round, and reactive to light.  Cardiovascular:     Rate and Rhythm: Normal rate and regular rhythm.     Pulses: Normal pulses.     Heart sounds: No murmur heard. Pulmonary:     Effort: Pulmonary effort is normal. No respiratory distress.     Breath sounds: Normal breath sounds. No rales.  Abdominal:     General: Abdomen is flat. Bowel sounds are normal.     Palpations: Abdomen is soft.  Musculoskeletal:        General: No swelling.     Cervical back: Neck supple.  Skin:    General: Skin is warm.  Neurological:     General: No focal deficit present.     Mental Status: He is alert and oriented to person, place, and time.  Psychiatric:        Mood and Affect: Mood normal.        Thought Content: Thought content normal.     Labs reviewed: Basic Metabolic Panel: Recent Labs    02/24/22 1502 06/24/22 0810 08/26/22 0800  NA 140 138 140  K 4.4 4.1 4.2  CL 104 104 104  CO2 30 30 33*  GLUCOSE 156* 122* 104*  BUN '22 24 23  '$ CREATININE 0.73 0.71 0.77  CALCIUM 8.7 8.6 8.8  TSH  --  1.34  --     Liver Function Tests: Recent Labs    02/24/22 1502 06/24/22 0810 08/26/22 0800  AST '15 14 14  '$ ALT 12 8* 9  BILITOT 1.0 1.1 1.1  PROT 6.4 6.3 6.3   No results for input(s): "LIPASE", "AMYLASE" in the last 8760 hours. No results for input(s): "AMMONIA" in the last 8760 hours.  CBC: Recent Labs    02/24/22 1502 06/24/22 0810 08/26/22 0800  WBC 7.2 5.2 6.1  NEUTROABS 3,888 2,907 3,196  HGB 13.5 12.1* 12.3*  HCT 39.9 36.3* 36.6*  MCV 99.0 99.2 98.4  PLT 328 289 283   Lipid Panel: No results for input(s): "CHOL", "HDL", "LDLCALC", "TRIG", "CHOLHDL", "LDLDIRECT" in the last 8760 hours. Lab Results  Component Value Date   HGBA1C 6.7 (H) 06/24/2022    Procedures since last visit: No results found.  Assessment/Plan 1. Fatigue due to depression At this time patient is refusing to take any antidepressant.  He feels much better we will continue to follow  2. Type 2 diabetes mellitus with diabetic neuropathy, without long-term current use of insulin (HCC) Continue on Januvia and Actos  3. Cervical radiculopathy     Labs/tests ordered:  * No order type specified * Next appt:  01/31/2023

## 2022-10-15 DIAGNOSIS — R208 Other disturbances of skin sensation: Secondary | ICD-10-CM | POA: Diagnosis not present

## 2022-10-15 DIAGNOSIS — L989 Disorder of the skin and subcutaneous tissue, unspecified: Secondary | ICD-10-CM | POA: Diagnosis not present

## 2022-10-26 DIAGNOSIS — Z23 Encounter for immunization: Secondary | ICD-10-CM | POA: Diagnosis not present

## 2022-11-24 ENCOUNTER — Encounter: Payer: Self-pay | Admitting: Internal Medicine

## 2022-11-24 ENCOUNTER — Non-Acute Institutional Stay: Payer: Medicare Other | Admitting: Internal Medicine

## 2022-11-24 VITALS — BP 112/64 | HR 106 | Temp 98.6°F | Resp 17 | Ht 64.0 in | Wt 144.0 lb

## 2022-11-24 DIAGNOSIS — E1140 Type 2 diabetes mellitus with diabetic neuropathy, unspecified: Secondary | ICD-10-CM | POA: Diagnosis not present

## 2022-11-24 DIAGNOSIS — M5412 Radiculopathy, cervical region: Secondary | ICD-10-CM | POA: Diagnosis not present

## 2022-11-24 DIAGNOSIS — M25561 Pain in right knee: Secondary | ICD-10-CM | POA: Diagnosis not present

## 2022-11-24 MED ORDER — MELOXICAM 15 MG PO TABS: 15.0000 mg | ORAL_TABLET | Freq: Every day | ORAL | 0 refills | Status: DC

## 2022-11-24 MED ORDER — OMEPRAZOLE 40 MG PO CPDR: 40.0000 mg | DELAYED_RELEASE_CAPSULE | Freq: Every day | ORAL | 0 refills | Status: DC

## 2022-11-24 NOTE — Progress Notes (Signed)
Location: [facility] (12)  Provider:   Code Status:  Goals of Care:     11/24/2022    9:00 AM  Advanced Directives  Does Patient Have a Medical Advance Directive? Yes  Type of Paramedic of [place];Living will;Out of facility DNR (pink MOST or yellow form)  Copy of [place] in Chart? Yes - validated most recent copy scanned in chart (See row information)     Chief Complaint  Patient presents with   Acute Visit    Right knee pain    HPI: Patient is a 88 y.o. [name] seen today for an acute visit for Right Knee pain  Lives in [place]  Noticed few weeks ago No Falls Just feel pain in the knee Limping when walks. But does not feel like it is locking or giving way Using the walker for safety Using Lidocaine patches which helps  No Swelling or redness   Has h/o  has h/o Diabetes mellitus, LE edema, Right Frozen Shoulder,And B12 def Post Prandial Dizziness Echo in the past is Normal EF  Cervical Radiculopathy with Left Arm Numbness     Past Medical History:  Diagnosis Date   Abdominal pain, other specified site    Allergic rhinitis due to pollen    Benign paroxysmal positional vertigo    Disturbance of skin sensation    left great toe   Elevated prostate specific antigen (PSA)    Hematuria, unspecified    Hypertrophy of prostate without urinary obstruction and other lower urinary tract symptoms (LUTS)    Impotence of organic origin    Left knee pain 05/28/2014   Macrocytosis    Other and unspecified hyperlipidemia    Other malaise and fatigue    Spermatocele    bilateral   Type II or unspecified type diabetes mellitus without mention of complication, uncontrolled    Unspecified essential hypertension    Unspecified glaucoma(365.9)     Past Surgical History:  Procedure Laterality Date   CATARACT EXTRACTION EXTRACAPSULAR  2008   bilateraly Dr. Katy Fitch   COLONOSCOPY  01/03/2002   normal  Dr. Earlean Shawl   PROSTATE BIOPSY  1995   due to elevated PSA normal   TONSILLECTOMY      Allergies  Allergen Reactions   Metformin And Related Other (See Comments)    unknown   Sulfa Antibiotics Swelling   Tetanus Toxoids Other (See Comments)    unknown   Typhoid Vaccines Other (See Comments)    unknown    Outpatient Encounter Medications as of 11/24/2022  Medication Sig   aspirin 81 MG EC tablet Take 81 mg by mouth daily.   meloxicam (MOBIC) 15 MG tablet Take 1 tablet (15 mg total) by mouth daily.   omeprazole (PRILOSEC) 40 MG capsule Take 1 capsule (40 mg total) by mouth daily for 14 days.   pioglitazone (ACTOS) 15 MG tablet Take 1 tablet (15 mg total) by mouth daily.   sitaGLIPtin (JANUVIA) 100 MG tablet TAKE 1 TABLET IN THE MORNING TO CONTROL DIABETES.   [DISCONTINUED] diclofenac (VOLTAREN) 75 MG EC tablet Take 1 tablet (75 mg total) by mouth 2 (two) times daily as needed. Do not take until after you have finished the prednisone   [DISCONTINUED] oxymetazoline (AFRIN NASAL SPRAY) 0.05 % nasal spray Place 1 spray into both nostrils 2 (two) times daily. Use in case of Nose bleed as needed   No facility-administered encounter medications on file  as of 11/24/2022.    Review of Systems:  Review of Systems  Constitutional:  Negative for activity change, appetite change and unexpected weight change.  HENT: Negative.    Respiratory:  Negative for cough and shortness of breath.   Cardiovascular:  Negative for leg swelling.  Gastrointestinal:  Negative for constipation.  Genitourinary:  Negative for frequency.  Musculoskeletal:  Positive for gait problem and myalgias. Negative for arthralgias.  Skin: Negative.  Negative for rash.  Neurological:  Negative for dizziness and weakness.  Psychiatric/Behavioral:  Negative for confusion and sleep disturbance.   All other systems reviewed and are negative.   Health Maintenance  Topic Date Due   DTaP/Tdap/Td (1 - Tdap) Never done    INFLUENZA VACCINE  07/20/2022   COVID-19 Vaccine (6 - 2023-24 season) 08/20/2022   HEMOGLOBIN A1C  12/25/2022   FOOT EXAM  03/04/2023   OPHTHALMOLOGY EXAM  03/17/2023   Medicare Annual Wellness (AWV)  03/30/2023   Pneumonia Vaccine 12+ Years old  Completed   HPV VACCINES  Aged Out   Zoster Vaccines- Shingrix  Discontinued    Physical Exam: Vitals:   11/24/22 0858  BP: 112/64  Pulse: (!) 106  Resp: 17  Temp: 98.6 F (37 C)  TempSrc: Temporal  SpO2: 94%  Weight: 144 lb (65.3 kg)  Height: '5\' 4"'$  (1.626 m)   Body mass index is 24.72 kg/m. Physical Exam Vitals reviewed.  Constitutional:      Appearance: Normal appearance.  HENT:     Head: Normocephalic.     Nose: Nose normal.     Mouth/Throat:     Mouth: Mucous membranes are moist.     Pharynx: Oropharynx is clear.  Eyes:     Pupils: Pupils are equal, round, and reactive to light.  Cardiovascular:     Rate and Rhythm: Normal rate and regular rhythm.     Pulses: Normal pulses.     Heart sounds: No murmur heard. Pulmonary:     Effort: Pulmonary effort is normal. No respiratory distress.     Breath sounds: Normal breath sounds. No rales.  Abdominal:     General: Abdomen is flat. Bowel sounds are normal.     Palpations: Abdomen is soft.  Musculoskeletal:        General: No swelling.     Cervical back: Neck supple.     Comments: Right Knee  Not tender with Passive or Active movement No Redness or swelling Patella Normal No Signs of any bursitis When walks has slight limp as he says it hurts  Skin:    General: Skin is warm.  Neurological:     General: No focal deficit present.     Mental Status: He is alert and oriented to person, place, and time.  Psychiatric:        Mood and Affect: Mood normal.        Thought Content: Thought content normal.     Labs reviewed: Basic Metabolic Panel: Recent Labs    02/24/22 1502 06/24/22 0810 08/26/22 0800  NA 140 138 140  K 4.4 4.1 4.2  CL 104 104 104  CO2 30 30  33*  GLUCOSE 156* 122* 104*  BUN '22 24 23  '$ CREATININE 0.73 0.71 0.77  CALCIUM 8.7 8.6 8.8  TSH  --  1.34  --    Liver Function Tests: Recent Labs    02/24/22 1502 06/24/22 0810 08/26/22 0800  AST '15 14 14  '$ ALT 12 8* 9  BILITOT 1.0 1.1 1.1  PROT 6.4 6.3 6.3   No results for input(s): "LIPASE", "AMYLASE" in the last 8760 hours. No results for input(s): "AMMONIA" in the last 8760 hours. CBC: Recent Labs    02/24/22 1502 06/24/22 0810 08/26/22 0800  WBC 7.2 5.2 6.1  NEUTROABS 3,888 2,907 3,196  HGB 13.5 12.1* 12.3*  HCT 39.9 36.3* 36.6*  MCV 99.0 99.2 98.4  PLT 328 289 283   Lipid Panel: No results for input(s): "CHOL", "HDL", "LDLCALC", "TRIG", "CHOLHDL", "LDLDIRECT" in the last 8760 hours. Lab Results  Component Value Date   HGBA1C 6.7 (H) 06/24/2022    Procedures since last visit: No results found.  Assessment/Plan 1. Acute pain of right knee Possible arthritis. Does not have any signs of Meniscal Tear Does not want to see Ortho Will try Meloxicam for 1 week  Also Prilosec for GI protection Reval if not better in 2 weeks Use walker for safety     Labs/tests ordered:  * No order type specified * Next appt:  12/08/2022

## 2022-11-24 NOTE — Patient Instructions (Signed)
Take both Medications with Food for one week and then as needed Can use Voltaren gel OTC Can rub on the knee BID to help with pain Use walker to walk

## 2022-12-03 DIAGNOSIS — L84 Corns and callosities: Secondary | ICD-10-CM | POA: Diagnosis not present

## 2022-12-03 DIAGNOSIS — E1159 Type 2 diabetes mellitus with other circulatory complications: Secondary | ICD-10-CM | POA: Diagnosis not present

## 2022-12-03 DIAGNOSIS — L602 Onychogryphosis: Secondary | ICD-10-CM | POA: Diagnosis not present

## 2022-12-08 ENCOUNTER — Non-Acute Institutional Stay: Payer: Medicare Other | Admitting: Internal Medicine

## 2022-12-08 ENCOUNTER — Encounter: Payer: Self-pay | Admitting: Internal Medicine

## 2022-12-08 VITALS — BP 110/68 | HR 92 | Temp 97.6°F | Resp 17 | Ht 64.0 in | Wt 145.0 lb

## 2022-12-08 DIAGNOSIS — M25561 Pain in right knee: Secondary | ICD-10-CM

## 2022-12-08 DIAGNOSIS — E1140 Type 2 diabetes mellitus with diabetic neuropathy, unspecified: Secondary | ICD-10-CM | POA: Diagnosis not present

## 2022-12-08 NOTE — Progress Notes (Signed)
Location: [facility] (12)  Provider:   Code Status:  Goals of Care:     12/08/2022    9:52 AM  Advanced Directives  Does Patient Have a Medical Advance Directive? Yes  Type of Paramedic of [place];Living will;Out of facility DNR (pink MOST or yellow form)  Does patient want to make changes to medical advance directive? No - Patient declined  Copy of [place] in Chart? Yes - validated most recent copy scanned in chart (See row information)     Chief Complaint  Patient presents with   Medical Management of Chronic Issues    2 week follow up   Immunizations    Discussed the need for tetanus , flu vaccine, covid vaccine     HPI: Patient is a 88 y.o. male seen today for an acute visit for follow-up of his right knee pain  Lives in [place] few weeks ago pain in his right Knee No Falls Just feel pain in the knee Limping when walks. But does not feel like it is locking or giving way Using the walker for safety  He did not want an x-ray or orthopedics referral I had started him on meloxicam but he never took He is using OTC Voltaren gel which  is helping He is still limping and using his walker. staying independent with his ADLs.  Has not had any falls.  Past Medical History:  Diagnosis Date   Abdominal pain, other specified site    Allergic rhinitis due to pollen    Benign paroxysmal positional vertigo    Disturbance of skin sensation    left great toe   Elevated prostate specific antigen (PSA)    Hematuria, unspecified    Hypertrophy of prostate without urinary obstruction and other lower urinary tract symptoms (LUTS)    Impotence of organic origin    Left knee pain 05/28/2014   Macrocytosis    Other and unspecified hyperlipidemia    Other malaise and fatigue    Spermatocele    bilateral   Type II or unspecified type diabetes mellitus without mention of complication,  uncontrolled    Unspecified essential hypertension    Unspecified glaucoma(365.9)     Past Surgical History:  Procedure Laterality Date   CATARACT EXTRACTION EXTRACAPSULAR  2008   bilateraly Dr. Katy Fitch   COLONOSCOPY  01/03/2002   normal Dr. Earlean Shawl   PROSTATE BIOPSY  1995   due to elevated PSA normal   TONSILLECTOMY      Allergies  Allergen Reactions   Metformin And Related Other (See Comments)    unknown   Sulfa Antibiotics Swelling   Tetanus Toxoids Other (See Comments)    unknown   Typhoid Vaccines Other (See Comments)    unknown    Outpatient Encounter Medications as of 12/08/2022  Medication Sig   aspirin 81 MG EC tablet Take 81 mg by mouth daily.   IBU 800 MG tablet Take 800 mg by mouth every 8 (eight) hours as needed.   meloxicam (MOBIC) 15 MG tablet Take 1 tablet (15 mg total) by mouth daily.   omeprazole (PRILOSEC) 40 MG capsule Take 1 capsule (40 mg total) by mouth daily for 14 days.   pioglitazone (ACTOS) 15 MG tablet Take 1 tablet (15 mg total) by mouth daily.   sitaGLIPtin (JANUVIA) 100 MG tablet TAKE 1 TABLET IN THE MORNING TO CONTROL DIABETES.   No facility-administered encounter  medications on file as of 12/08/2022.    Review of Systems:  Review of Systems  Constitutional:  Negative for activity change, appetite change and unexpected weight change.  HENT: Negative.    Respiratory:  Negative for cough and shortness of breath.   Cardiovascular:  Negative for leg swelling.  Gastrointestinal:  Negative for constipation.  Genitourinary:  Negative for frequency.  Musculoskeletal:  Positive for arthralgias, gait problem, joint swelling and myalgias.  Skin: Negative.  Negative for rash.  Neurological:  Negative for dizziness and weakness.  Psychiatric/Behavioral:  Negative for confusion and sleep disturbance.   All other systems reviewed and are negative.   Health Maintenance  Topic Date Due   DTaP/Tdap/Td (1 - Tdap) Never done   INFLUENZA VACCINE   07/20/2022   COVID-19 Vaccine (6 - 2023-24 season) 08/20/2022   HEMOGLOBIN A1C  12/25/2022   FOOT EXAM  03/04/2023   OPHTHALMOLOGY EXAM  03/17/2023   Medicare Annual Wellness (AWV)  03/30/2023   Pneumonia Vaccine 59+ Years old  Completed   HPV VACCINES  Aged Out   Zoster Vaccines- Shingrix  Discontinued    Physical Exam: Vitals:   12/08/22 0952  BP: 110/68  Pulse: 92  Resp: 17  Temp: 97.6 F (36.4 C)  TempSrc: Temporal  SpO2: 99%  Weight: 145 lb (65.8 kg)  Height: '5\' 4"'$  (1.626 m)   Body mass index is 24.89 kg/m. Physical Exam Vitals reviewed.  Constitutional:      Appearance: Normal appearance.  HENT:     Head: Normocephalic.     Nose: Nose normal.     Mouth/Throat:     Mouth: Mucous membranes are moist.     Pharynx: Oropharynx is clear.  Eyes:     Pupils: Pupils are equal, round, and reactive to light.  Cardiovascular:     Rate and Rhythm: Normal rate and regular rhythm.     Pulses: Normal pulses.     Heart sounds: No murmur heard. Pulmonary:     Effort: Pulmonary effort is normal. No respiratory distress.     Breath sounds: Normal breath sounds. No rales.  Abdominal:     General: Abdomen is flat. Bowel sounds are normal.     Palpations: Abdomen is soft.  Musculoskeletal:        General: No swelling.     Cervical back: Neck supple.     Comments:  Able to Flex and Extend his Knee with no pain Limps little when he tries to walk  Skin:    General: Skin is warm.  Neurological:     General: No focal deficit present.     Mental Status: He is alert and oriented to person, place, and time.  Psychiatric:        Mood and Affect: Mood normal.        Thought Content: Thought content normal.     Labs reviewed: Basic Metabolic Panel: Recent Labs    02/24/22 1502 06/24/22 0810 08/26/22 0800  NA 140 138 140  K 4.4 4.1 4.2  CL 104 104 104  CO2 30 30 33*  GLUCOSE 156* 122* 104*  BUN '22 24 23  '$ CREATININE 0.73 0.71 0.77  CALCIUM 8.7 8.6 8.8  TSH  --  1.34   --    Liver Function Tests: Recent Labs    02/24/22 1502 06/24/22 0810 08/26/22 0800  AST '15 14 14  '$ ALT 12 8* 9  BILITOT 1.0 1.1 1.1  PROT 6.4 6.3 6.3   No results for input(s): "  LIPASE", "AMYLASE" in the last 8760 hours. No results for input(s): "AMMONIA" in the last 8760 hours. CBC: Recent Labs    02/24/22 1502 06/24/22 0810 08/26/22 0800  WBC 7.2 5.2 6.1  NEUTROABS 3,888 2,907 3,196  HGB 13.5 12.1* 12.3*  HCT 39.9 36.3* 36.6*  MCV 99.0 99.2 98.4  PLT 328 289 283   Lipid Panel: No results for input(s): "CHOL", "HDL", "LDLCALC", "TRIG", "CHOLHDL", "LDLDIRECT" in the last 8760 hours. Lab Results  Component Value Date   HGBA1C 6.7 (H) 06/24/2022    Procedures since last visit: No results found.  Assessment/Plan 1. Acute pain of right knee Does not want Xray or ortho referral Continue OTC Voltaren   2. Type 2 diabetes mellitus with diabetic neuropathy, without long-term current use of insulin (HCC) Good control    Labs/tests ordered:  * No order type specified * Next appt:  02/21/2023

## 2022-12-23 DIAGNOSIS — L570 Actinic keratosis: Secondary | ICD-10-CM | POA: Diagnosis not present

## 2022-12-23 DIAGNOSIS — L821 Other seborrheic keratosis: Secondary | ICD-10-CM | POA: Diagnosis not present

## 2022-12-23 DIAGNOSIS — L814 Other melanin hyperpigmentation: Secondary | ICD-10-CM | POA: Diagnosis not present

## 2023-01-05 ENCOUNTER — Encounter: Payer: Medicare Other | Admitting: Internal Medicine

## 2023-01-11 ENCOUNTER — Other Ambulatory Visit: Payer: Self-pay | Admitting: Internal Medicine

## 2023-01-11 DIAGNOSIS — E119 Type 2 diabetes mellitus without complications: Secondary | ICD-10-CM

## 2023-01-31 ENCOUNTER — Other Ambulatory Visit: Payer: Medicare Other

## 2023-02-04 ENCOUNTER — Other Ambulatory Visit: Payer: Self-pay

## 2023-02-04 NOTE — Progress Notes (Signed)
error 

## 2023-02-09 ENCOUNTER — Encounter: Payer: Medicare Other | Admitting: Internal Medicine

## 2023-02-11 DIAGNOSIS — L602 Onychogryphosis: Secondary | ICD-10-CM | POA: Diagnosis not present

## 2023-02-11 DIAGNOSIS — L84 Corns and callosities: Secondary | ICD-10-CM | POA: Diagnosis not present

## 2023-02-11 DIAGNOSIS — E1159 Type 2 diabetes mellitus with other circulatory complications: Secondary | ICD-10-CM | POA: Diagnosis not present

## 2023-02-17 ENCOUNTER — Other Ambulatory Visit: Payer: Self-pay | Admitting: Internal Medicine

## 2023-02-17 DIAGNOSIS — I10 Essential (primary) hypertension: Secondary | ICD-10-CM

## 2023-02-17 DIAGNOSIS — E1140 Type 2 diabetes mellitus with diabetic neuropathy, unspecified: Secondary | ICD-10-CM

## 2023-02-17 DIAGNOSIS — E782 Mixed hyperlipidemia: Secondary | ICD-10-CM

## 2023-02-21 ENCOUNTER — Other Ambulatory Visit: Payer: Medicare Other

## 2023-02-21 DIAGNOSIS — H01132 Eczematous dermatitis of right lower eyelid: Secondary | ICD-10-CM | POA: Diagnosis not present

## 2023-02-21 DIAGNOSIS — H0102A Squamous blepharitis right eye, upper and lower eyelids: Secondary | ICD-10-CM | POA: Diagnosis not present

## 2023-02-21 DIAGNOSIS — H01134 Eczematous dermatitis of left upper eyelid: Secondary | ICD-10-CM | POA: Diagnosis not present

## 2023-02-21 DIAGNOSIS — H01131 Eczematous dermatitis of right upper eyelid: Secondary | ICD-10-CM | POA: Diagnosis not present

## 2023-02-21 DIAGNOSIS — H01135 Eczematous dermatitis of left lower eyelid: Secondary | ICD-10-CM | POA: Diagnosis not present

## 2023-02-21 DIAGNOSIS — H0102B Squamous blepharitis left eye, upper and lower eyelids: Secondary | ICD-10-CM | POA: Diagnosis not present

## 2023-02-24 DIAGNOSIS — E782 Mixed hyperlipidemia: Secondary | ICD-10-CM | POA: Diagnosis not present

## 2023-02-24 DIAGNOSIS — I10 Essential (primary) hypertension: Secondary | ICD-10-CM | POA: Diagnosis not present

## 2023-02-24 DIAGNOSIS — E1140 Type 2 diabetes mellitus with diabetic neuropathy, unspecified: Secondary | ICD-10-CM | POA: Diagnosis not present

## 2023-02-25 LAB — CBC WITH DIFFERENTIAL/PLATELET
Absolute Monocytes: 819 cells/uL (ref 200–950)
Basophils Absolute: 72 cells/uL (ref 0–200)
Basophils Relative: 1.1 %
Eosinophils Absolute: 247 cells/uL (ref 15–500)
Eosinophils Relative: 3.8 %
HCT: 37.2 % — ABNORMAL LOW (ref 38.5–50.0)
Hemoglobin: 12.4 g/dL — ABNORMAL LOW (ref 13.2–17.1)
Lymphs Abs: 1755 cells/uL (ref 850–3900)
MCH: 33.7 pg — ABNORMAL HIGH (ref 27.0–33.0)
MCHC: 33.3 g/dL (ref 32.0–36.0)
MCV: 101.1 fL — ABNORMAL HIGH (ref 80.0–100.0)
MPV: 9.5 fL (ref 7.5–12.5)
Monocytes Relative: 12.6 %
Neutro Abs: 3608 cells/uL (ref 1500–7800)
Neutrophils Relative %: 55.5 %
Platelets: 288 10*3/uL (ref 140–400)
RBC: 3.68 10*6/uL — ABNORMAL LOW (ref 4.20–5.80)
RDW: 19 % — ABNORMAL HIGH (ref 11.0–15.0)
Total Lymphocyte: 27 %
WBC: 6.5 10*3/uL (ref 3.8–10.8)

## 2023-02-25 LAB — HEMOGLOBIN A1C
Hgb A1c MFr Bld: 7.6 % of total Hgb — ABNORMAL HIGH (ref <5.7)
Mean Plasma Glucose: 171 mg/dL
eAG (mmol/L): 9.5 mmol/L

## 2023-02-25 LAB — COMPLETE METABOLIC PANEL WITH GFR
AG Ratio: 1.7 (calc) (ref 1.0–2.5)
ALT: 9 U/L (ref 9–46)
AST: 13 U/L (ref 10–35)
Albumin: 3.9 g/dL (ref 3.6–5.1)
Alkaline phosphatase (APISO): 62 U/L (ref 35–144)
BUN: 20 mg/dL (ref 7–25)
CO2: 31 mmol/L (ref 20–32)
Calcium: 8.6 mg/dL (ref 8.6–10.3)
Chloride: 105 mmol/L (ref 98–110)
Creat: 0.77 mg/dL (ref 0.70–1.22)
Globulin: 2.3 g/dL (calc) (ref 1.9–3.7)
Glucose, Bld: 141 mg/dL — ABNORMAL HIGH (ref 65–99)
Potassium: 4 mmol/L (ref 3.5–5.3)
Sodium: 140 mmol/L (ref 135–146)
Total Bilirubin: 1 mg/dL (ref 0.2–1.2)
Total Protein: 6.2 g/dL (ref 6.1–8.1)
eGFR: 82 mL/min/{1.73_m2} (ref >=60)

## 2023-02-25 LAB — TSH: TSH: 2.99 mIU/L (ref 0.40–4.50)

## 2023-03-02 ENCOUNTER — Encounter: Payer: Self-pay | Admitting: Internal Medicine

## 2023-03-02 ENCOUNTER — Non-Acute Institutional Stay: Payer: Medicare Other | Admitting: Internal Medicine

## 2023-03-02 ENCOUNTER — Other Ambulatory Visit: Payer: Self-pay | Admitting: Internal Medicine

## 2023-03-02 VITALS — BP 112/64 | HR 100 | Temp 97.8°F | Resp 17 | Ht 64.0 in | Wt 143.8 lb

## 2023-03-02 DIAGNOSIS — M79605 Pain in left leg: Secondary | ICD-10-CM

## 2023-03-02 DIAGNOSIS — M5412 Radiculopathy, cervical region: Secondary | ICD-10-CM | POA: Diagnosis not present

## 2023-03-02 DIAGNOSIS — E1140 Type 2 diabetes mellitus with diabetic neuropathy, unspecified: Secondary | ICD-10-CM

## 2023-03-02 DIAGNOSIS — I10 Essential (primary) hypertension: Secondary | ICD-10-CM

## 2023-03-02 DIAGNOSIS — E782 Mixed hyperlipidemia: Secondary | ICD-10-CM

## 2023-03-02 DIAGNOSIS — R21 Rash and other nonspecific skin eruption: Secondary | ICD-10-CM

## 2023-03-02 DIAGNOSIS — F5101 Primary insomnia: Secondary | ICD-10-CM

## 2023-03-02 MED ORDER — TRIAMCINOLONE ACETONIDE 0.1 % EX CREA: 1.0000 | TOPICAL_CREAM | Freq: Two times a day (BID) | CUTANEOUS | 0 refills | Status: DC

## 2023-03-02 NOTE — Progress Notes (Signed)
Location:  [facility] (12)  Provider:   Code Status:  Goals of Care:     03/02/2023   10:24 AM  Advanced Directives  Does Patient Have a Medical Advance Directive? Yes  Type of Paramedic of [place];Living will  Copy of [place] in Chart? No - copy requested     Chief Complaint  Patient presents with   Medical Management of Chronic Issues    4 month follow up with labs and discuss rash on arm and leg    HPI: Patient is a 89 y.o. male seen today for medical management of chronic diseases.    Lives in [place] in [facility] Wife now in SNF  Patient has h/o Diabetes mellitus, LE edema, Right Frozen Shoulder,And B12 def Post Prandial Dizziness Echo in the past is Normal EF  Cervical Radiculopathy with Left Arm Numbness  Acute issue Has developed itchy Rash in Right Arm some in Stomach area One in Right Thigh None on the back por Left Arm or chest No New Med No new soap or detergent Gait Is using walker now for safety Otherwise doing well  Past Medical History:  Diagnosis Date   Abdominal pain, other specified site    Allergic rhinitis due to pollen    Benign paroxysmal positional vertigo    Disturbance of skin sensation    left great toe   Elevated prostate specific antigen (PSA)    Hematuria, unspecified    Hypertrophy of prostate without urinary obstruction and other lower urinary tract symptoms (LUTS)    Impotence of organic origin    Left knee pain 05/28/2014   Macrocytosis    Other and unspecified hyperlipidemia    Other malaise and fatigue    Spermatocele    bilateral   Type II or unspecified type diabetes mellitus without mention of complication, uncontrolled    Unspecified essential hypertension    Unspecified glaucoma(365.9)     Past Surgical History:  Procedure Laterality Date   CATARACT EXTRACTION EXTRACAPSULAR  2008   bilateraly Dr. Katy Fitch   COLONOSCOPY  01/03/2002   normal  Dr. Earlean Shawl   PROSTATE BIOPSY  1995   due to elevated PSA normal   TONSILLECTOMY      Allergies  Allergen Reactions   Metformin And Related Other (See Comments)    unknown   Sulfa Antibiotics Swelling   Tetanus Toxoids Other (See Comments)    unknown   Typhoid Vaccines Other (See Comments)    unknown    Outpatient Encounter Medications as of 03/02/2023  Medication Sig   aspirin 81 MG EC tablet Take 81 mg by mouth daily.   pioglitazone (ACTOS) 15 MG tablet Take 1 tablet (15 mg total) by mouth daily.   sitaGLIPtin (JANUVIA) 100 MG tablet TAKE 1 TABLET IN THE MORNING TO CONTROL DIABETES.   triamcinolone cream (KENALOG) 0.1 % Apply 1 Application topically 2 (two) times daily.   omeprazole (PRILOSEC) 40 MG capsule Take 1 capsule (40 mg total) by mouth daily for 14 days.   [DISCONTINUED] IBU 800 MG tablet Take 800 mg by mouth every 8 (eight) hours as needed. (Patient not taking: Reported on 03/02/2023)   [DISCONTINUED] meloxicam (MOBIC) 15 MG tablet Take 1 tablet (15 mg total) by mouth daily. (Patient not taking: Reported on 03/02/2023)   No facility-administered encounter medications on file as of 03/02/2023.    Review of Systems:  Review of Systems  Constitutional:  Negative for activity change, appetite change and unexpected weight change.  HENT: Negative.    Respiratory:  Negative for cough and shortness of breath.   Cardiovascular:  Negative for leg swelling.  Gastrointestinal:  Negative for constipation.  Genitourinary:  Negative for frequency.  Musculoskeletal:  Positive for arthralgias and gait problem. Negative for myalgias.  Skin:  Positive for rash.  Neurological:  Negative for dizziness and weakness.  Psychiatric/Behavioral:  Negative for confusion and sleep disturbance.   All other systems reviewed and are negative.   Health Maintenance  Topic Date Due   DTaP/Tdap/Td (1 - Tdap) Never done   Medicare Annual Wellness (AWV)  03/30/2023   FOOT EXAM  03/04/2023    OPHTHALMOLOGY EXAM  03/17/2023   HEMOGLOBIN A1C  08/27/2023   Pneumonia Vaccine 57+ Years old  Completed   INFLUENZA VACCINE  Completed   COVID-19 Vaccine  Completed   HPV VACCINES  Aged Out   Zoster Vaccines- Shingrix  Discontinued    Physical Exam: Vitals:   03/02/23 1020  BP: 112/64  Pulse: 100  Resp: 17  Temp: 97.8 F (36.6 C)  TempSrc: Temporal  SpO2: 96%  Weight: 143 lb 12.8 oz (65.2 kg)  Height: '5\' 4"'$  (1.626 m)   Body mass index is 24.68 kg/m. Physical Exam Vitals reviewed.  Constitutional:      Appearance: Normal appearance.  HENT:     Head: Normocephalic.     Nose: Nose normal.     Mouth/Throat:     Mouth: Mucous membranes are moist.     Pharynx: Oropharynx is clear.  Eyes:     Pupils: Pupils are equal, round, and reactive to light.  Cardiovascular:     Rate and Rhythm: Normal rate and regular rhythm.     Pulses: Normal pulses.     Heart sounds: No murmur heard. Pulmonary:     Effort: Pulmonary effort is normal. No respiratory distress.     Breath sounds: Normal breath sounds. No rales.  Abdominal:     General: Abdomen is flat. Bowel sounds are normal.     Palpations: Abdomen is soft.  Musculoskeletal:        General: No swelling.     Cervical back: Neck supple.  Skin:    General: Skin is warm.     Comments: Small Papules in his Right Arm and one papule in his abdomen and Right Thigh  Neurological:     General: No focal deficit present.     Mental Status: He is alert and oriented to person, place, and time.  Psychiatric:        Mood and Affect: Mood normal.        Thought Content: Thought content normal.     Labs reviewed: Basic Metabolic Panel: Recent Labs    06/24/22 0810 08/26/22 0800 02/24/23 0812  NA 138 140 140  K 4.1 4.2 4.0  CL 104 104 105  CO2 30 33* 31  GLUCOSE 122* 104* 141*  BUN '24 23 20  '$ CREATININE 0.71 0.77 0.77  CALCIUM 8.6 8.8 8.6  TSH 1.34  --  2.99   Liver Function Tests: Recent Labs    06/24/22 0810  08/26/22 0800 02/24/23 0812  AST '14 14 13  '$ ALT 8* 9 9  BILITOT 1.1 1.1 1.0  PROT 6.3 6.3 6.2   No results for input(s): "LIPASE", "AMYLASE" in the last 8760 hours. No results for input(s): "AMMONIA" in the last 8760 hours. CBC: Recent Labs    06/24/22 0810 08/26/22  0800 02/24/23 0812  WBC 5.2 6.1 6.5  NEUTROABS 2,907 3,196 3,608  HGB 12.1* 12.3* 12.4*  HCT 36.3* 36.6* 37.2*  MCV 99.2 98.4 101.1*  PLT 289 283 288   Lipid Panel: No results for input(s): "CHOL", "HDL", "LDLCALC", "TRIG", "CHOLHDL", "LDLDIRECT" in the last 8760 hours. Lab Results  Component Value Date   HGBA1C 7.6 (H) 02/24/2023    Procedures since last visit: No results found.  Assessment/Plan 1. Type 2 diabetes mellitus with diabetic neuropathy, without long-term current use of insulin (HCC) A1C slightly high for him Continue same meds Diet Control  2. Rash ? If he itching as he has been stressed out due t his wife being in [place] He denies it Will try Triamcinolone Cream for few weeks Also Add to Facility Dermatology list  3. Essential hypertension No MEds  4. Left leg pain Walks with his walker now  5. Cervical radiculopathy Symptoms Controlled  6. Primary insomnia Can try OTC Melatonin      Labs/tests ordered:   Next appt:  03/14/2023

## 2023-03-02 NOTE — Patient Instructions (Signed)
Use Aquaphor OTC on the Lids and dry area at night

## 2023-03-08 DIAGNOSIS — L570 Actinic keratosis: Secondary | ICD-10-CM | POA: Diagnosis not present

## 2023-03-08 DIAGNOSIS — L820 Inflamed seborrheic keratosis: Secondary | ICD-10-CM | POA: Diagnosis not present

## 2023-03-08 DIAGNOSIS — L309 Dermatitis, unspecified: Secondary | ICD-10-CM | POA: Diagnosis not present

## 2023-03-11 DIAGNOSIS — L309 Dermatitis, unspecified: Secondary | ICD-10-CM | POA: Diagnosis not present

## 2023-03-14 ENCOUNTER — Non-Acute Institutional Stay: Payer: Medicare Other | Admitting: Orthopedic Surgery

## 2023-03-14 ENCOUNTER — Encounter: Payer: Self-pay | Admitting: Orthopedic Surgery

## 2023-03-14 VITALS — BP 111/67 | HR 86 | Temp 97.2°F | Resp 16 | Ht 64.0 in | Wt 146.7 lb

## 2023-03-14 DIAGNOSIS — R21 Rash and other nonspecific skin eruption: Secondary | ICD-10-CM | POA: Diagnosis not present

## 2023-03-14 NOTE — Patient Instructions (Signed)
Continue prednisone and hydrocortisone cream as prescribed by [facility] urgent care

## 2023-03-14 NOTE — Progress Notes (Signed)
Careteam: Patient Care Team: Virgie Dad, MD as PCP - General (Internal Medicine) Clent Jacks, MD as Consulting Physician (Ophthalmology) Lindwood Coke, MD as Consulting Physician (Dermatology) Thornell Sartorius, MD as Consulting Physician (Otolaryngology) Richmond Campbell, MD as Consulting Physician (Gastroenterology) [place], [facility] Wardell Honour, MD as Attending Physician (Family Medicine)  Seen by: Windell Moulding, AGNP-C  PLACE OF SERVICE:  [place] Directive information Does Patient Have a Medical Advance Directive?: Yes, Type of Advance Directive: [place];Living will, Does patient want to make changes to medical advance directive?: No - Patient declined  Allergies  Allergen Reactions   Metformin And Related Other (See Comments)    unknown   Sulfa Antibiotics Swelling   Tetanus Toxoids Other (See Comments)    unknown   Typhoid Vaccines Other (See Comments)    unknown    Chief Complaint  Patient presents with   Follow-up    Follow up on rash.     HPI: Patient is a 89 y.o. male seen today for acute visit due to recent rash.   03/22 he woke up with rash to upper arms and right lower leg. Rash was not painful or itchy. He denied recent change to soaps or detergents. No changes to medications. He does not know what caused rash. He ended up going to [facility] same day. He was prescribed hydrocortisone and prednisone taper. He did not bring medications with him, unclear of dose. He continues to take medication as prescribed. Today, rash has dried up and appears to be healing. He has no complaints today.   Review of Systems:  Review of Systems  Constitutional:  Negative for chills and fever.  Respiratory:  Negative for cough and shortness of breath.   Cardiovascular:  Negative for chest pain and leg swelling.  Skin:  Positive for rash.  Psychiatric/Behavioral:  Negative for depression. The patient is not nervous/anxious.      Past Medical History:  Diagnosis Date   Abdominal pain, other specified site    Allergic rhinitis due to pollen    Benign paroxysmal positional vertigo    Disturbance of skin sensation    left great toe   Elevated prostate specific antigen (PSA)    Hematuria, unspecified    Hypertrophy of prostate without urinary obstruction and other lower urinary tract symptoms (LUTS)    Impotence of organic origin    Left knee pain 05/28/2014   Macrocytosis    Other and unspecified hyperlipidemia    Other malaise and fatigue    Spermatocele    bilateral   Type II or unspecified type diabetes mellitus without mention of complication, uncontrolled    Unspecified essential hypertension    Unspecified glaucoma(365.9)    Past Surgical History:  Procedure Laterality Date   CATARACT EXTRACTION EXTRACAPSULAR  2008   bilateraly Dr. Katy Fitch   COLONOSCOPY  01/03/2002   normal Dr. Earlean Shawl   PROSTATE BIOPSY  1995   due to elevated PSA normal   TONSILLECTOMY     Social History:   reports that he quit smoking about 74 years ago. His smoking use included cigarettes. He has never used smokeless tobacco. He reports that he does not drink alcohol and does not use drugs.  Family History  Problem Relation Age of Onset   Cancer Father        lung   Cancer Brother        adrenal gland   Cancer Son  liver    Medications: Patient's Medications  New Prescriptions   No medications on file  Previous Medications   ASPIRIN 81 MG EC TABLET    Take 81 mg by mouth daily.   PIOGLITAZONE (ACTOS) 15 MG TABLET    Take 1 tablet (15 mg total) by mouth daily.   SITAGLIPTIN (JANUVIA) 100 MG TABLET    TAKE 1 TABLET IN THE MORNING TO CONTROL DIABETES.   TRIAMCINOLONE CREAM (KENALOG) 0.1 %    Apply 1 Application topically 2 (two) times daily.  Modified Medications   No medications on file  Discontinued Medications   OMEPRAZOLE (PRILOSEC) 40 MG CAPSULE    Take 1 capsule (40 mg total) by mouth daily for 14 days.     Physical Exam:  Vitals:   03/14/23 1354  BP: 111/67  Pulse: 86  Resp: 16  Temp: (!) 97.2 F (36.2 C)  SpO2: 98%  Weight: 146 lb 11.2 oz (66.5 kg)  Height: 5\' 4"  (1.626 m)   Body mass index is 25.18 kg/m. Wt Readings from Last 3 Encounters:  03/14/23 146 lb 11.2 oz (66.5 kg)  03/02/23 143 lb 12.8 oz (65.2 kg)  12/08/22 145 lb (65.8 kg)    Physical Exam Vitals reviewed.  Constitutional:      General: He is not in acute distress. HENT:     Head: Normocephalic.  Eyes:     General:        Right eye: No discharge.        Left eye: No discharge.  Cardiovascular:     Rate and Rhythm: Normal rate and regular rhythm.     Pulses: Normal pulses.     Heart sounds: Normal heart sounds.  Pulmonary:     Effort: Pulmonary effort is normal.     Breath sounds: Normal breath sounds.  Musculoskeletal:     Cervical back: Neck supple.  Skin:    General: Skin is warm.     Capillary Refill: Capillary refill takes less than 2 seconds.     Findings: Rash present.     Comments: Scattered red, round wheals to upper arms R>L, skin dried/flaking, no drainage or scratch marks.   Neurological:     General: No focal deficit present.     Mental Status: He is alert. Mental status is at baseline.     Motor: Weakness present.     Gait: Gait abnormal.     Comments: walker  Psychiatric:        Behavior: Behavior normal.     Labs reviewed: Basic Metabolic Panel: Recent Labs    06/24/22 0810 08/26/22 0800 02/24/23 0812  NA 138 140 140  K 4.1 4.2 4.0  CL 104 104 105  CO2 30 33* 31  GLUCOSE 122* 104* 141*  BUN 24 23 20   CREATININE 0.71 0.77 0.77  CALCIUM 8.6 8.8 8.6  TSH 1.34  --  2.99   Liver Function Tests: Recent Labs    06/24/22 0810 08/26/22 0800 02/24/23 0812  AST 14 14 13   ALT 8* 9 9  BILITOT 1.1 1.1 1.0  PROT 6.3 6.3 6.2   No results for input(s): "LIPASE", "AMYLASE" in the last 8760 hours. No results for input(s): "AMMONIA" in the last 8760 hours. CBC: Recent  Labs    06/24/22 0810 08/26/22 0800 02/24/23 0812  WBC 5.2 6.1 6.5  NEUTROABS 2,907 3,196 3,608  HGB 12.1* 12.3* 12.4*  HCT 36.3* 36.6* 37.2*  MCV 99.2 98.4 101.1*  PLT 289 283 288  Lipid Panel: No results for input(s): "CHOL", "HDL", "LDLCALC", "TRIG", "CHOLHDL", "LDLDIRECT" in the last 8760 hours. TSH: Recent Labs    06/24/22 0810 02/24/23 0812  TSH 1.34 2.99   A1C: Lab Results  Component Value Date   HGBA1C 7.6 (H) 02/24/2023     Assessment/Plan 1. Rash - onset 03/22> [facility]> prescribed hydrocortisone cream and prednisone taper - no changes to personal care products or medications - unknown etiology - small red round wheals> appear to be drying out - cont medications as prescribed  Total time: 15 minutes. Greater than 50% of total time spent doing patient education regarding skin rash including symptom/medication management.    Next appt: 04/04/2023  Windell Moulding, [facility] [phone]

## 2023-03-24 DIAGNOSIS — H1045 Other chronic allergic conjunctivitis: Secondary | ICD-10-CM | POA: Diagnosis not present

## 2023-03-24 DIAGNOSIS — H401131 Primary open-angle glaucoma, bilateral, mild stage: Secondary | ICD-10-CM | POA: Diagnosis not present

## 2023-03-24 DIAGNOSIS — Z961 Presence of intraocular lens: Secondary | ICD-10-CM | POA: Diagnosis not present

## 2023-03-24 DIAGNOSIS — H04123 Dry eye syndrome of bilateral lacrimal glands: Secondary | ICD-10-CM | POA: Diagnosis not present

## 2023-03-24 DIAGNOSIS — H3554 Dystrophies primarily involving the retinal pigment epithelium: Secondary | ICD-10-CM | POA: Diagnosis not present

## 2023-03-24 DIAGNOSIS — E119 Type 2 diabetes mellitus without complications: Secondary | ICD-10-CM | POA: Diagnosis not present

## 2023-03-24 LAB — HM DIABETES EYE EXAM

## 2023-04-04 ENCOUNTER — Non-Acute Institutional Stay (INDEPENDENT_AMBULATORY_CARE_PROVIDER_SITE_OTHER): Payer: Medicare Other | Admitting: Orthopedic Surgery

## 2023-04-04 ENCOUNTER — Encounter: Payer: Self-pay | Admitting: Orthopedic Surgery

## 2023-04-04 VITALS — BP 115/66 | HR 66 | Temp 97.8°F | Resp 16 | Ht 64.0 in | Wt 145.7 lb

## 2023-04-04 DIAGNOSIS — Z0000 Encounter for general adult medical examination without abnormal findings: Secondary | ICD-10-CM | POA: Diagnosis not present

## 2023-04-04 NOTE — Patient Instructions (Signed)
  Mr. George [name] , Thank you for taking time to come for your Medicare Wellness Visit. I appreciate your ongoing commitment to your health goals. Please review the following plan we discussed and let me know if I can assist you in the future.   These are the goals we discussed:  Goals      Check Blood sugar     Patient will check blood sugars        This is a list of the screening recommended for you and due dates:  Health Maintenance  Topic Date Due   Complete foot exam   03/04/2023   Flu Shot  07/21/2023   Hemoglobin A1C  08/27/2023   Eye exam for diabetics  03/23/2024   Medicare Annual Wellness Visit  04/03/2024   Pneumonia Vaccine  Completed   COVID-19 Vaccine  Completed   HPV Vaccine  Aged Out   DTaP/Tdap/Td vaccine  Discontinued   Zoster (Shingles) Vaccine  Discontinued   Recommend Prevnar 20 ( newest pneumonia vaccine)> may get at local pharmacy or at [facility] (office only NOT [facility]).

## 2023-04-04 NOTE — Progress Notes (Signed)
Subjective:   George [name] is a 89 y.o. male who presents for Medicare Annual/Subsequent preventive examination.  Place of Service:[facility] Provider: Hazle Nordmann, AGNP-C   Review of Systems     Cardiac Risk Factors include: advanced age (>51men, >55 women);male gender;hypertension     Objective:    Today's Vitals   04/04/23 1320  BP: 115/66  Pulse: 66  Resp: 16  Temp: 97.8 F (36.6 C)  SpO2: 96%  Weight: 145 lb 11.2 oz (66.1 kg)  Height:  (1.626 m)   Body mass index is 25.01 kg/m.     04/04/2023    1:33 PM 03/14/2023    1:58 PM 03/02/2023   10:24 AM 12/08/2022    9:52 AM 11/24/2022    9:00 AM 10/06/2022    3:59 PM 03/29/2022   11:48 AM  Advanced Directives  Does Patient Have a Medical Advance Directive? Yes Yes Yes Yes Yes Yes Yes  Type of Estate agent of [place];Living will Healthcare Power of [place];Living will Healthcare Power of [place];Living will Healthcare Power of [place];Living will;Out of facility DNR (pink MOST or yellow form) Healthcare Power of [place];Living will;Out of facility DNR (pink MOST or yellow form) Healthcare Power of [place];Living will;Out of facility DNR (pink MOST or yellow form) Healthcare Power of [place];Living will  Does patient want to make changes to medical advance directive? No - Patient declined No - Patient declined  No - Patient declined   No - Patient declined  Copy of Healthcare Power of Attorney in Chart? Yes - validated most recent copy scanned in chart (See row information) Yes - validated most recent copy scanned in chart (See row information) No - copy requested Yes - validated most recent copy scanned in chart (See row information) Yes - validated most recent copy scanned in chart (See row information) Yes - validated most recent copy scanned in chart (See row information) Yes - validated most recent copy scanned in chart (See row information)    Current Medications  (verified) Outpatient Encounter Medications as of 04/04/2023  Medication Sig   aspirin 81 MG EC tablet Take 81 mg by mouth daily.   pioglitazone (ACTOS) 15 MG tablet Take 1 tablet (15 mg total) by mouth daily.   sitaGLIPtin (JANUVIA) 100 MG tablet TAKE 1 TABLET IN THE MORNING TO CONTROL DIABETES.   [DISCONTINUED] triamcinolone cream (KENALOG) 0.1 % Apply 1 Application topically 2 (two) times daily. (Patient not taking: Reported on 03/14/2023)   No facility-administered encounter medications on file as of 04/04/2023.    Allergies (verified) Metformin and related, Sulfa antibiotics, Tetanus toxoids, and Typhoid vaccines   History: Past Medical History:  Diagnosis Date   Abdominal pain, other specified site    Allergic rhinitis due to pollen    Benign paroxysmal positional vertigo    Disturbance of skin sensation    left great toe   Elevated prostate specific antigen (PSA)    Hematuria, unspecified    Hypertrophy of prostate without urinary obstruction and other lower urinary tract symptoms (LUTS)    Impotence of organic origin    Left knee pain 05/28/2014   Macrocytosis    Other and unspecified hyperlipidemia    Other malaise and fatigue    Spermatocele    bilateral   Type II or unspecified type diabetes mellitus without mention of complication, uncontrolled    Unspecified essential hypertension    Unspecified glaucoma(365.9)    Past Surgical History:  Procedure Laterality Date  CATARACT EXTRACTION EXTRACAPSULAR  2008   bilateraly Dr. Dione Booze   COLONOSCOPY  01/03/2002   normal Dr. Kinnie Scales   PROSTATE BIOPSY  1995   due to elevated PSA normal   TONSILLECTOMY     Family History  Problem Relation Age of Onset   Cancer Father        lung   Cancer Brother        adrenal gland   Cancer Son        liver   Social History   Socioeconomic History   Marital status: Married    Spouse name: Not on file   Number of children: Not on file   Years of education: Not on file   Highest  education level: Not on file  Occupational History   Occupation: retired, self employed  Tobacco Use   Smoking status: Former    Years: 4    Types: Cigarettes    Quit date: 04/09/1948    Years since quitting: 75.0   Smokeless tobacco: Never  Vaping Use   Vaping Use: Never used  Substance and Sexual Activity   Alcohol use: No   Drug use: No   Sexual activity: Yes  Other Topics Concern   Not on file  Social History Narrative   [facility] at [facility]    Married    Living Will   Social Determinants of Health   Financial Resource Strain: Low Risk  (04/04/2023)   Overall Financial Resource Strain (CARDIA)    Difficulty of Paying Living Expenses: Not hard at all  Food Insecurity: No Food Insecurity (04/04/2023)   Hunger Vital Sign    Worried About Running Out of Food in the Last Year: Never true    Ran Out of Food in the Last Year: Never true  Transportation Needs: No Transportation Needs (03/29/2022)   PRAPARE - Administrator, Civil Service (Medical): No    Lack of Transportation (Non-Medical): No  Physical Activity: Insufficiently Active (04/04/2023)   Exercise Vital Sign    Days of Exercise per Week: 7 days    Minutes of Exercise per Session: 20 min  Stress: No Stress Concern Present (04/04/2023)   [employer] of Occupational Health - Occupational Stress Questionnaire    Feeling of Stress : Only a little  Social Connections: Socially Integrated (04/04/2023)   Social Connection and Isolation Panel [NHANES]    Frequency of Communication with Friends and Family: More than three times a week    Frequency of Social Gatherings with Friends and Family: More than three times a week    Attends Religious Services: More than 4 times per year    Active Member of [employer] or Organizations: Yes    Attends Engineer, structural: More than 4 times per year    Marital Status: Married    Tobacco Counseling Counseling given: Not Answered   Clinical  Intake:  Pre-visit preparation completed: No  Pain : No/denies pain     BMI - recorded: 25.01 Nutritional Status: BMI 25 -29 Overweight Nutritional Risks: None Diabetes: Yes CBG done?: No Did pt. bring in CBG monitor from home?: No  How often do you need to have someone help you when you read instructions, pamphlets, or other written materials from your doctor or pharmacy?: 3 - Sometimes What is the last grade level you completed in school?: College  Diabetic?Yes  Interpreter Needed?: No      Activities of Daily Living    04/04/2023  1:50 PM  In your present state of health, do you have any difficulty performing the following activities:  Hearing? 0  Vision? 0  Difficulty concentrating or making decisions? 0  Walking or climbing stairs? 0  Dressing or bathing? 0  Doing errands, shopping? 0  Preparing Food and eating ? N  Using the Toilet? N  In the past six months, have you accidently leaked urine? N  Do you have problems with loss of bowel control? N  Managing your Medications? N  Managing your Finances? N  Housekeeping or managing your Housekeeping? N    Patient Care Team: Mahlon Gammon, MD as PCP - General (Internal Medicine) Ernesto Rutherford, MD as Consulting Physician (Ophthalmology) Leta Speller, MD as Consulting Physician (Dermatology) Keturah Barre, MD as Consulting Physician (Otolaryngology) Sharrell Ku, MD as Consulting Physician (Gastroenterology) [place], [facility] Frederica Kuster, MD as Attending Physician (Family Medicine)  Indicate any recent Medical Services you may have received from other than [facility] providers in the past year (date may be approximate).     Assessment:   This is a routine wellness examination for Garcia.  Hearing/Vision screen Hearing Screening - Comments:: No hearing concerns. Patient has hearing aids but doesn't really wear them. Vision Screening - Comments:: No vision concerns. Patient last eye exam 2024.  Patient wears prescription glasses.   Dietary issues and exercise activities discussed: Current Exercise Habits: The patient does not participate in regular exercise at present, Exercise limited by: cardiac condition(s);Other - see comments (advanced age)   Goals Addressed             This Visit's Progress    Check Blood sugar   Not on track    Patient will check blood sugars       Depression Screen    04/04/2023    1:49 PM 04/04/2023    1:27 PM 03/02/2023   10:24 AM 12/08/2022    9:51 AM 11/24/2022    9:00 AM 10/06/2022    3:58 PM 03/29/2022    1:41 PM  PHQ 2/9 Scores  PHQ - 2 Score 0 0 0 0 0 0 0    Fall Risk    04/04/2023    1:49 PM 04/04/2023    1:27 PM 03/14/2023    1:58 PM 03/02/2023   10:23 AM 12/08/2022    9:51 AM  Fall Risk   Falls in the past year? 0 0 0 0 0  Number falls in past yr: 0 0 0 0 0  Injury with Fall? 0 0 0 0 0  Risk for fall due to : Impaired balance/gait;Impaired mobility No Fall Risks No Fall Risks No Fall Risks No Fall Risks  Follow up Falls evaluation completed;Education provided;Falls prevention discussed Falls evaluation completed  Falls evaluation completed Falls evaluation completed    FALL RISK PREVENTION PERTAINING TO THE HOME:  Any stairs in or around the home? No  If so, are there any without handrails? No  Home free of loose throw rugs in walkways, pet beds, electrical cords, etc? Yes  Adequate lighting in your home to reduce risk of falls? Yes   ASSISTIVE DEVICES UTILIZED TO PREVENT FALLS:  Life alert? No  Use of a cane, walker or w/c? Yes  Grab bars in the bathroom? Yes  Shower chair or bench in shower? Yes  Elevated toilet seat or a handicapped toilet? Yes   TIMED UP AND GO:  Was the test performed? No .  Length of time to ambulate 10  feet: N/A sec.   Gait slow and steady with assistive device  Cognitive Function:    04/04/2023    1:28 PM 03/29/2022    1:41 PM 03/28/2019    2:02 PM 02/08/2018   10:15 AM 08/26/2017     8:56 AM  MMSE - Mini Mental State Exam  Orientation to time 5 2 4 4 3   Orientation to Place 5 4 5 5 5   Registration 3 3 3 3 3   Attention/ Calculation 3 3 5 5  0  Recall 2 3 3 3  0  Language- name 2 objects 2 2 2 2 2   Language- repeat 1 1 0 1 1  Language- follow 3 step command 3 3 3 3 3   Language- read & follow direction 1 1 1 1 1   Write a sentence 1 1 1 1 1   Copy design 0 1 1 1 1   Total score 26 24 28 29 20         Immunizations Immunization History  Administered Date(s) Administered   Covid-19, Mrna,Vaccine(Spikevax)5yrs and older 10/26/2022   Fluad Quad(high Dose 65+) 10/26/2022   Influenza Whole 09/19/2012, 09/20/2013   Influenza, High Dose Seasonal PF 09/28/2017, 10/03/2019   Influenza,inj,Quad PF,6+ Mos 09/21/2018   Influenza-Unspecified 10/03/2014, 09/18/2015, 09/30/2016, 09/23/2020, 09/24/2021   Moderna SARS-COV2 Booster Vaccination 12/01/2020   Moderna Sars-Covid-2 Vaccination 12/24/2019, 01/21/2020, 09/09/2021   Pfizer Covid-19 Vaccine Bivalent Booster 55yrs & up 09/09/2021   Pneumococcal Conjugate-13 08/27/2017   Pneumococcal Polysaccharide-23 10/24/2001   Zoster, Unspecified 02/09/2006    TDAP status: Due, Education has been provided regarding the importance of this vaccine. Advised may receive this vaccine at local pharmacy or Health Dept. Aware to provide a copy of the vaccination record if obtained from local pharmacy or Health Dept. Verbalized acceptance and understanding. Allergy to tetanus  Flu Vaccine status: Up to date  Pneumococcal vaccine status: Due, Education has been provided regarding the importance of this vaccine. Advised may receive this vaccine at local pharmacy or Health Dept. Aware to provide a copy of the vaccination record if obtained from local pharmacy or Health Dept. Verbalized acceptance and understanding.  Covid-19 vaccine status: Completed vaccines  Qualifies for Shingles Vaccine? Yes   Zostavax completed Yes   Shingrix Completed?: No.     Education has been provided regarding the importance of this vaccine. Patient has been advised to call insurance company to determine out of pocket expense if they have not yet received this vaccine. Advised may also receive vaccine at local pharmacy or Health Dept. Verbalized acceptance and understanding.  Screening Tests Health Maintenance  Topic Date Due   DTaP/Tdap/Td (1 - Tdap) Never done   FOOT EXAM  03/04/2023   INFLUENZA VACCINE  07/21/2023   HEMOGLOBIN A1C  08/27/2023   OPHTHALMOLOGY EXAM  03/23/2024   Medicare Annual Wellness (AWV)  04/03/2024   Pneumonia Vaccine 9+ Years old  Completed   COVID-19 Vaccine  Completed   HPV VACCINES  Aged Out   Zoster Vaccines- Shingrix  Discontinued    Health Maintenance  Health Maintenance Due  Topic Date Due   DTaP/Tdap/Td (1 - Tdap) Never done   FOOT EXAM  03/04/2023    Colorectal cancer screening: No longer required.   Lung Cancer Screening: (Low Dose CT Chest recommended if Age 31-80 years, 30 pack-year currently smoking OR have quit w/in 15years.) does not qualify.   Lung Cancer Screening Referral: No  Additional Screening:  Hepatitis C Screening: does not qualify; Completed   Vision Screening: Recommended annual  ophthalmology exams for early detection of glaucoma and other disorders of the eye. Is the patient up to date with their annual eye exam?  Yes  Who is the provider or what is the name of the office in which the patient attends annual eye exams? Dr. Dione Booze If pt is not established with a provider, would they like to be referred to a provider to establish care? No .   Dental Screening: Recommended annual dental exams for proper oral hygiene  Community Resource Referral / Chronic Care Management: CRR required this visit?  No   CCM required this visit?  No      Plan:     I have personally reviewed and noted the following in the patient's chart:   Medical and social history Use of alcohol, tobacco or  illicit drugs  Current medications and supplements including opioid prescriptions. Patient is not currently taking opioid prescriptions. Functional ability and status Nutritional status Physical activity Advanced directives List of other physicians Hospitalizations, surgeries, and ER visits in previous 12 months Vitals Screenings to include cognitive, depression, and falls Referrals and appointments  In addition, I have reviewed and discussed with patient certain preventive protocols, quality metrics, and best practice recommendations. A written personalized care plan for preventive services as well as general preventive health recommendations were provided to patient.     Octavia Heir, NP   04/04/2023   Nurse Notes: Recommend Prevnar 20, unable to have tetanus vaccine due to allergy

## 2023-04-06 ENCOUNTER — Other Ambulatory Visit: Payer: Self-pay | Admitting: Internal Medicine

## 2023-04-06 DIAGNOSIS — E119 Type 2 diabetes mellitus without complications: Secondary | ICD-10-CM

## 2023-04-13 ENCOUNTER — Ambulatory Visit (INDEPENDENT_AMBULATORY_CARE_PROVIDER_SITE_OTHER): Payer: Medicare Other

## 2023-04-13 DIAGNOSIS — Z23 Encounter for immunization: Secondary | ICD-10-CM | POA: Diagnosis not present

## 2023-04-25 NOTE — Addendum Note (Signed)
Addended by: Michaell Cowing on: 04/25/2023 09:58 AM   Modules accepted: Orders

## 2023-04-26 DIAGNOSIS — E1159 Type 2 diabetes mellitus with other circulatory complications: Secondary | ICD-10-CM | POA: Diagnosis not present

## 2023-04-26 DIAGNOSIS — L84 Corns and callosities: Secondary | ICD-10-CM | POA: Diagnosis not present

## 2023-04-26 DIAGNOSIS — B351 Tinea unguium: Secondary | ICD-10-CM | POA: Diagnosis not present

## 2023-08-23 ENCOUNTER — Other Ambulatory Visit: Payer: Self-pay | Admitting: Internal Medicine

## 2023-09-01 ENCOUNTER — Other Ambulatory Visit: Payer: Medicare Other

## 2023-09-01 DIAGNOSIS — E1140 Type 2 diabetes mellitus with diabetic neuropathy, unspecified: Secondary | ICD-10-CM | POA: Diagnosis not present

## 2023-09-06 DIAGNOSIS — L570 Actinic keratosis: Secondary | ICD-10-CM | POA: Diagnosis not present

## 2023-09-07 ENCOUNTER — Encounter: Payer: Medicare Other | Admitting: Internal Medicine

## 2023-09-14 ENCOUNTER — Encounter: Payer: Self-pay | Admitting: Internal Medicine

## 2023-09-14 ENCOUNTER — Non-Acute Institutional Stay: Payer: Medicare Other | Admitting: Internal Medicine

## 2023-09-14 VITALS — BP 112/68 | HR 84 | Temp 97.8°F | Resp 17 | Ht 64.0 in | Wt 143.0 lb

## 2023-09-14 DIAGNOSIS — I10 Essential (primary) hypertension: Secondary | ICD-10-CM

## 2023-09-14 DIAGNOSIS — E1140 Type 2 diabetes mellitus with diabetic neuropathy, unspecified: Secondary | ICD-10-CM | POA: Diagnosis not present

## 2023-09-14 DIAGNOSIS — F5101 Primary insomnia: Secondary | ICD-10-CM

## 2023-09-14 DIAGNOSIS — M5412 Radiculopathy, cervical region: Secondary | ICD-10-CM | POA: Diagnosis not present

## 2023-09-14 DIAGNOSIS — E782 Mixed hyperlipidemia: Secondary | ICD-10-CM

## 2023-09-14 DIAGNOSIS — E538 Deficiency of other specified B group vitamins: Secondary | ICD-10-CM

## 2023-09-14 MED ORDER — PIOGLITAZONE HCL 30 MG PO TABS
30.0000 mg | ORAL_TABLET | Freq: Every day | ORAL | 3 refills | Status: DC
Start: 2023-09-14 — End: 2024-09-24

## 2023-09-14 NOTE — Patient Instructions (Signed)
Covid shot is 10/26/2023 Flu Shot is on 10/18/2023

## 2023-09-14 NOTE — Progress Notes (Signed)
Location:  [facility]:  Clinic (12)  Provider:   Code Status: DNR Goals of Care:     09/14/2023    9:25 AM  Advanced Directives  Does Patient Have a Medical Advance Directive? Yes  Type of Estate agent of [place];Living will;Out of facility DNR (pink MOST or yellow form)  Does patient want to make changes to medical advance directive? No - Patient declined  Copy of Healthcare Power of Attorney in Chart? No - copy requested     Chief Complaint  Patient presents with   Medical Management of Chronic Issues    Patient is being seen for a 6 month follow up with labs.   Immunizations    Discuss the need for covid and flu vaccine   Health Maintenance    Patient is due for a foot exam.    HPI: Patient is a 89 y.o. male seen today for medical management of chronic diseases.   Lives [place] in [facility] Wife now in SNF   Patient has h/o Diabetes mellitus, LE edema, Right Frozen Shoulder,And B12 def Post Prandial Dizziness Echo in the past is Normal EF  Cervical Radiculopathy with Left Arm Numbness   Doing well No acute issue A1C and weight up He says he has been eating more candy  Walking with his walker now for safety  Past Medical History:  Diagnosis Date   Abdominal pain, other specified site    Allergic rhinitis due to pollen    Benign paroxysmal positional vertigo    Disturbance of skin sensation    left great toe   Elevated prostate specific antigen (PSA)    Hematuria, unspecified    Hypertrophy of prostate without urinary obstruction and other lower urinary tract symptoms (LUTS)    Impotence of organic origin    Left knee pain 05/28/2014   Macrocytosis    Other and unspecified hyperlipidemia    Other malaise and fatigue    Spermatocele    bilateral   Type II or unspecified type diabetes mellitus without mention of complication, uncontrolled    Unspecified essential hypertension    Unspecified glaucoma(365.9)      Past Surgical History:  Procedure Laterality Date   CATARACT EXTRACTION EXTRACAPSULAR  2008   bilateraly Dr. Dione Booze   COLONOSCOPY  01/03/2002   normal Dr. Kinnie Scales   PROSTATE BIOPSY  1995   due to elevated PSA normal   TONSILLECTOMY      Allergies  Allergen Reactions   Metformin And Related Other (See Comments)    unknown   Sulfa Antibiotics Swelling   Tetanus Toxoids Other (See Comments)    unknown   Typhoid Vaccines Other (See Comments)    unknown    Outpatient Encounter Medications as of 09/14/2023  Medication Sig   aspirin 81 MG EC tablet Take 81 mg by mouth daily.   pioglitazone (ACTOS) 30 MG tablet Take 1 tablet (30 mg total) by mouth daily.   sitaGLIPtin (JANUVIA) 100 MG tablet TAKE 1 TABLET IN THE MORNING TO CONTROL DIABETES.   [DISCONTINUED] pioglitazone (ACTOS) 15 MG tablet TAKE ONE TABLET BY MOUTH DAILY   No facility-administered encounter medications on file as of 09/14/2023.    Review of Systems:  Review of Systems  Constitutional:  Negative for activity change, appetite change and unexpected weight change.  HENT: Negative.    Respiratory:  Negative for cough and shortness of breath.   Cardiovascular:  Negative for leg swelling.  Gastrointestinal:  Negative for constipation.  Genitourinary:  Negative for frequency.  Musculoskeletal:  Negative for arthralgias, gait problem and myalgias.  Skin: Negative.  Negative for rash.  Neurological:  Negative for dizziness and weakness.  Psychiatric/Behavioral:  Negative for confusion and sleep disturbance.   All other systems reviewed and are negative.   Health Maintenance  Topic Date Due   FOOT EXAM  03/04/2023   INFLUENZA VACCINE  07/21/2023   COVID-19 Vaccine (7 - 2023-24 season) 08/21/2023   HEMOGLOBIN A1C  02/29/2024   OPHTHALMOLOGY EXAM  03/23/2024   Medicare Annual Wellness (AWV)  04/03/2024   Pneumonia Vaccine 66+ Years old  Completed   HPV VACCINES  Aged Out   DTaP/Tdap/Td  Discontinued   Zoster  Vaccines- Shingrix  Discontinued    Physical Exam: Vitals:   09/14/23 0922  BP: 112/68  Pulse: 84  Resp: 17  Temp: 97.8 F (36.6 C)  TempSrc: Temporal  SpO2: 96%  Weight: 143 lb (64.9 kg)  Height: 5\' 4"  (1.626 m)   Body mass index is 24.55 kg/m. Physical Exam Vitals reviewed.  Constitutional:      Appearance: Normal appearance.  HENT:     Head: Normocephalic.     Nose: Nose normal.     Mouth/Throat:     Mouth: Mucous membranes are moist.     Pharynx: Oropharynx is clear.  Eyes:     Pupils: Pupils are equal, round, and reactive to light.  Cardiovascular:     Rate and Rhythm: Normal rate and regular rhythm.     Pulses: Normal pulses.     Heart sounds: No murmur heard. Pulmonary:     Effort: Pulmonary effort is normal. No respiratory distress.     Breath sounds: Normal breath sounds. No rales.  Abdominal:     General: Abdomen is flat. Bowel sounds are normal.     Palpations: Abdomen is soft.  Musculoskeletal:        General: No swelling.     Cervical back: Neck supple.  Skin:    General: Skin is warm.     Comments: Has Lesion on his scalp which was zapped by Dermatology  Neurological:     General: No focal deficit present.     Mental Status: He is alert and oriented to person, place, and time.  Psychiatric:        Mood and Affect: Mood normal.        Thought Content: Thought content normal.     Labs reviewed: Basic Metabolic Panel: Recent Labs    02/24/23 0812 09/01/23 0800  NA 140 140  K 4.0 4.0  CL 105 102  CO2 31 29  GLUCOSE 141* 137*  BUN 20 17  CREATININE 0.77 0.68*  CALCIUM 8.6 8.8  TSH 2.99 1.97   Liver Function Tests: Recent Labs    02/24/23 0812 09/01/23 0800  AST 13 14  ALT 9 7*  BILITOT 1.0 1.1  PROT 6.2 6.5   No results for input(s): "LIPASE", "AMYLASE" in the last 8760 hours. No results for input(s): "AMMONIA" in the last 8760 hours. CBC: Recent Labs    02/24/23 0812 09/01/23 0800  WBC 6.5 10.9*  NEUTROABS 3,608 8,295*   HGB 12.4* 12.8*  HCT 37.2* 37.4*  MCV 101.1* 101.4*  PLT 288 339   Lipid Panel: No results for input(s): "CHOL", "HDL", "LDLCALC", "TRIG", "CHOLHDL", "LDLDIRECT" in the last 8760 hours. Lab Results  Component Value Date   HGBA1C 8.0 (H) 09/01/2023    Procedures since  last visit: No results found.  Assessment/Plan 1. Type 2 diabetes mellitus with diabetic neuropathy, without long-term current use of insulin (HCC) Increase Actos to 30 mg   2. Unstable gait Uses walker now   3. Essential hypertension No Meds  4. Cervical radiculopathy Conservative Management  5 Mild Leucocytosis No signs of infection Repeat Labs    Labs/tests ordered:  * No order type specified * Next appt:  Visit date not found

## 2023-09-16 DIAGNOSIS — E1159 Type 2 diabetes mellitus with other circulatory complications: Secondary | ICD-10-CM | POA: Diagnosis not present

## 2023-09-16 DIAGNOSIS — L84 Corns and callosities: Secondary | ICD-10-CM | POA: Diagnosis not present

## 2023-09-16 DIAGNOSIS — L602 Onychogryphosis: Secondary | ICD-10-CM | POA: Diagnosis not present

## 2023-09-16 DIAGNOSIS — M5412 Radiculopathy, cervical region: Secondary | ICD-10-CM | POA: Insufficient documentation

## 2023-10-26 DIAGNOSIS — Z23 Encounter for immunization: Secondary | ICD-10-CM | POA: Diagnosis not present

## 2023-11-30 DIAGNOSIS — H04123 Dry eye syndrome of bilateral lacrimal glands: Secondary | ICD-10-CM | POA: Diagnosis not present

## 2023-11-30 DIAGNOSIS — H0102B Squamous blepharitis left eye, upper and lower eyelids: Secondary | ICD-10-CM | POA: Diagnosis not present

## 2023-11-30 DIAGNOSIS — H0102A Squamous blepharitis right eye, upper and lower eyelids: Secondary | ICD-10-CM | POA: Diagnosis not present

## 2023-11-30 DIAGNOSIS — H02135 Senile ectropion of left lower eyelid: Secondary | ICD-10-CM | POA: Diagnosis not present

## 2023-12-07 DIAGNOSIS — H01135 Eczematous dermatitis of left lower eyelid: Secondary | ICD-10-CM | POA: Diagnosis not present

## 2023-12-07 DIAGNOSIS — H0102A Squamous blepharitis right eye, upper and lower eyelids: Secondary | ICD-10-CM | POA: Diagnosis not present

## 2023-12-07 DIAGNOSIS — H0102B Squamous blepharitis left eye, upper and lower eyelids: Secondary | ICD-10-CM | POA: Diagnosis not present

## 2023-12-07 DIAGNOSIS — H01132 Eczematous dermatitis of right lower eyelid: Secondary | ICD-10-CM | POA: Diagnosis not present

## 2024-01-05 ENCOUNTER — Other Ambulatory Visit: Payer: Medicare Other

## 2024-01-05 DIAGNOSIS — I10 Essential (primary) hypertension: Secondary | ICD-10-CM | POA: Diagnosis not present

## 2024-01-05 DIAGNOSIS — E1140 Type 2 diabetes mellitus with diabetic neuropathy, unspecified: Secondary | ICD-10-CM | POA: Diagnosis not present

## 2024-01-05 DIAGNOSIS — E538 Deficiency of other specified B group vitamins: Secondary | ICD-10-CM | POA: Diagnosis not present

## 2024-01-06 LAB — CBC WITH DIFFERENTIAL/PLATELET
Absolute Lymphocytes: 1125 {cells}/uL (ref 850–3900)
Absolute Monocytes: 665 {cells}/uL (ref 200–950)
Basophils Absolute: 50 {cells}/uL (ref 0–200)
Basophils Relative: 1 %
Eosinophils Absolute: 180 {cells}/uL (ref 15–500)
Eosinophils Relative: 3.6 %
HCT: 37 % — ABNORMAL LOW (ref 38.5–50.0)
Hemoglobin: 12.4 g/dL — ABNORMAL LOW (ref 13.2–17.1)
MCH: 34.3 pg — ABNORMAL HIGH (ref 27.0–33.0)
MCHC: 33.5 g/dL (ref 32.0–36.0)
MCV: 102.5 fL — ABNORMAL HIGH (ref 80.0–100.0)
MPV: 9.7 fL (ref 7.5–12.5)
Monocytes Relative: 13.3 %
Neutro Abs: 2980 {cells}/uL (ref 1500–7800)
Neutrophils Relative %: 59.6 %
Platelets: 290 10*3/uL (ref 140–400)
RBC: 3.61 10*6/uL — ABNORMAL LOW (ref 4.20–5.80)
RDW: 19.8 % — ABNORMAL HIGH (ref 11.0–15.0)
Total Lymphocyte: 22.5 %
WBC: 5 10*3/uL (ref 3.8–10.8)

## 2024-01-06 LAB — VITAMIN B12: Vitamin B-12: 463 pg/mL (ref 200–1100)

## 2024-01-06 LAB — COMPLETE METABOLIC PANEL WITH GFR
AG Ratio: 1.8 (calc) (ref 1.0–2.5)
ALT: 9 U/L (ref 9–46)
AST: 14 U/L (ref 10–35)
Albumin: 4 g/dL (ref 3.6–5.1)
Alkaline phosphatase (APISO): 70 U/L (ref 35–144)
BUN: 22 mg/dL (ref 7–25)
CO2: 28 mmol/L (ref 20–32)
Calcium: 8.5 mg/dL — ABNORMAL LOW (ref 8.6–10.3)
Chloride: 105 mmol/L (ref 98–110)
Creat: 0.73 mg/dL (ref 0.70–1.22)
Globulin: 2.2 g/dL (ref 1.9–3.7)
Glucose, Bld: 164 mg/dL — ABNORMAL HIGH (ref 65–99)
Potassium: 4.2 mmol/L (ref 3.5–5.3)
Sodium: 141 mmol/L (ref 135–146)
Total Bilirubin: 0.8 mg/dL (ref 0.2–1.2)
Total Protein: 6.2 g/dL (ref 6.1–8.1)
eGFR: 83 mL/min/{1.73_m2} (ref >=60)

## 2024-01-06 LAB — LIPID PANEL
Cholesterol: 141 mg/dL (ref <200)
HDL: 60 mg/dL (ref >=40)
LDL Cholesterol (Calc): 66 mg/dL
Non-HDL Cholesterol (Calc): 81 mg/dL (ref <130)
Total CHOL/HDL Ratio: 2.4 (calc) (ref <5.0)
Triglycerides: 69 mg/dL (ref <150)

## 2024-01-06 LAB — HEMOGLOBIN A1C
Hgb A1c MFr Bld: 8.2 %{Hb} — ABNORMAL HIGH (ref <5.7)
Mean Plasma Glucose: 189 mg/dL
eAG (mmol/L): 10.4 mmol/L

## 2024-01-11 ENCOUNTER — Encounter: Payer: Self-pay | Admitting: Internal Medicine

## 2024-01-11 ENCOUNTER — Non-Acute Institutional Stay: Payer: Medicare Other | Admitting: Internal Medicine

## 2024-01-11 VITALS — BP 128/70 | HR 89 | Temp 98.7°F | Ht 64.0 in | Wt 157.2 lb

## 2024-01-11 DIAGNOSIS — I10 Essential (primary) hypertension: Secondary | ICD-10-CM

## 2024-01-11 DIAGNOSIS — M5412 Radiculopathy, cervical region: Secondary | ICD-10-CM

## 2024-01-11 DIAGNOSIS — E538 Deficiency of other specified B group vitamins: Secondary | ICD-10-CM

## 2024-01-11 DIAGNOSIS — E1140 Type 2 diabetes mellitus with diabetic neuropathy, unspecified: Secondary | ICD-10-CM | POA: Diagnosis not present

## 2024-01-11 NOTE — Progress Notes (Unsigned)
Location:  [facility]  Clinic (12)  Provider:   Code Status: *** Goals of Care:     01/11/2024   10:20 AM  Advanced Directives  Does Patient Have a Medical Advance Directive? Yes  Type of Estate agent of [place];Living will;Out of facility DNR (pink MOST or yellow form)     Chief Complaint  Patient presents with   Medical Management of Chronic Issues    Follow up. Discuss the need for foot exam, flu and covid.    HPI: Patient is a 89 y.o. male seen today for medical management of chronic diseases.     Past Medical History:  Diagnosis Date   Abdominal pain, other specified site    Allergic rhinitis due to pollen    Benign paroxysmal positional vertigo    Disturbance of skin sensation    left great toe   Elevated prostate specific antigen (PSA)    Hematuria, unspecified    Hypertrophy of prostate without urinary obstruction and other lower urinary tract symptoms (LUTS)    Impotence of organic origin    Left knee pain 05/28/2014   Macrocytosis    Other and unspecified hyperlipidemia    Other malaise and fatigue    Spermatocele    bilateral   Type II or unspecified type diabetes mellitus without mention of complication, uncontrolled    Unspecified essential hypertension    Unspecified glaucoma(365.9)     Past Surgical History:  Procedure Laterality Date   CATARACT EXTRACTION EXTRACAPSULAR  2008   bilateraly Dr. Dione Booze   COLONOSCOPY  01/03/2002   normal Dr. Kinnie Scales   PROSTATE BIOPSY  1995   due to elevated PSA normal   TONSILLECTOMY      Allergies  Allergen Reactions   Metformin And Related Other (See Comments)    unknown   Sulfa Antibiotics Swelling   Tetanus Toxoids Other (See Comments)    unknown   Typhoid Vaccines Other (See Comments)    unknown    Outpatient Encounter Medications as of 01/11/2024  Medication Sig   aspirin 81 MG EC tablet Take 81 mg by mouth daily.   pioglitazone (ACTOS) 30 MG  tablet Take 1 tablet (30 mg total) by mouth daily.   sitaGLIPtin (JANUVIA) 100 MG tablet TAKE 1 TABLET IN THE MORNING TO CONTROL DIABETES.   No facility-administered encounter medications on file as of 01/11/2024.    Review of Systems:  Review of Systems  Health Maintenance  Topic Date Due   FOOT EXAM  03/04/2023   INFLUENZA VACCINE  07/21/2023   COVID-19 Vaccine (7 - 2024-25 season) 08/21/2023   OPHTHALMOLOGY EXAM  03/23/2024   Medicare Annual Wellness (AWV)  04/03/2024   HEMOGLOBIN A1C  07/04/2024   Pneumonia Vaccine 5+ Years old  Completed   HPV VACCINES  Aged Out   DTaP/Tdap/Td  Discontinued   Zoster Vaccines- Shingrix  Discontinued    Physical Exam: Vitals:   01/11/24 0858  BP: 128/70  Pulse: 89  Temp: 98.7 F (37.1 C)  SpO2: 92%  Weight: 157 lb 3.2 oz (71.3 kg)  Height: 5\' 4"  (1.626 m)   Body mass index is 26.98 kg/m. Physical Exam  Labs reviewed: Basic Metabolic Panel: Recent Labs    02/24/23 0812 09/01/23 0800 01/05/24 0810  NA 140 140 141  K 4.0 4.0 4.2  CL 105 102 105  CO2 31 29 28   GLUCOSE 141* 137* 164*  BUN 20 17 22   CREATININE 0.77  0.68* 0.73  CALCIUM 8.6 8.8 8.5*  TSH 2.99 1.97  --    Liver Function Tests: Recent Labs    02/24/23 0812 09/01/23 0800 01/05/24 0810  AST 13 14 14   ALT 9 7* 9  BILITOT 1.0 1.1 0.8  PROT 6.2 6.5 6.2   No results for input(s): "LIPASE", "AMYLASE" in the last 8760 hours. No results for input(s): "AMMONIA" in the last 8760 hours. CBC: Recent Labs    02/24/23 0812 09/01/23 0800 01/05/24 0810  WBC 6.5 10.9* 5.0  NEUTROABS 3,608 8,295* 2,980  HGB 12.4* 12.8* 12.4*  HCT 37.2* 37.4* 37.0*  MCV 101.1* 101.4* 102.5*  PLT 288 339 290   Lipid Panel: Recent Labs    01/05/24 0810  CHOL 141  HDL 60  LDLCALC 66  TRIG 69  CHOLHDL 2.4   Lab Results  Component Value Date   HGBA1C 8.2 (H) 01/05/2024    Procedures since last visit: No results found.  Assessment/Plan There are no diagnoses linked  to this encounter.   Labs/tests ordered:  * No order type specified * Next appt:  Visit date not found

## 2024-02-16 ENCOUNTER — Other Ambulatory Visit: Payer: Self-pay | Admitting: Internal Medicine

## 2024-02-16 DIAGNOSIS — E1140 Type 2 diabetes mellitus with diabetic neuropathy, unspecified: Secondary | ICD-10-CM

## 2024-02-24 ENCOUNTER — Ambulatory Visit: Payer: Medicare Other | Admitting: Podiatry

## 2024-02-27 ENCOUNTER — Encounter: Admitting: Orthopedic Surgery

## 2024-02-28 DIAGNOSIS — E119 Type 2 diabetes mellitus without complications: Secondary | ICD-10-CM | POA: Diagnosis not present

## 2024-02-28 DIAGNOSIS — H903 Sensorineural hearing loss, bilateral: Secondary | ICD-10-CM | POA: Diagnosis not present

## 2024-03-12 ENCOUNTER — Ambulatory Visit: Payer: Self-pay

## 2024-03-12 NOTE — Telephone Encounter (Signed)
  Chief Complaint: blister Symptoms: blister on right foot Frequency: couple days Pertinent Negatives: Patient denies fever Disposition: [] ED /[] Urgent Care (no appt availability in office) / [x] Appointment(In office/virtual)/ []  [facility]/ [] Home Care/ [] Refused Recommended Disposition /[] [facility]/ []  Follow-up with PCP Additional Notes: Patient states that he noticed he foot was irritated between this right pinky and fourth toe a couple days ago so he put cotton between the toes and now there is a blister and it hurts when he walks. No pain when sitting but 8/10 when walking. Doesn't interfere with him wearing shoes. Would like for Dr. Chales Abrahams to take a look at it when she is there on Wednesday.    Reason for Disposition  [1] Ulcer, wound, blister, sore, or red area AND [2] new or increasing  Answer Assessment - Initial Assessment Questions 1. SYMPTOM: "What's the main symptom you're concerned about?" (e.g., rash, sore, callus, drainage, numbness)     blister 2. LOCATION: "Where is the  blister located?" (e.g., foot/toe, top/bottom, left/right)     Right foot  between pink toe and the fourth toe 3. APPEARANCE: "What does the area look like?" (e.g., normal, red, swollen; size)     Can't really see 4. ONSET: "When did the  blister  start?"     A couple days 5. PAIN: "Is there any pain?" If Yes, ask: "How bad is it?" (Scale: 1-10; mild, moderate, severe)     No pain sitting, 8/10 when walking 6. CAUSE: "What do you think is causing the symptoms?"     unknown 7. OTHER SYMPTOMS: "Do you have any other symptoms?" (e.g., fever, weakness)     none  Protocols used: Diabetes - Foot Problems and Questions-A-AH

## 2024-03-12 NOTE — Telephone Encounter (Signed)
 Noted.

## 2024-03-14 ENCOUNTER — Encounter: Payer: Self-pay | Admitting: Internal Medicine

## 2024-03-14 ENCOUNTER — Ambulatory Visit: Admitting: Internal Medicine

## 2024-03-14 VITALS — BP 122/88 | HR 96 | Temp 96.3°F | Resp 18 | Wt 157.6 lb

## 2024-03-14 DIAGNOSIS — E1140 Type 2 diabetes mellitus with diabetic neuropathy, unspecified: Secondary | ICD-10-CM | POA: Diagnosis not present

## 2024-03-14 DIAGNOSIS — L03818 Cellulitis of other sites: Secondary | ICD-10-CM

## 2024-03-14 MED ORDER — CEPHALEXIN 500 MG PO CAPS: 500.0000 mg | ORAL_CAPSULE | Freq: Three times a day (TID) | ORAL | 0 refills | Status: AC

## 2024-03-14 NOTE — Progress Notes (Signed)
 Location:  [facility] Service:   Clinic  Provider:   Code Status: DNR Goals of Care:     03/14/2024    8:28 AM  Advanced Directives  Does Patient Have a Medical Advance Directive? Yes  Type of Estate agent of [place];Living will;Out of facility DNR (pink MOST or yellow form)  Does patient want to make changes to medical advance directive? No - Patient declined  Copy of Healthcare Power of Attorney in Chart? Yes - validated most recent copy scanned in chart (See row information)  Pre-existing out of facility DNR order (yellow form or pink MOST form) Pink MOST form placed in chart (order not valid for inpatient use)     Chief Complaint  Patient presents with   Acute Visit    blister between toes on right foot    HPI: Patient is a 89 y.o. male seen today for an acute visit for Pain in his Right Foot Lives [place] in [facility]  Patient has h/o Diabetes mellitus, LE edema, Right Frozen Shoulder,And B12 def Post Prandial Dizziness Echo in the past is Normal EF  Cervical Radiculopathy with Left Arm Numbness  Came with pain in Left foot in small toe No injury Pain for last week Hurts when he walks on it     Past Medical History:  Diagnosis Date   Abdominal pain, other specified site    Allergic rhinitis due to pollen    Benign paroxysmal positional vertigo    Disturbance of skin sensation    left great toe   Elevated prostate specific antigen (PSA)    Hematuria, unspecified    Hypertrophy of prostate without urinary obstruction and other lower urinary tract symptoms (LUTS)    Impotence of organic origin    Left knee pain 05/28/2014   Macrocytosis    Other and unspecified hyperlipidemia    Other malaise and fatigue    Spermatocele    bilateral   Type II or unspecified type diabetes mellitus without mention of complication, uncontrolled    Unspecified essential hypertension    Unspecified glaucoma(365.9)     Past Surgical History:   Procedure Laterality Date   CATARACT EXTRACTION EXTRACAPSULAR  2008   bilateraly Dr. Dione Booze   COLONOSCOPY  01/03/2002   normal Dr. Kinnie Scales   PROSTATE BIOPSY  1995   due to elevated PSA normal   TONSILLECTOMY      Allergies  Allergen Reactions   Metformin And Related Other (See Comments)    unknown   Sulfa Antibiotics Swelling   Tetanus Toxoids Other (See Comments)    unknown   Typhoid Vaccines Other (See Comments)    unknown    Outpatient Encounter Medications as of 03/14/2024  Medication Sig   aspirin 81 MG EC tablet Take 81 mg by mouth daily.   cephALEXin (KEFLEX) 500 MG capsule Take 1 capsule (500 mg total) by mouth 3 (three) times daily for 5 days.   pioglitazone (ACTOS) 30 MG tablet Take 1 tablet (30 mg total) by mouth daily.   sitaGLIPtin (JANUVIA) 100 MG tablet TAKE 1 TABLET IN THE MORNING TO CONTROL DIABETES.   No facility-administered encounter medications on file as of 03/14/2024.    Review of Systems:  Review of Systems  Constitutional:  Negative for activity change, appetite change and unexpected weight change.  HENT: Negative.    Respiratory:  Negative for cough and shortness of breath.   Cardiovascular:  Negative for leg swelling.  Gastrointestinal:  Negative for constipation.  Genitourinary:  Negative for frequency.  Musculoskeletal:  Negative for arthralgias, gait problem and myalgias.  Skin: Negative.  Negative for rash.  Neurological:  Negative for dizziness and weakness.  Psychiatric/Behavioral:  Negative for confusion and sleep disturbance.   All other systems reviewed and are negative.   Health Maintenance  Topic Date Due   INFLUENZA VACCINE  07/21/2023   COVID-19 Vaccine (7 - 2024-25 season) 08/21/2023   Medicare Annual Wellness (AWV)  04/03/2024   OPHTHALMOLOGY EXAM  03/23/2024   HEMOGLOBIN A1C  07/04/2024   FOOT EXAM  01/10/2025   Pneumonia Vaccine 44+ Years old  Completed   HPV VACCINES  Aged Out   DTaP/Tdap/Td  Discontinued   Zoster  Vaccines- Shingrix  Discontinued    Physical Exam: Vitals:   03/14/24 0817  BP: 122/88  Pulse: 96  Resp: 18  Temp: (!) 96.3 F (35.7 C)  SpO2: 97%  Weight: 157 lb 9.6 oz (71.5 kg)   Body mass index is 27.05 kg/m. Physical Exam Vitals reviewed.  Constitutional:      Appearance: Normal appearance.  HENT:     Head: Normocephalic.     Nose: Nose normal.     Mouth/Throat:     Mouth: Mucous membranes are moist.     Pharynx: Oropharynx is clear.  Eyes:     Pupils: Pupils are equal, round, and reactive to light.  Cardiovascular:     Rate and Rhythm: Normal rate and regular rhythm.     Pulses: Normal pulses.     Heart sounds: No murmur heard. Pulmonary:     Effort: Pulmonary effort is normal. No respiratory distress.     Breath sounds: Normal breath sounds. No rales.  Abdominal:     General: Abdomen is flat. Bowel sounds are normal.     Palpations: Abdomen is soft.  Musculoskeletal:        General: No swelling.     Cervical back: Neck supple.     Comments: Right small toe  Between two toes he has small area which is red and very Tender The toe itself is not swollen or tender  Skin:    General: Skin is warm.  Neurological:     General: No focal deficit present.     Mental Status: He is alert and oriented to person, place, and time.  Psychiatric:        Mood and Affect: Mood normal.        Thought Content: Thought content normal.     Labs reviewed: Basic Metabolic Panel: Recent Labs    09/01/23 0800 01/05/24 0810  NA 140 141  K 4.0 4.2  CL 102 105  CO2 29 28  GLUCOSE 137* 164*  BUN 17 22  CREATININE 0.68* 0.73  CALCIUM 8.8 8.5*  TSH 1.97  --    Liver Function Tests: Recent Labs    09/01/23 0800 01/05/24 0810  AST 14 14  ALT 7* 9  BILITOT 1.1 0.8  PROT 6.5 6.2   No results for input(s): "LIPASE", "AMYLASE" in the last 8760 hours. No results for input(s): "AMMONIA" in the last 8760 hours. CBC: Recent Labs    09/01/23 0800 01/05/24 0810  WBC  10.9* 5.0  NEUTROABS 8,295* 2,980  HGB 12.8* 12.4*  HCT 37.4* 37.0*  MCV 101.4* 102.5*  PLT 339 290   Lipid Panel: Recent Labs    01/05/24 0810  CHOL 141  HDL 60  LDLCALC 66  TRIG 69  CHOLHDL 2.4  Lab Results  Component Value Date   HGBA1C 8.2 (H) 01/05/2024    Procedures since last visit: No results found.  Assessment/Plan 1. Cellulitis of Little toe of Right Foot (Primary) Keflex 500 mg TID for 5 days To see Podiatrist in house Follow up in 1 week 2. Type 2 diabetes mellitus with diabetic neuropathy, without long-term current use of insulin (HCC) Repeat Labs are pending    Labs/tests ordered:  * No order type specified * Next appt:  03/19/2024

## 2024-03-19 ENCOUNTER — Other Ambulatory Visit

## 2024-03-21 ENCOUNTER — Ambulatory Visit: Payer: Self-pay

## 2024-03-21 ENCOUNTER — Ambulatory Visit: Admitting: Internal Medicine

## 2024-03-21 ENCOUNTER — Encounter: Payer: Self-pay | Admitting: Internal Medicine

## 2024-03-21 VITALS — BP 98/60 | HR 82 | Temp 97.0°F | Resp 16 | Ht 64.0 in | Wt 158.2 lb

## 2024-03-21 DIAGNOSIS — L84 Corns and callosities: Secondary | ICD-10-CM

## 2024-03-21 DIAGNOSIS — E1140 Type 2 diabetes mellitus with diabetic neuropathy, unspecified: Secondary | ICD-10-CM | POA: Diagnosis not present

## 2024-03-21 NOTE — Progress Notes (Unsigned)
 Location:  [facility] curator of Service:   Clinic  Provider:   Code Status:  Goals of Care:     03/14/2024    8:28 AM  Advanced Directives  Does Patient Have a Medical Advance Directive? Yes  Type of Estate agent of [place];Living will;Out of facility DNR (pink MOST or yellow form)  Does patient want to make changes to medical advance directive? No - Patient declined  Copy of Healthcare Power of Attorney in Chart? Yes - validated most recent copy scanned in chart (See row information)  Pre-existing out of facility DNR order (yellow form or pink MOST form) Pink MOST form placed in chart (order not valid for inpatient use)     Chief Complaint  Patient presents with   Toe Pain    Patient complains of toe pain on right foot.     HPI: Patient is a 89 y.o. male seen today for an acute visit for Toe Pain in the Right Foot  [place]ives [place] in [facility]  Patient has h/o Diabetes mellitus, LE edema, Right Frozen Shoulder,And B12 def Post Prandial Dizziness Echo in the past is Normal EF  Cervical Radiculopathy with Left Arm Numbness   Came with pain in Left foot in small toe No injury Pain for last week Hurts when he walks on it  I thought there was some redness around his Callus in between his Toes Treated with one week of Keflex Patient here today and says his pain is slightly better but still Hurts when he walks  Past Medical History:  Diagnosis Date   Abdominal pain, other specified site    Allergic rhinitis due to pollen    Benign paroxysmal positional vertigo    Disturbance of skin sensation    left great toe   Elevated prostate specific antigen (PSA)    Hematuria, unspecified    Hypertrophy of prostate without urinary obstruction and other lower urinary tract symptoms (LUTS)    Impotence of organic origin    Left knee pain 05/28/2014   Macrocytosis    Other and unspecified hyperlipidemia    Other malaise and fatigue    Spermatocele     bilateral   Type II or unspecified type diabetes mellitus without mention of complication, uncontrolled    Unspecified essential hypertension    Unspecified glaucoma(365.9)     Past Surgical History:  Procedure Laterality Date   CATARACT EXTRACTION EXTRACAPSULAR  2008   bilateraly Dr. Dione Booze   COLONOSCOPY  01/03/2002   normal Dr. Kinnie Scales   PROSTATE BIOPSY  1995   due to elevated PSA normal   TONSILLECTOMY      Allergies  Allergen Reactions   Metformin And Related Other (See Comments)    unknown   Sulfa Antibiotics Swelling   Tetanus Toxoids Other (See Comments)    unknown   Typhoid Vaccines Other (See Comments)    unknown    Outpatient Encounter Medications as of 03/21/2024  Medication Sig   aspirin 81 MG EC tablet Take 81 mg by mouth daily.   pioglitazone (ACTOS) 30 MG tablet Take 1 tablet (30 mg total) by mouth daily.   sitaGLIPtin (JANUVIA) 100 MG tablet TAKE 1 TABLET IN THE MORNING TO CONTROL DIABETES.   No facility-administered encounter medications on file as of 03/21/2024.    Review of Systems:  Review of Systems  Constitutional:  Negative for activity change, appetite change and unexpected weight change.  HENT: Negative.    Respiratory:  Negative for cough and shortness of breath.   Cardiovascular:  Negative for leg swelling.  Gastrointestinal:  Negative for constipation.  Genitourinary:  Negative for frequency.  Musculoskeletal:  Positive for gait problem. Negative for arthralgias and myalgias.  Skin: Negative.  Negative for rash.  Neurological:  Negative for dizziness and weakness.  Psychiatric/Behavioral:  Negative for confusion and sleep disturbance.   All other systems reviewed and are negative.   Health Maintenance  Topic Date Due   COVID-19 Vaccine (7 - 2024-25 season) 08/21/2023   Medicare Annual Wellness (AWV)  04/03/2024   OPHTHALMOLOGY EXAM  03/23/2024   HEMOGLOBIN A1C  07/04/2024   INFLUENZA VACCINE  07/20/2024   FOOT EXAM  01/10/2025    Pneumonia Vaccine 22+ Years old  Completed   HPV VACCINES  Aged Out   DTaP/Tdap/Td  Discontinued   Zoster Vaccines- Shingrix  Discontinued    Physical Exam: Vitals:   03/21/24 1416  BP: 98/60  Pulse: 82  Resp: 16  Temp: (!) 97 F (36.1 C)  SpO2: 94%  Weight: 158 lb 3.2 oz (71.8 kg)  Height: 5\' 4"  (1.626 m)   Body mass index is 27.15 kg/m. Physical Exam Vitals reviewed.  Constitutional:      Appearance: Normal appearance.  HENT:     Head: Normocephalic.     Nose: Nose normal.     Mouth/Throat:     Mouth: Mucous membranes are moist.     Pharynx: Oropharynx is clear.  Eyes:     Pupils: Pupils are equal, round, and reactive to light.  Cardiovascular:     Rate and Rhythm: Normal rate and regular rhythm.     Pulses: Normal pulses.     Heart sounds: No murmur heard. Pulmonary:     Effort: Pulmonary effort is normal. No respiratory distress.     Breath sounds: Normal breath sounds. No rales.  Abdominal:     General: Abdomen is flat. Bowel sounds are normal.     Palpations: Abdomen is soft.  Musculoskeletal:        General: No swelling.     Cervical back: Neck supple.     Comments: Has Painful Callus in between his Right Toes No Redness now OR discharge  Skin:    General: Skin is warm.  Neurological:     General: No focal deficit present.     Mental Status: He is alert and oriented to person, place, and time.  Psychiatric:        Mood and Affect: Mood normal.        Thought Content: Thought content normal.     Labs reviewed: Basic Metabolic Panel: Recent Labs    09/01/23 0800 01/05/24 0810  NA 140 141  K 4.0 4.2  CL 102 105  CO2 29 28  GLUCOSE 137* 164*  BUN 17 22  CREATININE 0.68* 0.73  CALCIUM 8.8 8.5*  TSH 1.97  --    Liver Function Tests: Recent Labs    09/01/23 0800 01/05/24 0810  AST 14 14  ALT 7* 9  BILITOT 1.1 0.8  PROT 6.5 6.2   No results for input(s): "LIPASE", "AMYLASE" in the last 8760 hours. No results for input(s): "AMMONIA"  in the last 8760 hours. CBC: Recent Labs    09/01/23 0800 01/05/24 0810  WBC 10.9* 5.0  NEUTROABS 8,295* 2,980  HGB 12.8* 12.4*  HCT 37.4* 37.0*  MCV 101.4* 102.5*  PLT 339 290   Lipid Panel: Recent Labs    01/05/24 0810  CHOL  141  HDL 60  LDLCALC 66  TRIG 69  CHOLHDL 2.4   Lab Results  Component Value Date   HGBA1C 8.2 (H) 01/05/2024    Procedures since last visit: No results found.  Assessment/Plan 1. Painful Callus between toes (Primary)  - Ambulatory referral to Podiatry  2. Type 2 diabetes mellitus with diabetic neuropathy, without long-term current use of insulin (HCC)  - Ambulatory referral to Podiatry    Labs/tests ordered:  * No order type specified * Next appt:  04/11/2024

## 2024-03-21 NOTE — Telephone Encounter (Signed)
 George [name] called in stating he was seen and evaluated by Dr. Chales Abrahams and placed on antibiotics for cellulitis of his toe. He states he has completed all medications and there has been no improvement. George [name] states he is at [facility] and he believes Dr. Chales Abrahams is supposed to be there today and is asking if she can re-evaluate toe. This RN attempted acute appt with Dr. Chales Abrahams but her schedule would not load in BookIt. This RN contacted Alandra at [facility] via clinical access line and Alanda was able to assist George [name] with appt scheduling.  Copied from CRM [id number]. Topic: Clinical - Red Word Triage >> Mar 21, 2024  8:14 AM Prudencio Pair wrote: Red Word that prompted transfer to Nurse Triage: George [name] states he wants Dr. Chales Abrahams to check his toe on his right foot. He states that it is infected and very sore. Was given antibiotics but he states he's out and they didn't help. States Dr. Chales Abrahams will be there today. Reason for Disposition  [1] Follow-up call from George [name] regarding George [name]'s clinical status AND [2] information NON-URGENT  Answer Assessment - Initial Assessment Questions 1. REASON FOR CALL or QUESTION: "What is your reason for calling today?" or "How can I best help you?" or "What question do you have that I can help answer?"     George [name] requesting re-evaluation of toe from Dr. Chales Abrahams. See notes.  2. CALLER: Document the source of call. (e.g., laboratory, George [name]).     George [name]  Protocols used: PCP Call - No Triage-A-AH

## 2024-03-26 ENCOUNTER — Ambulatory Visit: Admitting: Podiatry

## 2024-03-26 ENCOUNTER — Encounter: Payer: Self-pay | Admitting: Podiatry

## 2024-03-26 DIAGNOSIS — M2041 Other hammer toe(s) (acquired), right foot: Secondary | ICD-10-CM | POA: Diagnosis not present

## 2024-03-26 DIAGNOSIS — L84 Corns and callosities: Secondary | ICD-10-CM | POA: Diagnosis not present

## 2024-03-26 DIAGNOSIS — E1140 Type 2 diabetes mellitus with diabetic neuropathy, unspecified: Secondary | ICD-10-CM

## 2024-03-26 NOTE — Progress Notes (Signed)
 This patient presents to the office with painful corn between his 4/5 toe right foot.  He says it has been painful for weeks.  He says he has purchased soft shoes.  He is diabetic and takes asa daily.  He presents to the office for evaluation and treatment.    General Appearance  Alert, conversant and in no acute stress.  Vascular  Dorsalis pedis and posterior tibial  pulses are palpable  bilaterally.  Capillary return is within normal limits  bilaterally. Temperature is within normal limits  bilaterally.  Neurologic  Senn-Weinstein monofilament wire test within normal limits  bilaterally. Muscle power within normal limits bilaterally.  Nails Normotropic nails  from hallux to fifth toes bilaterally. No evidence of bacterial infection or drainage bilaterally.  Orthopedic  No limitations of motion  feet .  No crepitus or effusions noted.  No bony pathology or digital deformities noted. ADV 5th digits  B/L.    Skin  normotropic skin with no porokeratosis noted bilaterally.  No signs of infections or ulcers noted.  Corn fifth toe right foot. Due to fifth toe positioning.  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  IE.  Discussed this condition with this patient.  Injection therapy  with 0>5 lidocaine and 0>5 kenalog 10.  Padding dispensed  RTC  4 months for nail care.   Helane Gunther DPM

## 2024-03-29 DIAGNOSIS — H401131 Primary open-angle glaucoma, bilateral, mild stage: Secondary | ICD-10-CM | POA: Diagnosis not present

## 2024-03-29 DIAGNOSIS — H0102B Squamous blepharitis left eye, upper and lower eyelids: Secondary | ICD-10-CM | POA: Diagnosis not present

## 2024-03-29 DIAGNOSIS — H0102A Squamous blepharitis right eye, upper and lower eyelids: Secondary | ICD-10-CM | POA: Diagnosis not present

## 2024-03-29 LAB — HM DIABETES EYE EXAM

## 2024-04-02 ENCOUNTER — Other Ambulatory Visit: Payer: Medicare Other

## 2024-04-03 DIAGNOSIS — L821 Other seborrheic keratosis: Secondary | ICD-10-CM | POA: Diagnosis not present

## 2024-04-03 DIAGNOSIS — L814 Other melanin hyperpigmentation: Secondary | ICD-10-CM | POA: Diagnosis not present

## 2024-04-03 DIAGNOSIS — L570 Actinic keratosis: Secondary | ICD-10-CM | POA: Diagnosis not present

## 2024-04-11 ENCOUNTER — Encounter: Payer: Self-pay | Admitting: Internal Medicine

## 2024-04-11 ENCOUNTER — Non-Acute Institutional Stay: Payer: Medicare Other | Admitting: Internal Medicine

## 2024-04-11 VITALS — BP 116/77 | HR 79 | Temp 97.0°F | Resp 18 | Ht 64.0 in | Wt 157.4 lb

## 2024-04-11 DIAGNOSIS — L989 Disorder of the skin and subcutaneous tissue, unspecified: Secondary | ICD-10-CM | POA: Diagnosis not present

## 2024-04-11 DIAGNOSIS — G3184 Mild cognitive impairment, so stated: Secondary | ICD-10-CM | POA: Diagnosis not present

## 2024-04-11 DIAGNOSIS — M25512 Pain in left shoulder: Secondary | ICD-10-CM | POA: Diagnosis not present

## 2024-04-11 DIAGNOSIS — L84 Corns and callosities: Secondary | ICD-10-CM

## 2024-04-11 DIAGNOSIS — G8929 Other chronic pain: Secondary | ICD-10-CM | POA: Insufficient documentation

## 2024-04-11 DIAGNOSIS — M5412 Radiculopathy, cervical region: Secondary | ICD-10-CM

## 2024-04-11 DIAGNOSIS — E1140 Type 2 diabetes mellitus with diabetic neuropathy, unspecified: Secondary | ICD-10-CM | POA: Diagnosis not present

## 2024-04-11 NOTE — Progress Notes (Unsigned)
 [facility]  [facility] of Service:  Clinic (12)  Provider:   Code Status: DNR Goals of Care:     04/11/2024    2:00 PM  Advanced Directives  Does Patient Have a Medical Advance Directive? Yes  Type of Estate agent of [place];Living will;Out of facility DNR (pink MOST or yellow form)  Does patient want to make changes to medical advance directive? No - Patient declined  Copy of Healthcare Power of Attorney in Chart? Yes - validated most recent copy scanned in chart (See row information)  Pre-existing out of facility DNR order (yellow form or pink MOST form) Pink MOST form placed in chart (order not valid for inpatient use)     Chief Complaint  Patient presents with   Medical Management of Chronic Issues    3 month follow up with labs     HPI: Patient is a 89 y.o. male seen today for medical management of chronic diseases.   [place] in [facility]  Wife passed away few months ago  Patient has h/o Diabetes mellitus, LE edema, Right Frozen Shoulder,And B12 def Post Prandial Dizziness Echo in the past is Normal EF  Cervical Radiculopathy with Left Arm Numbness      Discussed the use of AI scribe software for clinical note transcription with the patient, who gave verbal consent to proceed.  History of Present Illness   The patient, a 89 year old with a history of diabetes, presents with a sore toe and a skin lesion on his forehead. The toe discomfort began after a corn was trimmed, and despite using a rubber cover, the pain persists, particularly when walking. However, the patient states he can tolerate the discomfort.  The skin lesion on the forehead was previously evaluated by a dermatologist who recommended freezing the lesion. The patient declined further treatment due to discomfort from previous treatments. The patient reports no itching or other symptoms from the lesion and prefers to monitor it for changes.  The patient also mentions a  history of diabetes, which was slightly uncontrolled at the last check with a slightly elevated A1c. He admits to a previous habit of eating a lot of candy but has since stopped. The patient is currently on Januvia  and Actos  for diabetes management.  The patient lives in an assisted living facility and is very active, participating in various activities such as bingo and puzzles. He reports good appetite and sleep. He has a supportive network of friends in the facility and a daughter who checks on him regularly.       History of Present Illness     Past Medical History:  Diagnosis Date   Abdominal pain, other specified site    Allergic rhinitis due to pollen    Benign paroxysmal positional vertigo    Disturbance of skin sensation    left great toe   Elevated prostate specific antigen (PSA)    Hematuria, unspecified    Hypertrophy of prostate without urinary obstruction and other lower urinary tract symptoms (LUTS)    Impotence of organic origin    Left knee pain 05/28/2014   Macrocytosis    Other and unspecified hyperlipidemia    Other malaise and fatigue    Spermatocele    bilateral   Type II or unspecified type diabetes mellitus without mention of complication, uncontrolled    Unspecified essential hypertension    Unspecified glaucoma(365.9)     Past Surgical History:  Procedure Laterality Date  CATARACT EXTRACTION EXTRACAPSULAR  2008   bilateraly Dr. Candi Chafe   COLONOSCOPY  01/03/2002   normal Dr. Andriette Keeling   PROSTATE BIOPSY  1995   due to elevated PSA normal   TONSILLECTOMY      Allergies  Allergen Reactions   Metformin And Related Other (See Comments)    unknown   Sulfa Antibiotics Swelling   Tetanus Toxoids Other (See Comments)    unknown   Typhoid Vaccines Other (See Comments)    unknown    Outpatient Encounter Medications as of 04/11/2024  Medication Sig   aspirin 81 MG EC tablet Take 81 mg by mouth daily.   pioglitazone  (ACTOS ) 30 MG tablet Take 1 tablet (30 mg  total) by mouth daily.   sitaGLIPtin  (JANUVIA ) 100 MG tablet TAKE 1 TABLET IN THE MORNING TO CONTROL DIABETES.   No facility-administered encounter medications on file as of 04/11/2024.    Review of Systems:  Review of Systems  Constitutional:  Negative for activity change, appetite change and unexpected weight change.  HENT: Negative.    Respiratory:  Negative for cough and shortness of breath.   Cardiovascular:  Negative for leg swelling.  Gastrointestinal:  Negative for constipation.  Genitourinary:  Negative for frequency.  Musculoskeletal:  Positive for gait problem. Negative for arthralgias and myalgias.  Skin: Negative.  Negative for rash.  Neurological:  Negative for dizziness and weakness.  Psychiatric/Behavioral:  Negative for confusion and sleep disturbance.   All other systems reviewed and are negative.   Health Maintenance  Topic Date Due   COVID-19 Vaccine (7 - 2024-25 season) 08/21/2023   Medicare Annual Wellness (AWV)  04/03/2024   HEMOGLOBIN A1C  07/04/2024   INFLUENZA VACCINE  07/20/2024   FOOT EXAM  01/10/2025   OPHTHALMOLOGY EXAM  03/29/2025   Pneumonia Vaccine 72+ Years old  Completed   HPV VACCINES  Aged Out   Meningococcal B Vaccine  Aged Out   DTaP/Tdap/Td  Discontinued   Zoster Vaccines- Shingrix  Discontinued    Physical Exam: Vitals:   04/11/24 1358  BP: 116/77  Pulse: 79  Resp: 18  Temp: (!) 97 F (36.1 C)  SpO2: 95%  Weight: 157 lb 6.4 oz (71.4 kg)  Height: 5\' 4"  (1.626 m)   Body mass index is 27.02 kg/m. Physical Exam Vitals reviewed.  Constitutional:      Appearance: Normal appearance.  HENT:     Head: Normocephalic.     Nose: Nose normal.     Mouth/Throat:     Mouth: Mucous membranes are moist.     Pharynx: Oropharynx is clear.  Eyes:     Pupils: Pupils are equal, round, and reactive to light.  Cardiovascular:     Rate and Rhythm: Normal rate and regular rhythm.     Pulses: Normal pulses.     Heart sounds: No murmur  heard. Pulmonary:     Effort: Pulmonary effort is normal. No respiratory distress.     Breath sounds: Normal breath sounds. No rales.  Abdominal:     General: Abdomen is flat. Bowel sounds are normal.     Palpations: Abdomen is soft.  Musculoskeletal:        General: No swelling.     Cervical back: Neck supple.  Skin:    General: Skin is warm.     Comments: Small Spot on his Scalp  Neurological:     General: No focal deficit present.     Mental Status: He is alert and oriented to person, place, and  time.  Psychiatric:        Mood and Affect: Mood normal.        Thought Content: Thought content normal.     Labs reviewed: Basic Metabolic Panel: Recent Labs    09/01/23 0800 01/05/24 0810  NA 140 141  K 4.0 4.2  CL 102 105  CO2 29 28  GLUCOSE 137* 164*  BUN 17 22  CREATININE 0.68* 0.73  CALCIUM 8.8 8.5*  TSH 1.97  --    Liver Function Tests: Recent Labs    09/01/23 0800 01/05/24 0810  AST 14 14  ALT 7* 9  BILITOT 1.1 0.8  PROT 6.5 6.2   No results for input(s): "LIPASE", "AMYLASE" in the last 8760 hours. No results for input(s): "AMMONIA" in the last 8760 hours. CBC: Recent Labs    09/01/23 0800 01/05/24 0810  WBC 10.9* 5.0  NEUTROABS 8,295* 2,980  HGB 12.8* 12.4*  HCT 37.4* 37.0*  MCV 101.4* 102.5*  PLT 339 290   Lipid Panel: Recent Labs    01/05/24 0810  CHOL 141  HDL 60  LDLCALC 66  TRIG 69  CHOLHDL 2.4   Lab Results  Component Value Date   HGBA1C 8.2 (H) 01/05/2024    Procedures since last visit: No results found.  Assessment/Plan      Elevated A1c A1c elevated in January, reduced candy intake, no hyperglycemia symptoms. - Recheck A1c in July. - Consider medication adjustment if A1c remains elevated.  Lesion on forehead Lesion evaluated by dermatologist, declined treatment, no symptoms, opted to monitor. - Monitor lesion for changes. - Review lesion in three months, consider dermatology referral if changes occur. - Document  lesion with photograph for future comparison.  Sore toe with corn Sore toe with corn, pain persists but tolerable. - Continue using rubber protector for corn. - Monitor for any changes in pain or condition. Cognitive changes MMSE 26/30 in 04/24 Will repeat in next visit  Lower extremity numbness/tingling Chronic, likely secondary to diabetes. No associated pain or functional impairment.    Macrocytosis Stable HGB B 12 Normal Level   Labs/tests ordered:  * No order type specified * Next appt:  Visit date not found

## 2024-04-11 NOTE — Patient Instructions (Signed)
 Lab work done on July 21 st at [facility] at 7:45 am

## 2024-04-24 ENCOUNTER — Other Ambulatory Visit: Payer: Self-pay | Admitting: Internal Medicine

## 2024-04-24 ENCOUNTER — Ambulatory Visit: Payer: Self-pay

## 2024-04-24 DIAGNOSIS — R310 Gross hematuria: Secondary | ICD-10-CM | POA: Diagnosis not present

## 2024-04-24 DIAGNOSIS — E119 Type 2 diabetes mellitus without complications: Secondary | ICD-10-CM

## 2024-04-24 NOTE — Telephone Encounter (Signed)
  Chief Complaint: blood in urine Symptoms: blood streak Frequency: yesterday Pertinent Negatives: Patient denies fever, injuries, back/flank pain Disposition: [] ED /[x] Urgent Care (no appt availability in office) / [] Appointment(In office/virtual)/ []  [facility]/ [] Home Care/ [] Refused Recommended Disposition /[] [facility]/ [x]  Follow-up with PCP Additional Notes: Pt c/o blood streak in urine noticed yesterday with every output. Pt denies any pain, fever, injuries. Pt requesting to see PCP at senior living facility at [facility], but triager unable to schedule per protocol d/t no access. Triager called [facility] and spoke to Bambi Lever with no availabilities tomorrow either. Triager notified pt of disposition and if pt transportation accommodations for alternate appt could be made. Pt said he would find other means. Patient verbalized understanding and to call back with worsening symptoms.    Copied from CRM [id number]. Topic: Clinical - Red Word Triage >> Apr 24, 2024  8:15 AM Hamdi H wrote: Red Word that prompted transfer to Nurse Triage: Blood in urine started yesterday. Reason for Disposition  Blood in urine  (Exception: Could be normal menstrual bleeding.)  Answer Assessment - Initial Assessment Questions 1. COLOR of URINE: "Describe the color of the urine."  (e.g., tea-colored, pink, red, bloody) "Do you have blood clots in your urine?" (e.g., none, pea, grape, small coin)     "A little streak" of blood when I urinate 2. ONSET: "When did the bleeding start?"      yesterday 3. EPISODES: "How many times has there been blood in the urine?" or "How many times today?"     Every output yesterday 4. PAIN with URINATION: "Is there any pain with passing your urine?" If Yes, ask: "How bad is the pain?"  (Scale 1-10; or mild, moderate, severe)    - MILD: Complains slightly about urination hurting.    - MODERATE: Interferes with normal activities.      - SEVERE: Excruciating,  unwilling or unable to urinate because of the pain.      none 5. FEVER: "Do you have a fever?" If Yes, ask: "What is your temperature, how was it measured, and when did it start?"     denies 6. ASSOCIATED SYMPTOMS: "Are you passing urine more frequently than usual?"     denies 7. OTHER SYMPTOMS: "Do you have any other symptoms?" (e.g., back/flank pain, abdomen pain, vomiting)     denies  Protocols used: Urine - Blood In-A-AH

## 2024-04-24 NOTE — Telephone Encounter (Signed)
  Duplicate, erroneous encounter. Patient and staff at facility have spoke with nurse triage twice today and with clinic staff.  Copied From CRM [id number]. Reason for Triage: Larinda Plover the nurse at the patient living facility [facility]) states the patient has blood in his urine. Patient wasn't with the nurse at the time of the call. Patient can be reached at [phone]  This encounter was created in error - please disregard.

## 2024-04-24 NOTE — Telephone Encounter (Signed)
 RN spoke to [place] at [facility]. RN advised Bearl Botts that this patient spoke to a different nurse earlier this AM and was triaged for his symptoms. Bearl Botts confirmed the pt is not experiencing new or worsening symptoms. Please see other Nurse Triage encounter.  RN advised Dominique Dr. Venice Gillis has no availability tomorrow at [facility] and that the pt should go to UC today. Dominique verbalized understanding. Then, pt received a call from an office staff member at [facility]. Pt currently speaking on the phone with [facility].   Copied from CRM [id number]. Topic: Clinical - Red Word Triage >> Apr 24, 2024  8:45 AM Blair Bumpers wrote: Red Word that prompted transfer to Nurse Triage: Patient states he has blood in his urine. Wants to see Dr. Venice Gillis at [facility] tomorrow. Reason for Disposition  Requesting regular office appointment  Answer Assessment - Initial Assessment Questions 1. REASON FOR CALL or QUESTION: "What is your reason for calling today?" or "How can I best help you?" or "What question do you have that I can help answer?"     Pt was already triaged this AM for hematuria and has no new or worsening symptoms. Pt called to schedule appt with Dr Venice Gillis for tomorrow but was already advised she has no availability.  Protocols used: Information Only Call - No Triage-A-AH

## 2024-04-24 NOTE — Telephone Encounter (Signed)
 E2C2 states that PCP Marguerite Shiley, MD has no access. I dont think they're aware patient can come to this office or go to [facility] campus to be seen. Can someone contact the patient and see if he would rather do that instead of going to Urgent Care or waiting until July? Message routed to front admin.

## 2024-04-24 NOTE — Telephone Encounter (Signed)
Sending to PCP as a FYI.

## 2024-05-01 DIAGNOSIS — L602 Onychogryphosis: Secondary | ICD-10-CM | POA: Diagnosis not present

## 2024-05-01 DIAGNOSIS — E1159 Type 2 diabetes mellitus with other circulatory complications: Secondary | ICD-10-CM | POA: Diagnosis not present

## 2024-05-01 DIAGNOSIS — L84 Corns and callosities: Secondary | ICD-10-CM | POA: Diagnosis not present

## 2024-05-02 DIAGNOSIS — N401 Enlarged prostate with lower urinary tract symptoms: Secondary | ICD-10-CM | POA: Diagnosis not present

## 2024-05-02 DIAGNOSIS — R3912 Poor urinary stream: Secondary | ICD-10-CM | POA: Diagnosis not present

## 2024-05-02 DIAGNOSIS — R310 Gross hematuria: Secondary | ICD-10-CM | POA: Diagnosis not present

## 2024-06-11 ENCOUNTER — Emergency Department (HOSPITAL_COMMUNITY)

## 2024-06-11 ENCOUNTER — Emergency Department (HOSPITAL_COMMUNITY)
Admission: EM | Admit: 2024-06-11 | Discharge: 2024-06-11 | Disposition: A | Discharge status: Home / Self Care | Attending: Emergency Medicine | Admitting: Emergency Medicine

## 2024-06-11 ENCOUNTER — Other Ambulatory Visit: Payer: Self-pay

## 2024-06-11 ENCOUNTER — Encounter (HOSPITAL_COMMUNITY): Payer: Self-pay

## 2024-06-11 DIAGNOSIS — K625 Hemorrhage of anus and rectum: Secondary | ICD-10-CM | POA: Diagnosis not present

## 2024-06-11 DIAGNOSIS — R58 Hemorrhage, not elsewhere classified: Secondary | ICD-10-CM | POA: Diagnosis not present

## 2024-06-11 DIAGNOSIS — Z7982 Long term (current) use of aspirin: Secondary | ICD-10-CM | POA: Insufficient documentation

## 2024-06-11 DIAGNOSIS — E119 Type 2 diabetes mellitus without complications: Secondary | ICD-10-CM | POA: Insufficient documentation

## 2024-06-11 DIAGNOSIS — I10 Essential (primary) hypertension: Secondary | ICD-10-CM | POA: Diagnosis not present

## 2024-06-11 DIAGNOSIS — R42 Dizziness and giddiness: Secondary | ICD-10-CM | POA: Diagnosis not present

## 2024-06-11 LAB — CBC WITH DIFFERENTIAL/PLATELET
Abs Immature Granulocytes: 0.02 10*3/uL (ref 0.00–0.07)
Basophils Absolute: 0.1 10*3/uL (ref 0.0–0.1)
Basophils Relative: 1 %
Eosinophils Absolute: 0 10*3/uL (ref 0.0–0.5)
Eosinophils Relative: 1 %
HCT: 39 % (ref 39.0–52.0)
Hemoglobin: 12.9 g/dL — ABNORMAL LOW (ref 13.0–17.0)
Immature Granulocytes: 0 %
Lymphocytes Relative: 14 %
Lymphs Abs: 1 10*3/uL (ref 0.7–4.0)
MCH: 34.5 pg — ABNORMAL HIGH (ref 26.0–34.0)
MCHC: 33.1 g/dL (ref 30.0–36.0)
MCV: 104.3 fL — ABNORMAL HIGH (ref 80.0–100.0)
Monocytes Absolute: 0.9 10*3/uL (ref 0.1–1.0)
Monocytes Relative: 12 %
Neutro Abs: 5.2 10*3/uL (ref 1.7–7.7)
Neutrophils Relative %: 72 %
Platelets: 295 10*3/uL (ref 150–400)
RBC: 3.74 MIL/uL — ABNORMAL LOW (ref 4.22–5.81)
RDW: 22.5 % — ABNORMAL HIGH (ref 11.5–15.5)
WBC: 7.2 10*3/uL (ref 4.0–10.5)
nRBC: 0 % (ref 0.0–0.2)

## 2024-06-11 LAB — COMPREHENSIVE METABOLIC PANEL WITH GFR
ALT: 12 U/L (ref 0–44)
AST: 21 U/L (ref 15–41)
Albumin: 3.8 g/dL (ref 3.5–5.0)
Alkaline Phosphatase: 71 U/L (ref 38–126)
Anion gap: 12 (ref 5–15)
BUN: 15 mg/dL (ref 8–23)
CO2: 23 mmol/L (ref 22–32)
Calcium: 8.7 mg/dL — ABNORMAL LOW (ref 8.9–10.3)
Chloride: 100 mmol/L (ref 98–111)
Creatinine, Ser: 0.73 mg/dL (ref 0.61–1.24)
GFR, Estimated: >60 mL/min (ref >60)
Glucose, Bld: 145 mg/dL — ABNORMAL HIGH (ref 70–99)
Potassium: 3.8 mmol/L (ref 3.5–5.1)
Sodium: 135 mmol/L (ref 135–145)
Total Bilirubin: 0.9 mg/dL (ref 0.0–1.2)
Total Protein: 6.2 g/dL — ABNORMAL LOW (ref 6.5–8.1)

## 2024-06-11 LAB — POC OCCULT BLOOD, ED: Fecal Occult Bld: POSITIVE — AB

## 2024-06-11 LAB — PROTIME-INR
INR: 1.1 (ref 0.8–1.2)
Prothrombin Time: 14 s (ref 11.4–15.2)

## 2024-06-11 MED ORDER — SODIUM CHLORIDE 0.9 % IV BOLUS
500.0000 mL | Freq: Once | INTRAVENOUS | Status: AC
  Administered 2024-06-11: 500 mL via INTRAVENOUS

## 2024-06-11 NOTE — Discharge Instructions (Signed)
 Follow-up with [facility] in the next week or 2.  Return sooner if any problems.  Stop taking your aspirin until you are seen in follow-up by the doctor

## 2024-06-11 NOTE — ED Provider Notes (Signed)
 [place] EMERGENCY DEPARTMENT AT [facility] Provider Note   CSN: [id number] Arrival date & time: 06/11/24  1104     Patient presents with: Rectal Bleeding   George [name] is a 89 y.o. male.  {Add pertinent medical, surgical, social history, OB history to YEP:67052} Patient states he felt some bleeding from his rectum when he was in the shower.  Patient has a history of diabetes   Rectal Bleeding      Prior to Admission medications   Medication Sig Start Date End Date Taking? Authorizing Provider  aspirin 81 MG EC tablet Take 81 mg by mouth daily.   Yes [provider]  Multiple Vitamins-Minerals (PRESERVISION AREDS PO) Take 1 capsule by mouth daily.   Yes [provider]  pioglitazone  (ACTOS ) 30 MG tablet Take 1 tablet (30 mg total) by mouth daily. 09/14/23  Yes Charlanne Fredia CROME, MD  sitaGLIPtin  (JANUVIA ) 100 MG tablet TAKE 1 TABLET IN THE MORNING TO CONTROL DIABETES. 04/24/24  Yes Gupta, Anjali L, MD    Allergies: Metformin and related, Sulfa antibiotics, Tetanus toxoids, and Typhoid vaccines    Review of Systems  Gastrointestinal:  Positive for hematochezia.    Updated Vital Signs BP (!) 109/90 (BP Location: Left Arm)   Pulse 79   Temp 98 F (36.7 C)   Resp (!) 23   SpO2 100%   Physical Exam  (all labs ordered are listed, but only abnormal results are displayed) Labs Reviewed  CBC WITH DIFFERENTIAL/PLATELET - Abnormal; Notable for the following components:      Result Value   RBC 3.74 (*)    Hemoglobin 12.9 (*)    MCV 104.3 (*)    MCH 34.5 (*)    RDW 22.5 (*)    All other components within normal limits  COMPREHENSIVE METABOLIC PANEL WITH GFR - Abnormal; Notable for the following components:   Glucose, Bld 145 (*)    Calcium 8.7 (*)    Total Protein 6.2 (*)    All other components within normal limits  POC OCCULT BLOOD, ED - Abnormal; Notable for the following components:   Fecal Occult Bld POSITIVE (*)    All other  components within normal limits  PROTIME-INR  POC OCCULT BLOOD, ED    EKG: EKG Interpretation Date/Time:  Monday June 11 2024 12:53:36 EDT Ventricular Rate:  67 PR Interval:  184 QRS Duration:  98 QT Interval:  410 QTC Calculation: 433 R Axis:   62  Text Interpretation: Sinus rhythm Confirmed by Suzette Pac [phone]) on 06/11/2024 2:47:12 PM  Radiology: [facility] Chest Port 1 View Result Date: 06/11/2024 CLINICAL DATA:  Rectal bleeding. EXAM: PORTABLE CHEST 1 VIEW COMPARISON:  June 24, 2017 FINDINGS: The heart size and mediastinal contours are within normal limits. There is marked severity calcification of the thoracic aorta. Both lungs are clear. Multilevel degenerative changes are seen throughout the thoracic spine. IMPRESSION: No active cardiopulmonary disease. Electronically Signed   By: Suzen Dials M.D.   On: 06/11/2024 14:33    {Document cardiac monitor, telemetry assessment procedure when appropriate:32947} Procedures   Medications Ordered in the ED  sodium chloride 0.9 % bolus 500 mL (0 mLs Intravenous Stopped 06/11/24 1326)   Patient is hemodynamically stable and no longer having bleeding.   {Click here for ABCD2, HEART and other calculators REFRESH Note before signing:1}  Medical Decision Making Amount and/or Complexity of Data Reviewed Labs: ordered. Radiology: ordered. ECG/medicine tests: ordered.   Patient with rectal bleeding has stopped.  He will follow-up with GI  {Document critical care time when appropriate  Document review of labs and clinical decision tools ie CHADS2VASC2, etc  Document your independent review of radiology images and any outside records  Document your discussion with family members, caretakers and with consultants  Document social determinants of health affecting pt's care  Document your decision making why or why not admission, treatments were needed:32947:::1}   Final diagnoses:  Rectal bleeding    ED  Discharge Orders     None

## 2024-06-11 NOTE — ED Triage Notes (Signed)
 Pt BIB EMS from [facility] due to sudden onset of rectal bleeding. Pt reports about a cup of blood pooled from rectum in shower, bleeding stop shortly after. No pain. Last BM yesterday, normal. Hx of blood in urine. Ambulatory with assist. DNR  BP 130/64 RR 18 SpO2 98% CBG 169 HR 72

## 2024-07-04 DIAGNOSIS — N400 Enlarged prostate without lower urinary tract symptoms: Secondary | ICD-10-CM | POA: Diagnosis not present

## 2024-07-04 DIAGNOSIS — K8689 Other specified diseases of pancreas: Secondary | ICD-10-CM | POA: Diagnosis not present

## 2024-07-04 DIAGNOSIS — N289 Disorder of kidney and ureter, unspecified: Secondary | ICD-10-CM | POA: Diagnosis not present

## 2024-07-04 DIAGNOSIS — R310 Gross hematuria: Secondary | ICD-10-CM | POA: Diagnosis not present

## 2024-07-04 DIAGNOSIS — K7689 Other specified diseases of liver: Secondary | ICD-10-CM | POA: Diagnosis not present

## 2024-07-11 ENCOUNTER — Encounter: Payer: Self-pay | Admitting: Internal Medicine

## 2024-07-11 ENCOUNTER — Ambulatory Visit: Admitting: Internal Medicine

## 2024-07-11 VITALS — BP 118/78 | HR 68 | Temp 97.0°F | Resp 18 | Ht 64.0 in | Wt 155.3 lb

## 2024-07-11 DIAGNOSIS — K625 Hemorrhage of anus and rectum: Secondary | ICD-10-CM

## 2024-07-11 DIAGNOSIS — E1140 Type 2 diabetes mellitus with diabetic neuropathy, unspecified: Secondary | ICD-10-CM

## 2024-07-11 DIAGNOSIS — L989 Disorder of the skin and subcutaneous tissue, unspecified: Secondary | ICD-10-CM | POA: Diagnosis not present

## 2024-07-11 DIAGNOSIS — R310 Gross hematuria: Secondary | ICD-10-CM | POA: Diagnosis not present

## 2024-07-11 NOTE — Progress Notes (Unsigned)
 Location:   [facility] curator of Service:  Clinic   Provider:   Code Status: DNR Goals of Care:     06/11/2024   11:51 AM  Advanced Directives  Does Patient Have a Medical Advance Directive? Yes  Type of Advance Directive Living will;Healthcare Power of Attorney     Chief Complaint  Patient presents with   Medical Management of Chronic Issues    3 months follow-up, In addition needs to discuss Medicare Annual Wellness visit and Hemoglobin A1C     HPI: Patient is a 89 y.o. male seen today for medical management of chronic diseases.   Lives in [place] in [facility]  Wife passed away few months ago   Patient has h/o Diabetes mellitus, LE edema, Right Frozen Shoulder,And B12 def Post Prandial Dizziness Echo in the past is Normal EF  Cervical Radiculopathy with Left Arm Numbness  Was seen in ED on 06/11/24 for rectal Bleeding HGB was stable  He was discharged Patient says he has not had any other bleeding since then He stopped his aspirin   Gross Hematuria Culture was negative ? Seen by Urology Patient does not have any notes  He does not want any follow up  His only complain is Dry Eyes and itchy eyes Seeing Dr Octavia Leopard with his walker  No falls Still in [place] and managing with help of his friends  Past Medical History:  Diagnosis Date   Abdominal pain, other specified site    Allergic rhinitis due to pollen    Benign paroxysmal positional vertigo    Disturbance of skin sensation    left great toe   Elevated prostate specific antigen (PSA)    Hematuria, unspecified    Hypertrophy of prostate without urinary obstruction and other lower urinary tract symptoms (LUTS)    Impotence of organic origin    Left knee pain 05/28/2014   Macrocytosis    Other and unspecified hyperlipidemia    Other malaise and fatigue    Spermatocele    bilateral   Type II or unspecified type diabetes mellitus without mention of complication, uncontrolled    Unspecified essential  hypertension    Unspecified glaucoma(365.9)     Past Surgical History:  Procedure Laterality Date   CATARACT EXTRACTION EXTRACAPSULAR  2008   bilateraly Dr. Octavia   COLONOSCOPY  01/03/2002   normal Dr. Luis   PROSTATE BIOPSY  1995   due to elevated PSA normal   TONSILLECTOMY      Allergies  Allergen Reactions   Metformin And Related Other (See Comments)    unknown   Sulfa Antibiotics Swelling   Tetanus Toxoids Other (See Comments)    unknown   Typhoid Vaccines Other (See Comments)    unknown    Outpatient Encounter Medications as of 07/11/2024  Medication Sig   Multiple Vitamins-Minerals (PRESERVISION AREDS PO) Take 1 capsule by mouth daily.   pioglitazone  (ACTOS ) 30 MG tablet Take 1 tablet (30 mg total) by mouth daily.   sitaGLIPtin  (JANUVIA ) 100 MG tablet TAKE 1 TABLET IN THE MORNING TO CONTROL DIABETES.   [DISCONTINUED] aspirin 81 MG EC tablet Take 81 mg by mouth daily. (Patient not taking: Reported on 07/11/2024)   [DISCONTINUED] sitaGLIPtin  (JANUVIA ) 100 MG tablet TAKE 1 TABLET IN THE MORNING TO CONTROL DIABETES.   No facility-administered encounter medications on file as of 07/11/2024.    Review of Systems:  Review of Systems  Constitutional:  Negative for activity change, appetite change and unexpected  weight change.  HENT: Negative.    Eyes:  Positive for itching.  Respiratory:  Negative for cough and shortness of breath.   Cardiovascular:  Negative for leg swelling.  Gastrointestinal:  Negative for constipation.  Genitourinary:  Negative for frequency.  Musculoskeletal:  Positive for gait problem. Negative for arthralgias and myalgias.  Skin: Negative.  Negative for rash.  Neurological:  Negative for dizziness and weakness.  Psychiatric/Behavioral:  Negative for confusion and sleep disturbance.   All other systems reviewed and are negative.   Health Maintenance  Topic Date Due   Medicare Annual Wellness (AWV)  04/03/2024   HEMOGLOBIN A1C  07/04/2024    COVID-19 Vaccine (7 - 2024-25 season) 07/20/2024 (Originally 08/21/2023)   INFLUENZA VACCINE  07/20/2024   FOOT EXAM  01/10/2025   OPHTHALMOLOGY EXAM  03/29/2025   Pneumococcal Vaccine: 50+ Years  Completed   Hepatitis B Vaccines  Aged Out   HPV VACCINES  Aged Out   Meningococcal B Vaccine  Aged Out   DTaP/Tdap/Td  Discontinued   Zoster Vaccines- Shingrix  Discontinued    Physical Exam: Vitals:   07/11/24 1123  BP: 118/78  Pulse: 68  Resp: 18  Temp: (!) 97 F (36.1 C)  SpO2: 95%  Weight: 155 lb 4.8 oz (70.4 kg)  Height: 5' 4 (1.626 m)   Body mass index is 26.66 kg/m. Physical Exam Vitals reviewed.  Constitutional:      Appearance: Normal appearance.  HENT:     Head: Normocephalic.     Nose: Nose normal.     Mouth/Throat:     Mouth: Mucous membranes are moist.     Pharynx: Oropharynx is clear.  Eyes:     Pupils: Pupils are equal, round, and reactive to light.  Cardiovascular:     Rate and Rhythm: Normal rate and regular rhythm.     Pulses: Normal pulses.     Heart sounds: No murmur heard. Pulmonary:     Effort: Pulmonary effort is normal. No respiratory distress.     Breath sounds: Normal breath sounds. No rales.  Abdominal:     General: Abdomen is flat. Bowel sounds are normal.     Palpations: Abdomen is soft.  Musculoskeletal:        General: No swelling.     Cervical back: Neck supple.  Skin:    General: Skin is warm.  Neurological:     General: No focal deficit present.     Mental Status: He is alert and oriented to person, place, and time.  Psychiatric:        Mood and Affect: Mood normal.        Thought Content: Thought content normal.     Labs reviewed: Basic Metabolic Panel: Recent Labs    09/01/23 0800 01/05/24 0810 06/11/24 1225  NA 140 141 135  K 4.0 4.2 3.8  CL 102 105 100  CO2 29 28 23   GLUCOSE 137* 164* 145*  BUN 17 22 15   CREATININE 0.68* 0.73 0.73  CALCIUM 8.8 8.5* 8.7*  TSH 1.97  --   --    Liver Function Tests: Recent  Labs    09/01/23 0800 01/05/24 0810 06/11/24 1225  AST 14 14 21   ALT 7* 9 12  ALKPHOS  --   --  71  BILITOT 1.1 0.8 0.9  PROT 6.5 6.2 6.2*  ALBUMIN  --   --  3.8   No results for input(s): LIPASE, AMYLASE in the last 8760 hours. No results for input(s): AMMONIA  in the last 8760 hours. CBC: Recent Labs    09/01/23 0800 01/05/24 0810 06/11/24 1225  WBC 10.9* 5.0 7.2  NEUTROABS 8,295* 2,980 5.2  HGB 12.8* 12.4* 12.9*  HCT 37.4* 37.0* 39.0  MCV 101.4* 102.5* 104.3*  PLT 339 290 295   Lipid Panel: Recent Labs    01/05/24 0810  CHOL 141  HDL 60  LDLCALC 66  TRIG 69  CHOLHDL 2.4   Lab Results  Component Value Date   HGBA1C 8.2 (H) 01/05/2024    Procedures since last visit: No results found.  Assessment/Plan 1. Type 2 diabetes mellitus with diabetic neuropathy, without long-term current use of insulin (HCC) (Primary) Repeat A1C   2. BRBPR (bright red blood per rectum) Doe s not want any more work up Will let me know if it happens again Aspirin stopped  3. Gross hematuria It seems he went to Urology Donot have their notes Will follow if it happens again   4. Skin lesion Had small place on his scalp which was frozen by Elita It has scab which came off He can uses Triple antibiotics and Small Band aid on for now  Cognitive changes MMSE 26/30 in 04/24 Lower extremity numbness/tingling Chronic, likely secondary to diabetes. No associated pain or functional impairment.     Macrocytosis Stable HGB B 12 Normal Level Labs/tests ordered:  * No order type specified * Next appt:  10/10/2024

## 2024-07-11 NOTE — Patient Instructions (Signed)
 Labs will be done on  October 08, 2024 at [facility] at 7:45pm

## 2024-07-23 ENCOUNTER — Ambulatory Visit: Admitting: Podiatry

## 2024-08-01 ENCOUNTER — Encounter: Admitting: Internal Medicine

## 2024-08-01 NOTE — Progress Notes (Unsigned)
 Location:      Place of Service:     Provider:   Code Status: *** Goals of Care:     06/11/2024   11:51 AM  Advanced Directives  Does Patient Have a Medical Advance Directive? Yes  Type of Advance Directive Living will;Healthcare Power of Attorney     No chief complaint on file.   HPI: Patient is a 89 y.o. male seen today for medical management of chronic diseases.     Past Medical History:  Diagnosis Date   Abdominal pain, other specified site    Allergic rhinitis due to pollen    Benign paroxysmal positional vertigo    Disturbance of skin sensation    left great toe   Elevated prostate specific antigen (PSA)    Hematuria, unspecified    Hypertrophy of prostate without urinary obstruction and other lower urinary tract symptoms (LUTS)    Impotence of organic origin    Left knee pain 05/28/2014   Macrocytosis    Other and unspecified hyperlipidemia    Other malaise and fatigue    Spermatocele    bilateral   Type II or unspecified type diabetes mellitus without mention of complication, uncontrolled    Unspecified essential hypertension    Unspecified glaucoma(365.9)     Past Surgical History:  Procedure Laterality Date   CATARACT EXTRACTION EXTRACAPSULAR  2008   bilateraly Dr. Octavia   COLONOSCOPY  01/03/2002   normal Dr. Luis   PROSTATE BIOPSY  1995   due to elevated PSA normal   TONSILLECTOMY      Allergies  Allergen Reactions   Metformin And Related Other (See Comments)    unknown   Sulfa Antibiotics Swelling   Tetanus Toxoids Other (See Comments)    unknown   Typhoid Vaccines Other (See Comments)    unknown    Outpatient Encounter Medications as of 08/01/2024  Medication Sig   Multiple Vitamins-Minerals (PRESERVISION AREDS PO) Take 1 capsule by mouth daily.   pioglitazone (ACTOS) 30 MG tablet Take 1 tablet (30 mg total) by mouth daily.   sitaGLIPtin (JANUVIA) 100 MG tablet TAKE 1 TABLET IN THE MORNING TO CONTROL DIABETES.   No  facility-administered encounter medications on file as of 08/01/2024.    Review of Systems:  Review of Systems  Health Maintenance  Topic Date Due   COVID-19 Vaccine (7 - 2024-25 season) 08/21/2023   Medicare Annual Wellness (AWV)  04/03/2024   HEMOGLOBIN A1C  07/04/2024   INFLUENZA VACCINE  07/20/2024   FOOT EXAM  01/10/2025   OPHTHALMOLOGY EXAM  03/29/2025   Pneumococcal Vaccine: 50+ Years  Completed   Hepatitis B Vaccines  Aged Out   HPV VACCINES  Aged Out   Meningococcal B Vaccine  Aged Out   DTaP/Tdap/Td  Discontinued   Zoster Vaccines- Shingrix  Discontinued    Physical Exam: There were no vitals filed for this visit. There is no height or weight on file to calculate BMI. Physical Exam  Labs reviewed: Basic Metabolic Panel: Recent Labs    09/01/23 0800 01/05/24 0810 06/11/24 1225  NA 140 141 135  K 4.0 4.2 3.8  CL 102 105 100  CO2 29 28 23   GLUCOSE 137* 164* 145*  BUN 17 22 15   CREATININE 0.68* 0.73 0.73  CALCIUM 8.8 8.5* 8.7*  TSH 1.97  --   --    Liver Function Tests: Recent Labs    09/01/23 0800 01/05/24 0810 06/11/24 1225  AST 14 14 21   ALT 7*  9 12  ALKPHOS  --   --  71  BILITOT 1.1 0.8 0.9  PROT 6.5 6.2 6.2*  ALBUMIN  --   --  3.8   No results for input(s): LIPASE, AMYLASE in the last 8760 hours. No results for input(s): AMMONIA in the last 8760 hours. CBC: Recent Labs    09/01/23 0800 01/05/24 0810 06/11/24 1225  WBC 10.9* 5.0 7.2  NEUTROABS 8,295* 2,980 5.2  HGB 12.8* 12.4* 12.9*  HCT 37.4* 37.0* 39.0  MCV 101.4* 102.5* 104.3*  PLT 339 290 295   Lipid Panel: Recent Labs    01/05/24 0810  CHOL 141  HDL 60  LDLCALC 66  TRIG 69  CHOLHDL 2.4   Lab Results  Component Value Date   HGBA1C 8.2 (H) 01/05/2024    Procedures since last visit: No results found.  Assessment/Plan There are no diagnoses linked to this encounter.   Labs/tests ordered:  * No order type specified * Next appt:  10/10/2024

## 2024-08-06 DIAGNOSIS — D1801 Hemangioma of skin and subcutaneous tissue: Secondary | ICD-10-CM | POA: Diagnosis not present

## 2024-08-06 DIAGNOSIS — L570 Actinic keratosis: Secondary | ICD-10-CM | POA: Diagnosis not present

## 2024-08-06 DIAGNOSIS — L720 Epidermal cyst: Secondary | ICD-10-CM | POA: Diagnosis not present

## 2024-08-06 DIAGNOSIS — C44319 Basal cell carcinoma of skin of other parts of face: Secondary | ICD-10-CM | POA: Diagnosis not present

## 2024-08-06 DIAGNOSIS — Z85828 Personal history of other malignant neoplasm of skin: Secondary | ICD-10-CM | POA: Diagnosis not present

## 2024-08-14 DIAGNOSIS — L602 Onychogryphosis: Secondary | ICD-10-CM | POA: Diagnosis not present

## 2024-08-14 DIAGNOSIS — L84 Corns and callosities: Secondary | ICD-10-CM | POA: Diagnosis not present

## 2024-08-14 DIAGNOSIS — E1159 Type 2 diabetes mellitus with other circulatory complications: Secondary | ICD-10-CM | POA: Diagnosis not present

## 2024-09-24 ENCOUNTER — Other Ambulatory Visit: Payer: Self-pay | Admitting: Internal Medicine

## 2024-09-26 DIAGNOSIS — Z23 Encounter for immunization: Secondary | ICD-10-CM | POA: Diagnosis not present

## 2024-10-10 ENCOUNTER — Encounter: Payer: Self-pay | Admitting: Internal Medicine

## 2024-10-10 ENCOUNTER — Non-Acute Institutional Stay: Admitting: Internal Medicine

## 2024-10-10 VITALS — BP 128/76 | HR 80 | Temp 97.3°F | Resp 18 | Ht 64.0 in | Wt 159.1 lb

## 2024-10-10 DIAGNOSIS — E1140 Type 2 diabetes mellitus with diabetic neuropathy, unspecified: Secondary | ICD-10-CM

## 2024-10-10 DIAGNOSIS — M5412 Radiculopathy, cervical region: Secondary | ICD-10-CM

## 2024-10-10 DIAGNOSIS — G3184 Mild cognitive impairment, so stated: Secondary | ICD-10-CM

## 2024-10-10 NOTE — Patient Instructions (Addendum)
 Labs will be done on Monday October 15, 2024 at [facility] at 7:45 am

## 2024-10-10 NOTE — Progress Notes (Signed)
 Location:  [facility] of Service:  Clinic (12)  Provider:   Code Status: DNR Goals of Care:     10/10/2024   10:37 AM  Advanced Directives  Does Patient Have a Medical Advance Directive? Yes  Type of Estate agent of [place];Living will;Out of facility DNR (pink MOST or yellow form)  Does patient want to make changes to medical advance directive? No - Patient declined  Copy of Healthcare Power of Attorney in Chart? Yes - validated most recent copy scanned in chart (See row information)  Pre-existing out of facility DNR order (yellow form or pink MOST form) Pink MOST form placed in chart (order not valid for inpatient use)     Chief Complaint  Patient presents with   Medical Management of Chronic Issues    Three Months Follow-up with Labs    HPI: Patient is a 89 y.o. male seen today for medical management of chronic diseases.    [place]ives [place] in [facility]  Wife passed away few months ago   Patient has h/o Diabetes mellitus, LE edema, Right Frozen Shoulder,And B12 def Post Prandial Dizziness Echo in the past is Normal EF  Cervical Radiculopathy with Left Arm Numbness   Was seen in ED on 06/11/24 for rectal Bleeding HGB was stable  He was discharged He stopped his Aspirin  Uses Walker now No Falls  Discussed the use of AI scribe software for clinical note transcription with the patient, who gave verbal consent to proceed.  History of Present Illness   George [name] is a 89 year old male who presents for follow-up of rectal bleeding and skin lesions.  He experienced a previous episode of rectal bleeding but has not had any further occurrences recently.  There is no hematuria.  A previous skin lesion on his scalp healed after cryotherapy. He now has a new lesion on his chin/jaw area, similar to the previous one.  He notes swelling in his left leg, but his shoes do not feel tight by the end of the day.  He continues to take the same  medications without changes.  He has received a flu shot but not the COVID-19 vaccine. He plans to travel to [place] to visit his daughter, using a walker for the 45-minute flight from [place].      Past Medical History:  Diagnosis Date   Abdominal pain, other specified site    Allergic rhinitis due to pollen    Benign paroxysmal positional vertigo    Disturbance of skin sensation    left great toe   Elevated prostate specific antigen (PSA)    Hematuria, unspecified    Hypertrophy of prostate without urinary obstruction and other lower urinary tract symptoms (LUTS)    Impotence of organic origin    Left knee pain 05/28/2014   Macrocytosis    Other and unspecified hyperlipidemia    Other malaise and fatigue    Spermatocele    bilateral   Type II or unspecified type diabetes mellitus without mention of complication, uncontrolled    Unspecified essential hypertension    Unspecified glaucoma(365.9)     Past Surgical History:  Procedure Laterality Date   CATARACT EXTRACTION EXTRACAPSULAR  2008   bilateraly Dr. Octavia   COLONOSCOPY  01/03/2002   normal Dr. Luis   PROSTATE BIOPSY  1995   due to elevated PSA normal   TONSILLECTOMY      Allergies  Allergen Reactions   Metformin And Related  Other (See Comments)    unknown   Sulfa Antibiotics Swelling   Tetanus Toxoid-Containing Vaccines Other (See Comments)    unknown   Typhoid Vaccines Other (See Comments)    unknown    Outpatient Encounter Medications as of 10/10/2024  Medication Sig   Multiple Vitamins-Minerals (PRESERVISION AREDS PO) Take 1 capsule by mouth daily.   pioglitazone  (ACTOS ) 30 MG tablet Take 1 tablet (30 mg total) by mouth daily.   sitaGLIPtin  (JANUVIA ) 100 MG tablet TAKE 1 TABLET IN THE MORNING TO CONTROL DIABETES.   No facility-administered encounter medications on file as of 10/10/2024.    Review of Systems:  Review of Systems  Constitutional:  Negative for activity change, appetite change and  unexpected weight change.  HENT: Negative.    Respiratory:  Negative for cough and shortness of breath.   Cardiovascular:  Negative for leg swelling.  Gastrointestinal:  Negative for constipation.  Genitourinary:  Negative for frequency.  Musculoskeletal:  Positive for gait problem. Negative for arthralgias and myalgias.  Skin: Negative.  Negative for rash.  Neurological:  Negative for dizziness and weakness.  Psychiatric/Behavioral:  Positive for confusion. Negative for sleep disturbance.   All other systems reviewed and are negative.   Health Maintenance  Topic Date Due   Medicare Annual Wellness (AWV)  04/03/2024   HEMOGLOBIN A1C  07/04/2024   COVID-19 Vaccine (7 - 2025-26 season) 08/20/2024   FOOT EXAM  01/10/2025   OPHTHALMOLOGY EXAM  03/29/2025   Pneumococcal Vaccine: 50+ Years  Completed   Influenza Vaccine  Completed   Meningococcal B Vaccine  Aged Out   DTaP/Tdap/Td  Discontinued   Zoster Vaccines- Shingrix  Discontinued    Physical Exam: Vitals:   10/10/24 1029  BP: 128/76  Pulse: 80  Resp: 18  Temp: (!) 97.3 F (36.3 C)  SpO2: 96%  Weight: 159 lb 1.6 oz (72.2 kg)  Height: 5' 4 (1.626 m)   Body mass index is 27.31 kg/m. Physical Exam Vitals reviewed.  Constitutional:      Appearance: Normal appearance.  HENT:     Head: Normocephalic.     Nose: Nose normal.     Mouth/Throat:     Mouth: Mucous membranes are moist.     Pharynx: Oropharynx is clear.  Eyes:     Pupils: Pupils are equal, round, and reactive to light.  Cardiovascular:     Rate and Rhythm: Normal rate and regular rhythm.     Pulses: Normal pulses.     Heart sounds: No murmur heard. Pulmonary:     Effort: Pulmonary effort is normal. No respiratory distress.     Breath sounds: Normal breath sounds. No rales.  Abdominal:     General: Abdomen is flat. Bowel sounds are normal.     Palpations: Abdomen is soft.  Musculoskeletal:        General: Swelling present.     Cervical back: Neck  supple.  Skin:    General: Skin is warm.  Neurological:     General: No focal deficit present.     Mental Status: He is alert and oriented to person, place, and time.  Psychiatric:        Mood and Affect: Mood normal.        Thought Content: Thought content normal.     Labs reviewed: Basic Metabolic Panel: Recent Labs    01/05/24 0810 06/11/24 1225  NA 141 135  K 4.2 3.8  CL 105 100  CO2 28 23  GLUCOSE 164* 145*  BUN 22 15  CREATININE 0.73 0.73  CALCIUM 8.5* 8.7*   Liver Function Tests: Recent Labs    01/05/24 0810 06/11/24 1225  AST 14 21  ALT 9 12  ALKPHOS  --  71  BILITOT 0.8 0.9  PROT 6.2 6.2*  ALBUMIN  --  3.8   No results for input(s): LIPASE, AMYLASE in the last 8760 hours. No results for input(s): AMMONIA in the last 8760 hours. CBC: Recent Labs    01/05/24 0810 06/11/24 1225  WBC 5.0 7.2  NEUTROABS 2,980 5.2  HGB 12.4* 12.9*  HCT 37.0* 39.0  MCV 102.5* 104.3*  PLT 290 295   Lipid Panel: Recent Labs    01/05/24 0810  CHOL 141  HDL 60  LDLCALC 66  TRIG 69  CHOLHDL 2.4   Lab Results  Component Value Date   HGBA1C 8.2 (H) 01/05/2024    Procedures since last visit: No results found.  Assessment/Plan 1. Type 2 diabetes mellitus with diabetic neuropathy, without long-term current use of insulin (HCC) (Primary) Repeat Labs he forgot to do it this time       Adult Wellness Visit Routine wellness visit for a 89 year old male. Reports good sleep and vision. Recent weight gain and increased dietary salt intake noted. - Schedule blood work for next Monday. - Encourage wearing compression stockings during travel. - Advise on reducing dietary salt intake. - Recommend calcium and vitamin D supplements. - Discussed COVID-19 vaccination and advised wearing a mask in crowded places.  Lower extremity swelling Mild swelling in the left leg, possibly related to increased dietary salt intake. - Advise wearing compression stockings,  especially during flights. - Monitor and reduce dietary salt intake.  Actinic keratosis of face on the chin Actinic keratosis on the jaw, similar to previous scalp lesion. No immediate concern, advised to leave it alone unless it changes.      Cognitive changes MMSE 26/30 in 04/24 Lower extremity numbness/tingling Chronic, likely secondary to diabetes. No associated pain or functional impairment. Macrocytosis Stable HGB B 12 Normal Level   Labs/tests ordered:  * No order type specified * Next appt:  11/22/2024

## 2024-10-15 DIAGNOSIS — E1140 Type 2 diabetes mellitus with diabetic neuropathy, unspecified: Secondary | ICD-10-CM | POA: Diagnosis not present

## 2024-10-15 LAB — COMPLETE METABOLIC PANEL WITHOUT GFR
AG Ratio: 1.7 (calc) (ref 1.0–2.5)
ALT: 10 U/L (ref 9–46)
AST: 17 U/L (ref 10–35)
Albumin: 3.8 g/dL (ref 3.6–5.1)
Alkaline phosphatase (APISO): 73 U/L (ref 35–144)
BUN: 18 mg/dL (ref 7–25)
CO2: 30 mmol/L (ref 20–32)
Calcium: 8.4 mg/dL — ABNORMAL LOW (ref 8.6–10.3)
Chloride: 104 mmol/L (ref 98–110)
Creat: 0.72 mg/dL (ref 0.70–1.22)
Globulin: 2.3 g/dL (ref 1.9–3.7)
Glucose, Bld: 151 mg/dL — ABNORMAL HIGH (ref 65–99)
Potassium: 4.1 mmol/L (ref 3.5–5.3)
Sodium: 139 mmol/L (ref 135–146)
Total Bilirubin: 1 mg/dL (ref 0.2–1.2)
Total Protein: 6.1 g/dL (ref 6.1–8.1)

## 2024-10-15 LAB — CBC WITH DIFFERENTIAL/PLATELET
Absolute Lymphocytes: 1397 {cells}/uL (ref 850–3900)
Absolute Monocytes: 743 {cells}/uL (ref 200–950)
Basophils Absolute: 83 {cells}/uL (ref 0–200)
Basophils Relative: 1.5 %
Eosinophils Absolute: 143 {cells}/uL (ref 15–500)
Eosinophils Relative: 2.6 %
HCT: 36.4 % — ABNORMAL LOW (ref 38.5–50.0)
Hemoglobin: 12.1 g/dL — ABNORMAL LOW (ref 13.2–17.1)
MCH: 34.2 pg — ABNORMAL HIGH (ref 27.0–33.0)
MCHC: 33.2 g/dL (ref 32.0–36.0)
MCV: 102.8 fL — ABNORMAL HIGH (ref 80.0–100.0)
MPV: 10 fL (ref 7.5–12.5)
Monocytes Relative: 13.5 %
Neutro Abs: 3135 {cells}/uL (ref 1500–7800)
Neutrophils Relative %: 57 %
Platelets: 270 Thousand/uL (ref 140–400)
RBC: 3.54 Million/uL — ABNORMAL LOW (ref 4.20–5.80)
RDW: 20 % — ABNORMAL HIGH (ref 11.0–15.0)
Total Lymphocyte: 25.4 %
WBC: 5.5 Thousand/uL (ref 3.8–10.8)

## 2024-10-15 LAB — HEMOGLOBIN A1C
Hgb A1c MFr Bld: 7.9 % — ABNORMAL HIGH (ref <5.7)
Mean Plasma Glucose: 180 mg/dL
eAG (mmol/L): 10 mmol/L

## 2024-10-17 ENCOUNTER — Ambulatory Visit: Payer: Self-pay | Admitting: Internal Medicine

## 2024-10-23 DIAGNOSIS — L820 Inflamed seborrheic keratosis: Secondary | ICD-10-CM | POA: Diagnosis not present

## 2024-10-23 DIAGNOSIS — Z85828 Personal history of other malignant neoplasm of skin: Secondary | ICD-10-CM | POA: Diagnosis not present

## 2024-10-23 DIAGNOSIS — D485 Neoplasm of uncertain behavior of skin: Secondary | ICD-10-CM | POA: Diagnosis not present

## 2024-10-23 DIAGNOSIS — L570 Actinic keratosis: Secondary | ICD-10-CM | POA: Diagnosis not present

## 2024-11-21 DIAGNOSIS — H0102A Squamous blepharitis right eye, upper and lower eyelids: Secondary | ICD-10-CM | POA: Diagnosis not present

## 2024-11-21 DIAGNOSIS — H1045 Other chronic allergic conjunctivitis: Secondary | ICD-10-CM | POA: Diagnosis not present

## 2024-11-21 DIAGNOSIS — H0102B Squamous blepharitis left eye, upper and lower eyelids: Secondary | ICD-10-CM | POA: Diagnosis not present

## 2024-11-22 ENCOUNTER — Encounter: Payer: Self-pay | Admitting: Nurse Practitioner

## 2024-11-27 DIAGNOSIS — L84 Corns and callosities: Secondary | ICD-10-CM | POA: Diagnosis not present

## 2024-11-27 DIAGNOSIS — L602 Onychogryphosis: Secondary | ICD-10-CM | POA: Diagnosis not present

## 2024-11-27 DIAGNOSIS — E1159 Type 2 diabetes mellitus with other circulatory complications: Secondary | ICD-10-CM | POA: Diagnosis not present

## 2024-11-28 ENCOUNTER — Encounter: Admitting: Internal Medicine

## 2025-01-23 ENCOUNTER — Non-Acute Institutional Stay: Payer: Self-pay | Admitting: Internal Medicine

## 2025-01-23 ENCOUNTER — Encounter: Payer: Self-pay | Admitting: Internal Medicine

## 2025-01-23 VITALS — BP 132/66 | HR 100 | Temp 97.4°F | Resp 18 | Ht 64.0 in | Wt 157.6 lb

## 2025-01-23 DIAGNOSIS — E1140 Type 2 diabetes mellitus with diabetic neuropathy, unspecified: Secondary | ICD-10-CM

## 2025-01-23 DIAGNOSIS — M5412 Radiculopathy, cervical region: Secondary | ICD-10-CM

## 2025-01-23 DIAGNOSIS — G8929 Other chronic pain: Secondary | ICD-10-CM

## 2025-01-23 NOTE — Patient Instructions (Signed)
 Labs will be done on March 2nd at [facility] at 7:45 am

## 2025-01-25 NOTE — Progress Notes (Signed)
 "  Location:  [facility] Service:  Clinic (12)  Provider:   Code Status:  Goals of Care:     10/10/2024   10:37 AM  Advanced Directives  Does Patient Have a Medical Advance Directive? Yes  Type of Estate Agent of [place];Living will;Out of facility DNR (pink MOST or yellow form)  Does patient want to make changes to medical advance directive? No - Patient declined  Copy of Healthcare Power of Attorney in Chart? Yes - validated most recent copy scanned in chart (See row information)  Pre-existing out of facility DNR order (yellow form or pink MOST form) Pink MOST form placed in chart (order not valid for inpatient use)     Chief Complaint  Patient presents with   Medical Management of Chronic Issues    Four Months Follow-up     HPI: Patient is a 89 y.o. male seen today for medical management of chronic diseases.    [place]ives [place] in [facility]   Patient is doing well  His wife passed away a year ago Neighbor helps him  One daughter in [place]  Discussed the use of AI scribe software for clinical note transcription with the patient, who gave verbal consent to proceed.  History of Present Illness   DANIAL HLAVAC is a 89 year old male with arthritis who presents with knee pain. Right Knee pain He has had intermittent mild soreness in his knee since Sunday without injury, swelling, or instability. He has not taken Tylenol  for this episode, though it usually helps when used.  He remains active and independent in managing his affairs. He uses a walker for mobility outside his apartment and does not need it inside.  He has regular support from a neighbor who assists with laundry and checks on him, and his daughter lives out of state.  He reports no dizziness, falls, weight change, bowel problems, nocturia beyond once nightly, new skin lesions, or bleeding.      Past Medical History:  Diagnosis Date   Abdominal pain, other specified site     Allergic rhinitis due to pollen    Benign paroxysmal positional vertigo    Disturbance of skin sensation    left great toe   Elevated prostate specific antigen (PSA)    Hematuria, unspecified    Hypertrophy of prostate without urinary obstruction and other lower urinary tract symptoms (LUTS)    Impotence of organic origin    Left knee pain 05/28/2014   Macrocytosis    Other and unspecified hyperlipidemia    Other malaise and fatigue    Spermatocele    bilateral   Type II or unspecified type diabetes mellitus without mention of complication, uncontrolled    Unspecified essential hypertension    Unspecified glaucoma(365.9)     Past Surgical History:  Procedure Laterality Date   CATARACT EXTRACTION EXTRACAPSULAR  2008   bilateraly Dr. Octavia   COLONOSCOPY  01/03/2002   normal Dr. Luis   PROSTATE BIOPSY  1995   due to elevated PSA normal   TONSILLECTOMY      Allergies[1]  Outpatient Encounter Medications as of 01/23/2025  Medication Sig   Multiple Vitamins-Minerals (PRESERVISION AREDS PO) Take 1 capsule by mouth daily.   pioglitazone  (ACTOS ) 30 MG tablet Take 1 tablet (30 mg total) by mouth daily.   sitaGLIPtin  (JANUVIA ) 100 MG tablet TAKE 1 TABLET IN THE MORNING TO CONTROL DIABETES.   No facility-administered encounter medications on file as of  01/23/2025.    Review of Systems:  Review of Systems  Constitutional:  Negative for activity change, appetite change and unexpected weight change.  HENT: Negative.    Respiratory:  Negative for cough and shortness of breath.   Cardiovascular:  Negative for leg swelling.  Gastrointestinal:  Negative for constipation.  Genitourinary:  Negative for frequency.  Musculoskeletal:  Positive for arthralgias and gait problem. Negative for myalgias.  Skin: Negative.  Negative for rash.  Neurological:  Negative for dizziness and weakness.  Psychiatric/Behavioral:  Negative for confusion and sleep disturbance.   All other systems reviewed and  are negative.   Health Maintenance  Topic Date Due   Medicare Annual Wellness (AWV)  04/03/2024   FOOT EXAM  01/10/2025   COVID-19 Vaccine (6 - 2025-26 season) 08/05/2025 (Originally 08/20/2024)   OPHTHALMOLOGY EXAM  03/29/2025   HEMOGLOBIN A1C  04/15/2025   Pneumococcal Vaccine: 50+ Years  Completed   Influenza Vaccine  Completed   Meningococcal B Vaccine  Aged Out   DTaP/Tdap/Td  Discontinued   Zoster Vaccines- Shingrix  Discontinued    Physical Exam: Vitals:   01/23/25 1028  BP: 132/66  Pulse: 100  Resp: 18  Temp: (!) 97.4 F (36.3 C)  SpO2: 96%  Weight: 157 lb 9.6 oz (71.5 kg)  Height: 5' 4 (1.626 m)   Body mass index is 27.05 kg/m. Physical Exam Vitals reviewed.  Constitutional:      Appearance: Normal appearance.  HENT:     Head: Normocephalic.     Nose: Nose normal.     Mouth/Throat:     Mouth: Mucous membranes are moist.     Pharynx: Oropharynx is clear.  Eyes:     Pupils: Pupils are equal, round, and reactive to light.  Cardiovascular:     Rate and Rhythm: Normal rate and regular rhythm.     Pulses: Normal pulses.     Heart sounds: No murmur heard. Pulmonary:     Effort: Pulmonary effort is normal. No respiratory distress.     Breath sounds: Normal breath sounds. No rales.  Abdominal:     General: Abdomen is flat. Bowel sounds are normal.     Palpations: Abdomen is soft.  Musculoskeletal:        General: No swelling.     Cervical back: Neck supple.     Comments: Knee exam No Swelling or redness or Pain today Does have crepitus  Skin:    General: Skin is warm.  Neurological:     General: No focal deficit present.     Mental Status: He is alert and oriented to person, place, and time.  Psychiatric:        Mood and Affect: Mood normal.        Thought Content: Thought content normal.     Labs reviewed: Basic Metabolic Panel: Recent Labs    06/11/24 1225 10/15/24 0800  NA 135 139  K 3.8 4.1  CL 100 104  CO2 23 30  GLUCOSE 145* 151*   BUN 15 18  CREATININE 0.73 0.72  CALCIUM 8.7* 8.4*   Liver Function Tests: Recent Labs    06/11/24 1225 10/15/24 0800  AST 21 17  ALT 12 10  ALKPHOS 71  --   BILITOT 0.9 1.0  PROT 6.2* 6.1  ALBUMIN 3.8  --    No results for input(s): LIPASE, AMYLASE in the last 8760 hours. No results for input(s): AMMONIA in the last 8760 hours. CBC: Recent Labs    06/11/24 1225  10/15/24 0800  WBC 7.2 5.5  NEUTROABS 5.2 3,135  HGB 12.9* 12.1*  HCT 39.0 36.4*  MCV 104.3* 102.8*  PLT 295 270   Lipid Panel: No results for input(s): CHOL, HDL, LDLCALC, TRIG, CHOLHDL, LDLDIRECT in the last 8760 hours. Lab Results  Component Value Date   HGBA1C 7.9 (H) 10/15/2024    Procedures since last visit: No results found.  Assessment/Plan 1. Type 2 diabetes mellitus with diabetic neuropathy, without long-term current use of insulin (HCC) (Primary)   2. Chronic pain of right knee Exam is benign Most likely Ostearthritis Tylenol  PRN Does not want Ortho or injection  3. Cervical radiculopathy   Cognitive changes MMSE 26/30 in 04/24 Lower extremity numbness/tingling Chronic, likely secondary to diabetes. No associated pain or functional impairment. Macrocytosis Stable HGB B 12 Normal Level            Labs/tests ordered:  * No order type specified * Next appt:  03/21/2025         [1]  Allergies Allergen Reactions   Metformin And Related Other (See Comments)    unknown   Sulfa Antibiotics Swelling   Tetanus Toxoid-Containing Vaccines Other (See Comments)    unknown   Typhoid Vaccines Other (See Comments)    unknown   "

## 2025-03-21 ENCOUNTER — Encounter: Admitting: Nurse Practitioner

## 2025-05-22 ENCOUNTER — Encounter: Admitting: Internal Medicine
